# Patient Record
Sex: Male | Born: 1960 | Race: White | Hispanic: No | Marital: Married | Smoking: Never smoker
Health system: Southern US, Community
[De-identification: ages and names within clinical notes are randomized; demographics above are authoritative.]

## PROBLEM LIST (undated history)

## (undated) DIAGNOSIS — M1990 Unspecified osteoarthritis, unspecified site: Secondary | ICD-10-CM

## (undated) DIAGNOSIS — I10 Essential (primary) hypertension: Secondary | ICD-10-CM

## (undated) DIAGNOSIS — E119 Type 2 diabetes mellitus without complications: Secondary | ICD-10-CM

## (undated) HISTORY — DX: Essential (primary) hypertension: I10

## (undated) HISTORY — DX: Type 2 diabetes mellitus without complications: E11.9

## (undated) HISTORY — PX: KNEE SURGERY: SHX244

## (undated) HISTORY — PX: CARPAL TUNNEL RELEASE: SHX101

## (undated) HISTORY — PX: BACK SURGERY: SHX140

## (undated) HISTORY — PX: TOTAL KNEE ARTHROPLASTY: SHX125

---

## 2001-06-27 ENCOUNTER — Encounter (INDEPENDENT_AMBULATORY_CARE_PROVIDER_SITE_OTHER): Payer: Self-pay | Admitting: Specialist

## 2001-06-27 ENCOUNTER — Observation Stay (HOSPITAL_COMMUNITY): Admission: RE | Admit: 2001-06-27 | Discharge: 2001-06-28 | Payer: Self-pay | Admitting: Orthopedic Surgery

## 2003-11-11 ENCOUNTER — Ambulatory Visit (HOSPITAL_BASED_OUTPATIENT_CLINIC_OR_DEPARTMENT_OTHER): Admission: RE | Admit: 2003-11-11 | Discharge: 2003-11-11 | Payer: Self-pay | Admitting: Orthopedic Surgery

## 2003-11-11 ENCOUNTER — Ambulatory Visit (HOSPITAL_COMMUNITY): Admission: RE | Admit: 2003-11-11 | Discharge: 2003-11-11 | Payer: Self-pay | Admitting: Orthopedic Surgery

## 2005-01-12 ENCOUNTER — Emergency Department (HOSPITAL_COMMUNITY): Admission: EM | Admit: 2005-01-12 | Discharge: 2005-01-12 | Payer: Self-pay | Admitting: Family Medicine

## 2006-07-12 ENCOUNTER — Emergency Department (HOSPITAL_COMMUNITY): Admission: EM | Admit: 2006-07-12 | Discharge: 2006-07-12 | Payer: Self-pay | Admitting: Emergency Medicine

## 2006-08-02 ENCOUNTER — Emergency Department (HOSPITAL_COMMUNITY): Admission: EM | Admit: 2006-08-02 | Discharge: 2006-08-02 | Payer: Self-pay | Admitting: Emergency Medicine

## 2006-12-21 ENCOUNTER — Encounter: Admission: RE | Admit: 2006-12-21 | Discharge: 2006-12-21 | Payer: Self-pay | Admitting: Orthopedic Surgery

## 2006-12-27 ENCOUNTER — Ambulatory Visit (HOSPITAL_COMMUNITY): Admission: RE | Admit: 2006-12-27 | Discharge: 2006-12-28 | Payer: Self-pay | Admitting: Orthopedic Surgery

## 2008-10-10 ENCOUNTER — Emergency Department (HOSPITAL_BASED_OUTPATIENT_CLINIC_OR_DEPARTMENT_OTHER): Admission: EM | Admit: 2008-10-10 | Discharge: 2008-10-10 | Payer: Self-pay | Admitting: Emergency Medicine

## 2008-10-10 ENCOUNTER — Ambulatory Visit: Payer: Self-pay | Admitting: Diagnostic Radiology

## 2009-07-13 ENCOUNTER — Inpatient Hospital Stay (HOSPITAL_COMMUNITY): Admission: RE | Admit: 2009-07-13 | Discharge: 2009-07-17 | Payer: Self-pay | Admitting: Orthopedic Surgery

## 2010-02-11 ENCOUNTER — Inpatient Hospital Stay (HOSPITAL_COMMUNITY)
Admission: RE | Admit: 2010-02-11 | Discharge: 2010-02-16 | Payer: Self-pay | Source: Home / Self Care | Attending: Orthopedic Surgery | Admitting: Orthopedic Surgery

## 2010-02-11 LAB — TYPE AND SCREEN
ABO/RH(D): A POS
Antibody Screen: NEGATIVE

## 2010-02-21 LAB — BASIC METABOLIC PANEL
BUN: 9 mg/dL (ref 6–23)
CO2: 29 mEq/L (ref 19–32)
Calcium: 8.8 mg/dL (ref 8.4–10.5)
Chloride: 97 mEq/L (ref 96–112)
Creatinine, Ser: 0.99 mg/dL (ref 0.4–1.5)
GFR calc Af Amer: >60 mL/min (ref >60)
GFR calc non Af Amer: >60 mL/min (ref >60)
Glucose, Bld: 112 mg/dL — ABNORMAL HIGH (ref 70–99)
Potassium: 3.9 mEq/L (ref 3.5–5.1)
Sodium: 135 mEq/L (ref 135–145)

## 2010-02-21 LAB — CBC
HCT: 38.8 % — ABNORMAL LOW (ref 39.0–52.0)
HCT: 39.2 % (ref 39.0–52.0)
HCT: 35.2 % — ABNORMAL LOW (ref 39.0–52.0)
Hemoglobin: 12.9 g/dL — ABNORMAL LOW (ref 13.0–17.0)
Hemoglobin: 12.7 g/dL — ABNORMAL LOW (ref 13.0–17.0)
Hemoglobin: 11.8 g/dL — ABNORMAL LOW (ref 13.0–17.0)
MCH: 29 pg (ref 26.0–34.0)
MCH: 28.5 pg (ref 26.0–34.0)
MCH: 28.8 pg (ref 26.0–34.0)
MCHC: 33.2 g/dL (ref 30.0–36.0)
MCHC: 32.4 g/dL (ref 30.0–36.0)
MCHC: 33.5 g/dL (ref 30.0–36.0)
MCV: 87.2 fL (ref 78.0–100.0)
MCV: 87.9 fL (ref 78.0–100.0)
MCV: 85.9 fL (ref 78.0–100.0)
Platelets: 201 10*3/uL (ref 150–400)
Platelets: 174 10*3/uL (ref 150–400)
Platelets: 179 10*3/uL (ref 150–400)
RBC: 4.45 MIL/uL (ref 4.22–5.81)
RBC: 4.46 MIL/uL (ref 4.22–5.81)
RBC: 4.1 MIL/uL — ABNORMAL LOW (ref 4.22–5.81)
RDW: 13.7 % (ref 11.5–15.5)
RDW: 13.7 % (ref 11.5–15.5)
RDW: 13.7 % (ref 11.5–15.5)
WBC: 13 10*3/uL — ABNORMAL HIGH (ref 4.0–10.5)
WBC: 14 10*3/uL — ABNORMAL HIGH (ref 4.0–10.5)
WBC: 15.3 10*3/uL — ABNORMAL HIGH (ref 4.0–10.5)

## 2010-02-21 LAB — PROTIME-INR
INR: 1.15 (ref 0.00–1.49)
INR: 1.17 (ref 0.00–1.49)
INR: 1.52 — ABNORMAL HIGH (ref 0.00–1.49)
INR: 1.74 — ABNORMAL HIGH (ref 0.00–1.49)
INR: 1.92 — ABNORMAL HIGH (ref 0.00–1.49)
INR: 2.58 — ABNORMAL HIGH (ref 0.00–1.49)
Prothrombin Time: 14.9 seconds (ref 11.6–15.2)
Prothrombin Time: 15.1 seconds (ref 11.6–15.2)
Prothrombin Time: 18.5 seconds — ABNORMAL HIGH (ref 11.6–15.2)
Prothrombin Time: 20.5 seconds — ABNORMAL HIGH (ref 11.6–15.2)
Prothrombin Time: 22.1 seconds — ABNORMAL HIGH (ref 11.6–15.2)
Prothrombin Time: 27.8 seconds — ABNORMAL HIGH (ref 11.6–15.2)

## 2010-03-14 NOTE — Discharge Summary (Signed)
Seth Larsen, Seth Larsen               ACCOUNT NO.:  [id number]  MEDICAL RECORD NO.:  [id number]          PATIENT TYPE:  INP  LOCATION:  1617                         FACILITY:  [facility]  PHYSICIAN:  Marlowe Kays, M.D.  DATE OF BIRTH:  November 30, 1960  DATE OF ADMISSION:  02/11/2010 DATE OF DISCHARGE:  02/16/2010                              DISCHARGE SUMMARY   ADMITTING DIAGNOSES:  Osteoarthritis of the right knee.  DISCHARGE DIAGNOSIS: 1. Osteoarthritis of the right knee. 2. Mild postoperative anemia.  OPERATION:  On February 11, 2009, the patient underwent an [employer] total knee replacement arthroplasty of the right knee.  Dr. Georges Lynch. Gioffre assisted.  BRIEF HISTORY:  This 50 year old male has had problems concerning his right knee.  He had an arthroplasty done in June of last year and did very well with that.  His left knee is doing well after his arthroscopy; however, his right knee is interfering with his day-to-day activities. He has lost flexion in the knee and can only flex to about 90 degrees. He has used muscle relaxants, anti-inflammatories with analgesics, which really has not helped at all.  We used viscosupplementation as one of our conservative care measures, but unfortunately, this did not help.  X- rays have shown near bone-on-bone deformities.  After much discussion, including the risks and benefits of the surgery, we decided to go ahead with the above procedure.  COURSE IN HOSPITAL:  The patient tolerated the surgical procedure quite well.  He was placed on CPM machine postoperatively, which he tolerated quite well.  He was very eager to enter into his physical therapy regimen postoperatively for rehabilitation of his knee.  We did have a little difficulty with some pain control.  We had to adjust medications. Eventually with time and changing the medication helped him postoperatively.  Physical therapy worked very diligently with the patient in the total knee  protocol.  He was placed on Coumadin protocol after receiving Lovenox postoperatively for prevention of DVT.  His wound remained clean and dry.  He was afebrile.  Once we had his pain under control and he was comfortable and confident with his knee and able to ambulate, his IV analgesics were discontinued.  On the day of discharge,  Dr. Simonne Come saw the patient, and he had much less pain and discomfort.  Home arrangements have been made for home health.  Dr. Simonne Come discharged him home on Coumadin to continue with the Coumadin protocol,  Tylox for discomfort, Robaxin as a muscle relaxant, and Zofran for any nausea. Laboratory values in the hospital hematologically showed a preoperative CBC completely within normal limits.  Hemoglobin 12.9, hematocrit 38.8. Final hemoglobin was 11.8 and hematocrit was 35.2.  Blood chemistries were within normal limits with a sodium of 135, potassium of 3.9.  The MRSA screen was negative.  CONDITION ON DISCHARGE:  Improved, stable.  PLAN:  The patient discharged to his home in the care of his family to continue with home health and total knee protocol.  His medications that he had at home preoperatively,  hydrocodone, ibuprofen, multivitamins and meloxicam will not be restarted postoperatively due to the fact  that he is on Coumadin protocol.  We might like to have him back in the office in 2 weeks after the date of surgery.  On the date of surgery, his wound was clean and dry.  Staples were intact. Neurovascular was intact to the operative extremity.  He is told should there be any changes in the above, he is to give [place] a call prior to him coming back to the office in 2 weeks.     Dooley L. Cherlynn June.   ______________________________ Marlowe Kays, M.D.    DLU/MEDQ  D:  03/02/2010  T:  03/02/2010  Job:  [id number]  Electronically Signed by Marlowe Kays M.D. on 03/14/2010 02:47:40 PM

## 2010-04-18 LAB — SURGICAL PCR SCREEN
MRSA, PCR: NEGATIVE
Staphylococcus aureus: NEGATIVE

## 2010-04-25 LAB — TYPE AND SCREEN
ABO/RH(D): A POS
Antibody Screen: NEGATIVE

## 2010-04-25 LAB — PROTIME-INR
INR: 1.12 (ref 0.00–1.49)
INR: 1.14 (ref 0.00–1.49)
INR: 1.37 (ref 0.00–1.49)
INR: 2.17 — ABNORMAL HIGH (ref 0.00–1.49)
Prothrombin Time: 14.3 seconds (ref 11.6–15.2)
Prothrombin Time: 14.5 seconds (ref 11.6–15.2)
Prothrombin Time: 24 seconds — ABNORMAL HIGH (ref 11.6–15.2)

## 2010-04-25 LAB — BASIC METABOLIC PANEL
Calcium: 8.3 mg/dL — ABNORMAL LOW (ref 8.4–10.5)
Calcium: 8.5 mg/dL (ref 8.4–10.5)
Chloride: 98 mEq/L (ref 96–112)
Creatinine, Ser: 0.81 mg/dL (ref 0.4–1.5)
GFR calc Af Amer: >60 mL/min (ref >60)
GFR calc Af Amer: >60 mL/min (ref >60)
GFR calc non Af Amer: >60 mL/min (ref >60)
Sodium: 133 mEq/L — ABNORMAL LOW (ref 135–145)

## 2010-04-25 LAB — CBC
HCT: 33.4 % — ABNORMAL LOW (ref 39.0–52.0)
Hemoglobin: 11.5 g/dL — ABNORMAL LOW (ref 13.0–17.0)
Hemoglobin: 12 g/dL — ABNORMAL LOW (ref 13.0–17.0)
Platelets: 157 10*3/uL (ref 150–400)
RBC: 4.01 MIL/uL — ABNORMAL LOW (ref 4.22–5.81)
RBC: 4.36 MIL/uL (ref 4.22–5.81)
WBC: 13.1 10*3/uL — ABNORMAL HIGH (ref 4.0–10.5)
WBC: 15.5 10*3/uL — ABNORMAL HIGH (ref 4.0–10.5)
WBC: 15.6 10*3/uL — ABNORMAL HIGH (ref 4.0–10.5)

## 2010-04-25 LAB — HEMOGLOBIN AND HEMATOCRIT, BLOOD: HCT: 46.2 % (ref 39.0–52.0)

## 2010-04-25 LAB — ABO/RH: ABO/RH(D): A POS

## 2010-06-21 NOTE — Op Note (Signed)
NAME:  Seth Larsen, Seth Larsen               ACCOUNT NO.:  [id number]   MEDICAL RECORD NO.:  [id number]          PATIENT TYPE:  OIB   LOCATION:  1613                         FACILITY:  [facility]   PHYSICIAN:  Marlowe Kays, M.D.  DATE OF BIRTH:  09/07/1960   DATE OF PROCEDURE:  12/27/2006  DATE OF DISCHARGE:                               OPERATIVE REPORT   PREOPERATIVE DIAGNOSES:  Spinal stenosis, L3-4, and lateral recess  stenosis and possible disk protrusion at L4-5 right.   POSTOPERATIVE DIAGNOSES:  Spinal stenosis, L3-4, and lateral recess  stenosis and possible disk protrusion at L4-5 right.   OPERATIONS:  Central and foraminal decompression at L3-4 and L4-5 right.   SURGEON:  Marlowe Kays, M.D.   ASSISTANT:  Georges Lynch. Darrelyn Hillock, M.D.   ANESTHESIA:  General.   PATHOLOGY AND JUSTIFICATION FOR PROCEDURE:  He has had a history of two  laminectomies at L4-5 on the right in 1983 and 1991.  Recently has  developed severe right leg pain which has become progressively more  severe, only partially controlled by narcotics.  An MRI has demonstrated  postoperative changes at L5-S1 on the left where he has also had  previous surgery and at L4-5 on the right with what appeared to be  recurrent disk bulge at that level and some lateral recess stenosis.  The dominant finding on the MRI appeared to the spinal stenosis at L3-4.  Accordingly, a myelogram CT scan was performed which confirmed the  significant spinal stenosis at L3-4 with the defect more on the right at  L3-4 than the left and also some cut-off of the L5 nerve root on the  right.  Accordingly, it was felt that the above-mentioned surgery was  indicated.   PROCEDURE:  Prophylactic antibiotics, satisfactory general anesthesia,  knee-chest position on the Luverne frame.  Back was prepped with  DuraPrep, draped in a sterile field, Ioban employed.  Time-out  performed.  I excised the superior portion of the old scar and extended  the  incision slightly cephalad, tagging the spinous process at this  level, which we thought would probably be L4, and also placed Penfield #  hemostat distally at the inferior portion of the incision.  We took our  initial lateral, x-ray indicating that the hook was actually on L3 and  the distal Penfield at the interval between L4 and L5, almost at the  interspace.  Accordingly, I extended the incision slightly proximally  and distalward so that most of the entire L2-3 interspace was identified  as was well as the level of the L4-5 disk.  We then placed two self-  retaining McCullough retractors.  With a double-action rongeur I removed  the spinous process and neural arch of L3 and a major portion of L4  until we got down to the ligamentum flavum and close to the spinal canal  and then we continued working with 2 and 3-mm Kerrison rongeurs.  The  spinal canal was extremely tight at L3-4.  When the dissection became  more difficult, we brought in the microscope and completed the  decompression until he was  well-decompressed, cephalad caudad and  laterally.  We took a final x-ray, a lateral x-ray confirming that we  __________  gone above L3 and distal to the L4-5 disk space.  It was  felt we identified the L5 nerve root.  There was a good bit of scar  present, but we were at the level of the disk space and it was firm.  Also, the foramen for the L5 nerve root was patent to hockey stick on  the right side.  Satisfied that we had completed the decompression, the  wound was irrigated well with sterile saline and Gelfoam soaked in  thrombin was placed over the dura.  The self-retaining retractors were  carefully removed and the wound closed in layers with interrupted #1  Vicryl in the fascia leaving a distal aperture of about a centimeter and  a half for any drainage.  The subcutaneous tissue was closed with #1  Vicryl deep and superficially with 2-0 Vicryl, and the skin with  staples.  Betadine  and Adaptic dry sterile were applied.  He tolerated  the procedure well and was gently placed on his PACU bed and taken to  recovery in satisfactory condition with no known complications.  Estimated blood loss was perhaps 250 mL, no blood replacement.           ______________________________  Marlowe Kays, M.D.     JA/MEDQ  D:  12/27/2006  T:  12/28/2006  Job:  [id number]

## 2010-06-24 NOTE — Op Note (Signed)
NAME:  Seth Larsen, Seth Larsen               ACCOUNT NO.:  [id number]   MEDICAL RECORD NO.:  [id number]          PATIENT TYPE:  AMB   LOCATION:  DSC                          FACILITY:  [facility]   PHYSICIAN:  Marlowe Kays, M.D.  DATE OF BIRTH:  07-15-1960   DATE OF PROCEDURE:  11/11/2003  DATE OF DISCHARGE:                                 OPERATIVE REPORT   PREOPERATIVE DIAGNOSES:  1.  Torn medial meniscus.  2.  Degenerative arthritis medial compartment of the knee joint.   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   OPERATION PERFORMED:  Left knee arthroscopy with incidental shaving of  medial meniscus and debridement of medial femoral condyle and patella.   SURGEON:  Marlowe Kays, M.D.   ASSISTANT:  Nurse.   ANESTHESIA:  General.   INDICATIONS FOR PROCEDURE:  Because of persistent pain in the inner aspect  of his left knee, I obtained an MRI which was performed on October 21, 2003 and showed a posterior horn tear of the medial meniscus with medial  displacement.  There was also come medial compartment arthritis noted.  Because of the medial meniscal tear he is here today for the arthroscopic  procedure.  See operative description below for additional details on  pathology.   DESCRIPTION OF PROCEDURE:  Ace wrap to right lower extremity and padding on  the right knee.  Pneumatic tourniquet with the left leg Esmarched out  sterilely.  Thigh stabilizer.  Left leg was prepped with DuraPrep from  stabilizer to ankle and draped in sterile field.  Superomedial saline  inflow.  First through anterolateral portal, the medial compartment of knee  joint was evaluated, had some synovitis medially which I resected.  He had a  defect in the midportion of the medial tibial plateau I would rate grade 2/4  which I attempted to debride but was  really not debridable.  He had grade  3/4 chondromalacia over most of the medial femoral condyle which I shaved  down until smooth.  There was some slight fraying of the inner border of the  medial meniscus which I incidentally smoothed down.  There was no frank  tear.  I looked both on top of the medial meniscus and on the underneath  surface back to the posteromedial curve where the tear was described.  There  was a little synovitis around the posterior rim of the medial tibial plateau  but this did not involve the medial meniscus.  When I satisfied myself that  there was no medial meniscus tear, I looked up in the medial gutter and  suprapatellar area.  He had grade 3/4 chondromalacia of the midportion of  the patella which I was unable to get with my shaver from the medial  compartment of the knee joint.  Accordingly, I reversed portals and was able  to shave down the patella until smooth through the anterolateral portal.  Medial compartment of the knee joint looked unremarkable.  Representative  pictures were taken.  The knee joint was irrigated until clear.  All fluid  possible removed.  The two anterior portals were closed with 4-0 nylon.  20  mL of 0.5% Marcaine with Adrenalin with 4 mg of morphine were then instilled  through the inflow apparatus which  was then removed and this portal closed with 4-0 nylon as well.  Betadine  Adaptic, dry sterile dressing were applied.  Tourniquet released.  The  patient tolerated the procedure well and was taken to recovery room in  satisfactory condition with no known complications.       JA/MEDQ  D:  11/11/2003  T:  11/11/2003  Job:  [id number]

## 2010-06-24 NOTE — Op Note (Signed)
[facility]  Patient:    Seth Larsen, Seth Larsen Visit Number: [id number] MRN: [id number]          Service Type: SUR Location: 4W [id number] [id number] Attending Physician:  Marlowe Kays Page Dictated by:   Illene Labrador. Aplington, M.D. Proc. Date: 06/27/01 Admit Date:  06/27/2001 Discharge Date: 06/28/2001                             Operative Report  PREOPERATIVE DIAGNOSES:  1. Large lipoma, right posterior thorax.  2. Painful degenerative arthritis, right AC joint.  3. 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  4. Labral disruption.  POSTOPERATIVE DIAGNOSES:  1. Large lipoma, right posterior thorax.  2. Painful degenerative arthritis, right AC joint.  3. 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  4. Labral disruption.  OPERATION PERFORMED:  1. Excision of large lipoma, right posterior thorax.  2. Open resection, distal right clavicle.  3. Right shoulder arthroscopy with a) debridement of labral disruption,     b) arthroscopic subacromial decompression.  SURGEON:  Illene Labrador. Aplington, M.D.  ASSISTANT:  Marcie Bal. Troncale, P.A.C.  ANESTHESIA:  General.  PATHOLOGY AND JUSTIFICATION FOR PROCEDURE:  Both the lipoma on his back and his right shoulder were very painful. MRI demonstrated what appeared to be a benign looking lipoma in his back, and significant degenerative arthritis with edema at the [facility] joint and rotator cuff disruption with labral disruption near the biceps tendon, all of this was confirmed at surgery.  DESCRIPTION OF PROCEDURE:  Satisfactory general anesthesia, the patient was placed in the prone position and the area of the lipoma of the back was prepped with duraprep, draped out with four towels and Ioban and then a lap sheet. The mass was infiltrated with 0.5% Marcaine with adrenaline, a transverse incision was made down to the subcutaneous tissue. The deep subcutaneous tissue was all matted and stuck down  to the underlying lipoma due to chronic irritation and inflammation presumably. I dissected out the perimeter of the lipoma which did appear to be benign and I had to dissect it off the deep muscle. It was adherent but not invading the muscle. More inferiorly, the inflamed type tissue in the deep subcutaneous tissue was adherent to the lipoma and was taken out with it as well. The final specimen was in two pieces with a total of 9 cm in diameter. Minor bleeders were coagulated and the wound was dry on closure. I felt that there was not a lot of dead space since I could contain a deep subcuticular closure so no drain was placed. With subcutaneous closure with interrupted 2-0 Vicryl, deep 3-0 Vicryl superficially and Steri-Strips on the skin. A dry sterile dressing was applied. He was then rolled over onto another OR bed with a Schlein frame and placed in the beach chair position and the right shoulder girdle was prepped with duraprep and draped in a sterile field. The anatomy of the shoulder joint was marked out including the distal clavicle, the [facility] joint, the coracoid and lateral and posterior soft spot portals. He had had interscalene block by anesthesia but I still used Marcaine with adrenaline for hemostatic properties infiltrating the distal clavicle incision site in the lateral and posterior portals in the subacromial space. First through the posterior stab wound in the posterior soft spot, I atraumatically entered the glenohumeral joint with a blunt trocar. He had a good bit of labral disruption with what appeared  to be some redundancy of the labrum adjacent to the biceps tendon, but otherwise the joint looked good with normal looking biceps tendon, humeral head and underneath surface of the rotator cuff. I advanced the scope anteriorly in between the biceps and the subscapularis and used a switching stick and made an anterior incision on which I placed a metal cannula and 4.2 shaver  and was then able to shave and debride the labral disruption down to smooth small residual rim. It was not torn off the glenoid. I then evacuated all fluid possible from the glenohumeral joint and redirected the scope in the subacromial area and made a lateral portal with a blunt trocar followed by a 4.2 shaver. He did not have a lot of subdeltoid bursa and after removing what I needed to for visualization purposes, I placed an arthrotec vaporizer and began removing soft tissue from the underneath surface of the acromion and the coracoacromial ligament. He had a very tight subacromial space and a picture was taken. When I had adequate working room, I introduced the 4.0 oval bur and began burring down in the underneath surface of the acromion and went back and forth between the bur and the arthrotec vaporizer until I was satisfied that the decompression had been completed. This was checked with the arm abducted and final pictures were taken. I then removed all fluid possible from the subacromial space and I made an incision over the distal clavicle and laterally over the [facility] joint. We subperiosteal dissection partially with cautery and partly with a small elevator, I exposed the lateral clavicle and measured about a centimeter and a half to two centimeters and then gently undermined the clavicle at this point and placed a baby Homans and used the microsaw to cut the clavicle. I grasped the lateral end with a towel clip and dissected it out with cautery. Several small spicules of bone were removed from the cut portion on the distal clavicle and then covered in some bone wax. After irrigating the wound well, I placed Gelfoam filling the gap from the clavicle resection and then closed the fascial periosteal complex with interrupted #1 Vicryl, subcutaneous tissue with 2-0 and 3-0 Vicryl and Steri-Strips on the skin. The three portals were closed with interrupted 4-0 nylon. Betadine and Adaptic  were placed over the portals, a dry sterile dressing and shoulder immobilizer to the arm. He tolerated the procedure well  and at the time of this dictation was on his way to the recovery room in satisfactory condition with no known complications. Dictated by:   Illene Labrador. Aplington, M.D. Attending Physician:  Joaquin Courts DD:  06/27/01 TD:  07/01/01 Job: [id number]

## 2010-06-28 ENCOUNTER — Encounter (HOSPITAL_COMMUNITY): Payer: 59

## 2010-06-28 ENCOUNTER — Other Ambulatory Visit: Payer: Self-pay | Admitting: Orthopedic Surgery

## 2010-06-28 LAB — CBC
MCV: 82 fL (ref 78.0–100.0)
Platelets: 236 10*3/uL (ref 150–400)
RDW: 14.4 % (ref 11.5–15.5)
WBC: 9 10*3/uL (ref 4.0–10.5)

## 2010-07-06 ENCOUNTER — Ambulatory Visit (HOSPITAL_COMMUNITY): Payer: 59

## 2010-07-06 ENCOUNTER — Ambulatory Visit (HOSPITAL_COMMUNITY)
Admission: AD | Admit: 2010-07-06 | Discharge: 2010-07-08 | Disposition: A | Discharge status: Home / Self Care | Payer: 59 | Source: Ambulatory Visit | Attending: Orthopedic Surgery | Admitting: Orthopedic Surgery

## 2010-07-06 DIAGNOSIS — M5126 Other intervertebral disc displacement, lumbar region: Secondary | ICD-10-CM | POA: Insufficient documentation

## 2010-07-06 DIAGNOSIS — M25559 Pain in unspecified hip: Secondary | ICD-10-CM | POA: Insufficient documentation

## 2010-07-06 DIAGNOSIS — IMO0002 Reserved for concepts with insufficient information to code with codable children: Secondary | ICD-10-CM | POA: Insufficient documentation

## 2010-07-06 DIAGNOSIS — Z79899 Other long term (current) drug therapy: Secondary | ICD-10-CM | POA: Insufficient documentation

## 2010-07-06 DIAGNOSIS — E669 Obesity, unspecified: Secondary | ICD-10-CM | POA: Insufficient documentation

## 2010-07-06 DIAGNOSIS — Z96659 Presence of unspecified artificial knee joint: Secondary | ICD-10-CM | POA: Insufficient documentation

## 2010-07-06 DIAGNOSIS — G9741 Accidental puncture or laceration of dura during a procedure: Secondary | ICD-10-CM | POA: Insufficient documentation

## 2010-07-06 DIAGNOSIS — M7130 Other bursal cyst, unspecified site: Secondary | ICD-10-CM | POA: Insufficient documentation

## 2010-07-06 DIAGNOSIS — M79609 Pain in unspecified limb: Secondary | ICD-10-CM | POA: Insufficient documentation

## 2010-07-06 DIAGNOSIS — M25569 Pain in unspecified knee: Secondary | ICD-10-CM | POA: Insufficient documentation

## 2010-07-12 NOTE — Op Note (Signed)
NAME:  Seth Larsen, Seth Larsen               ACCOUNT NO.:  [id number]  MEDICAL RECORD NO.:  [id number]           PATIENT TYPE:  O  LOCATION:  1602                         FACILITY:  [facility]  PHYSICIAN:  Marlowe Kays, M.D.  DATE OF BIRTH:  Dec 26, 1960  DATE OF PROCEDURE:  07/06/2010 DATE OF DISCHARGE:                              OPERATIVE REPORT   PREOPERATIVE DIAGNOSES:  Severe right leg pain secondary to free fragment disk material L3-L4 on the right and possible small recurrent disk herniation at L4-L5 on the right.  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  FINAL INDICATION FOR PROCEDURE:  He had a total knee replacement done by me roughly 3 months ago.  During the postoperative period, he develops pain after excise of the right lateral hip and then because of pain down around the knee and in the right calf.  Workup has demonstrated no indication of the pain is due to the total knee and since he has had prior history of a number of back surgeries, lumbar MRI was obtained with and without contrast on June 01, 2010, which showed a moderate disk bulge at L3-L4, which is felt to be a large superimposed right lateral and posterior lateral extrusion extending caudally resulting in thecal sac deformity, severe right lateral recess stenosis and displacement in the right L4 nerve root.  At L4-L5, there is a mild disk bulge with a small central extrusion, which was new.  There was a small indentation of the thecal sac.  The plan was to reexplore him with L3-L4 and L4-L5 on the right and retrieve the extruded disk fragment caudal to L3-L4 and also possibly enter the L4-L5 interspace as well.  PROCEDURE IN DETAIL:  Prophylactic antibiotics, satisfactory general anesthesia, was placed in the prone position on rolls to protect his knee replacements.  Back was prepped with DuraPrep and with two spinal needles and  lateral x-ray, we tentatively localized the incisional area. Made my opening incision and tagged the proximal spinous process, which was as suspected L2, but this is right above the L3-L4 interspace. Also, marker indicated with the L4-L5 interspace was as well.  We then tediously and carefully began removing soft tissue off the lateral bone margins from L3 down to L5.  He previously had a central decompressive laminectomy at L3-L4 and hemilaminectomy x2 at L4-L5.  We took a number of localizing x-rays as we went and gradually we were able to work lateral to the scarred dura and when we got down to the level of the suspected disk fragment instead we found a large, lobulated cystic structure, which was white and appeared to be most consistent with a large synovial cyst.  Despite the scar, we were able to demarcate the L4 and L5 nerve roots and it appeared to be compressing both particularly the L4 against the lateral bone recess.  We worked far lateral-ward and after identifying the L3-L4 interspace, I ended up with Nicholos Johns four instrument, followed this by very small pituitary and just simply trying to remove a small amount of disk  material, however, the dura, which was stuck down to the superior portion of the interspace apparently had an adhesion there and we encountered very small dural tear.  There was no significant spinal leakage.  Working distally, we freed up the L5 nerve root as well and a hockey stick could be entered in the foramen with plenty of space.  I also entered the what appeared to be the L4-L5 interspace, but it was also the case of a scar.  Due to our experiences at L3-L4, since we found the what we felt was the pathology then answered all the questions about his MRI and his pain pattern, we elected to call a halt to the procedure at this time.  I used the DuraSeal over the dura at the suspected small dural tear site because no unusual bleeding after removing the  self-retaining retractors.  I closed para-lumbar fascia and muscle with interrupted #1 Vicryl and the same with the deep subcutaneous tissue and superficial subcutaneous tissue with 2-0 Vicryl, staples in the skin.  Dry sterile dressing was applied.  He tolerated the procedure well and was taken to the recovery room in satisfactory condition with minimal blood loss and one complication.          ______________________________ Marlowe Kays, M.D.     JA/MEDQ  D:  07/06/2010  T:  07/06/2010  Job:  [id number]  Electronically Signed by Marlowe Kays M.D. on 07/12/2010 01:25:34 PM

## 2010-11-15 LAB — HEMOGLOBIN AND HEMATOCRIT, BLOOD: Hemoglobin: 15.4

## 2010-11-24 LAB — DIFFERENTIAL
Basophils Absolute: 0.1
Basophils Relative: 1
Eosinophils Absolute: 0.1
Eosinophils Relative: 1
Lymphocytes Relative: 18
Lymphs Abs: 1.6
Monocytes Absolute: 1.1 — ABNORMAL HIGH
Monocytes Relative: 12 — ABNORMAL HIGH
Neutro Abs: 6.4
Neutrophils Relative %: 69

## 2010-11-24 LAB — CBC
HCT: 49.6
Hemoglobin: 16.9
MCHC: 34
MCV: 83.5
Platelets: 205
RBC: 5.94 — ABNORMAL HIGH
RDW: 13.5
WBC: 9.3

## 2010-11-24 LAB — I-STAT 8, (EC8 V) (CONVERTED LAB)
Acid-Base Excess: 1
BUN: 10
Bicarbonate: 25.8 — ABNORMAL HIGH
Chloride: 103
Glucose, Bld: 113 — ABNORMAL HIGH
HCT: 53 — ABNORMAL HIGH
Hemoglobin: 18 — ABNORMAL HIGH
Operator id: 247071
Potassium: 3.5
Sodium: 134 — ABNORMAL LOW
TCO2: 27
pCO2, Ven: 39.4 — ABNORMAL LOW
pH, Ven: 7.423 — ABNORMAL HIGH

## 2010-11-24 LAB — POCT URINALYSIS DIP (DEVICE)
Bilirubin Urine: NEGATIVE
Glucose, UA: NEGATIVE
Ketones, ur: NEGATIVE
Nitrite: NEGATIVE
Operator id: 239701
Protein, ur: 30 — AB
Specific Gravity, Urine: 1.015
Urobilinogen, UA: 0.2
pH: 6.5

## 2010-11-24 LAB — HEPATIC FUNCTION PANEL
ALT: 19
AST: 14
Albumin: 3.1 — ABNORMAL LOW
Alkaline Phosphatase: 26 — ABNORMAL LOW
Bilirubin, Direct: 0.2
Indirect Bilirubin: 0.3
Total Bilirubin: 0.5
Total Protein: 6.2

## 2010-11-24 LAB — POCT I-STAT CREATININE: Operator id: 247071

## 2010-11-24 LAB — LIPASE, BLOOD: Lipase: 20

## 2012-07-06 IMAGING — CR DG KNEE 1-2V PORT*R*
1 series · 2 of 2 positions shown · non-contrast
Comparison: 10/10/2008.

CLINICAL DATA: Status post right knee replacement.

PORTABLE RIGHT KNEE - 1-2 VIEW

[Series 1: AP · right · 2 of 2 slices shown]
[im 1/2]
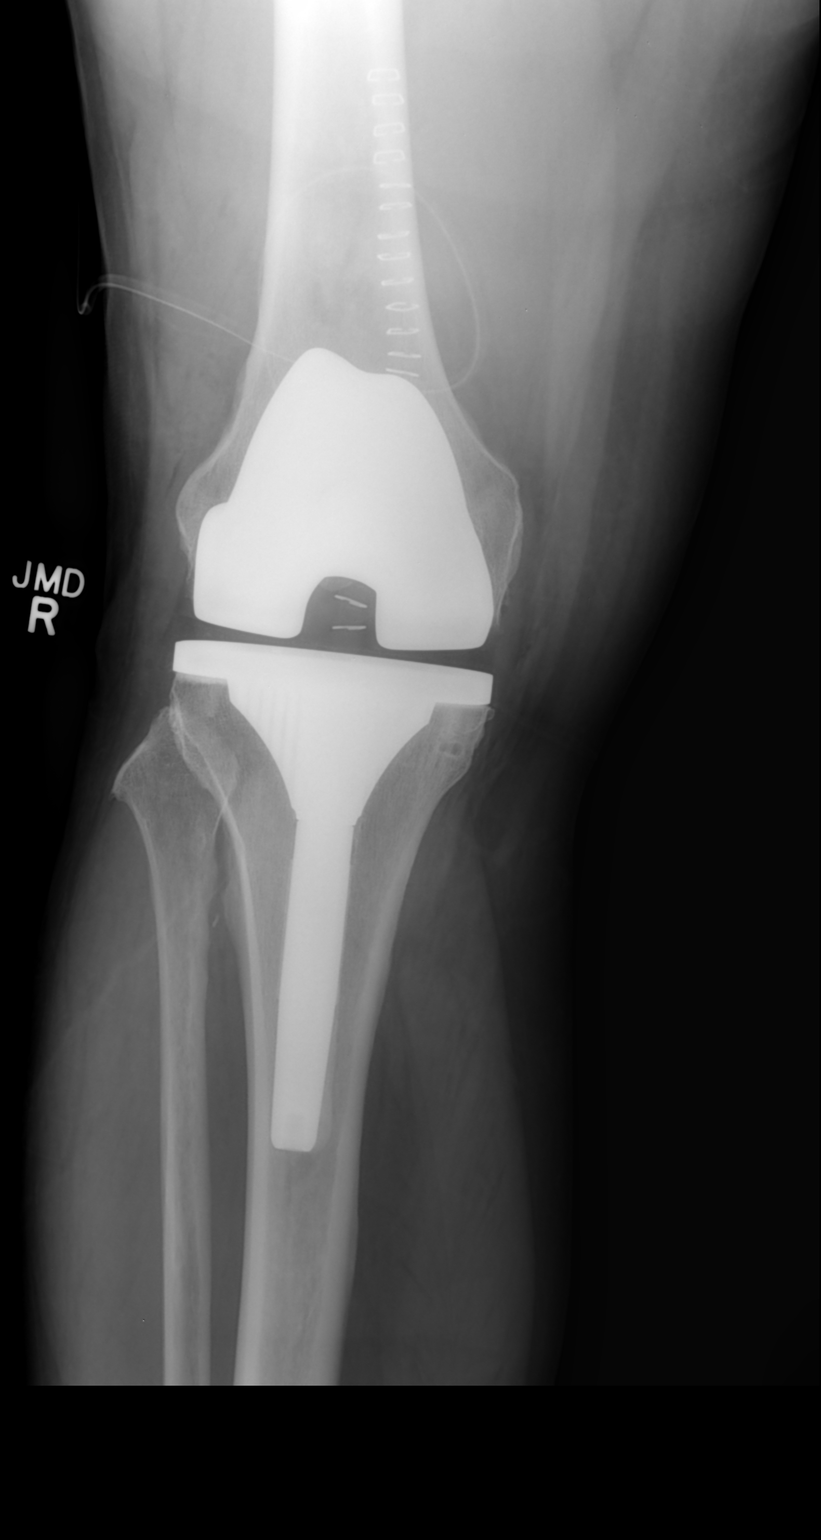
[im 2/2]
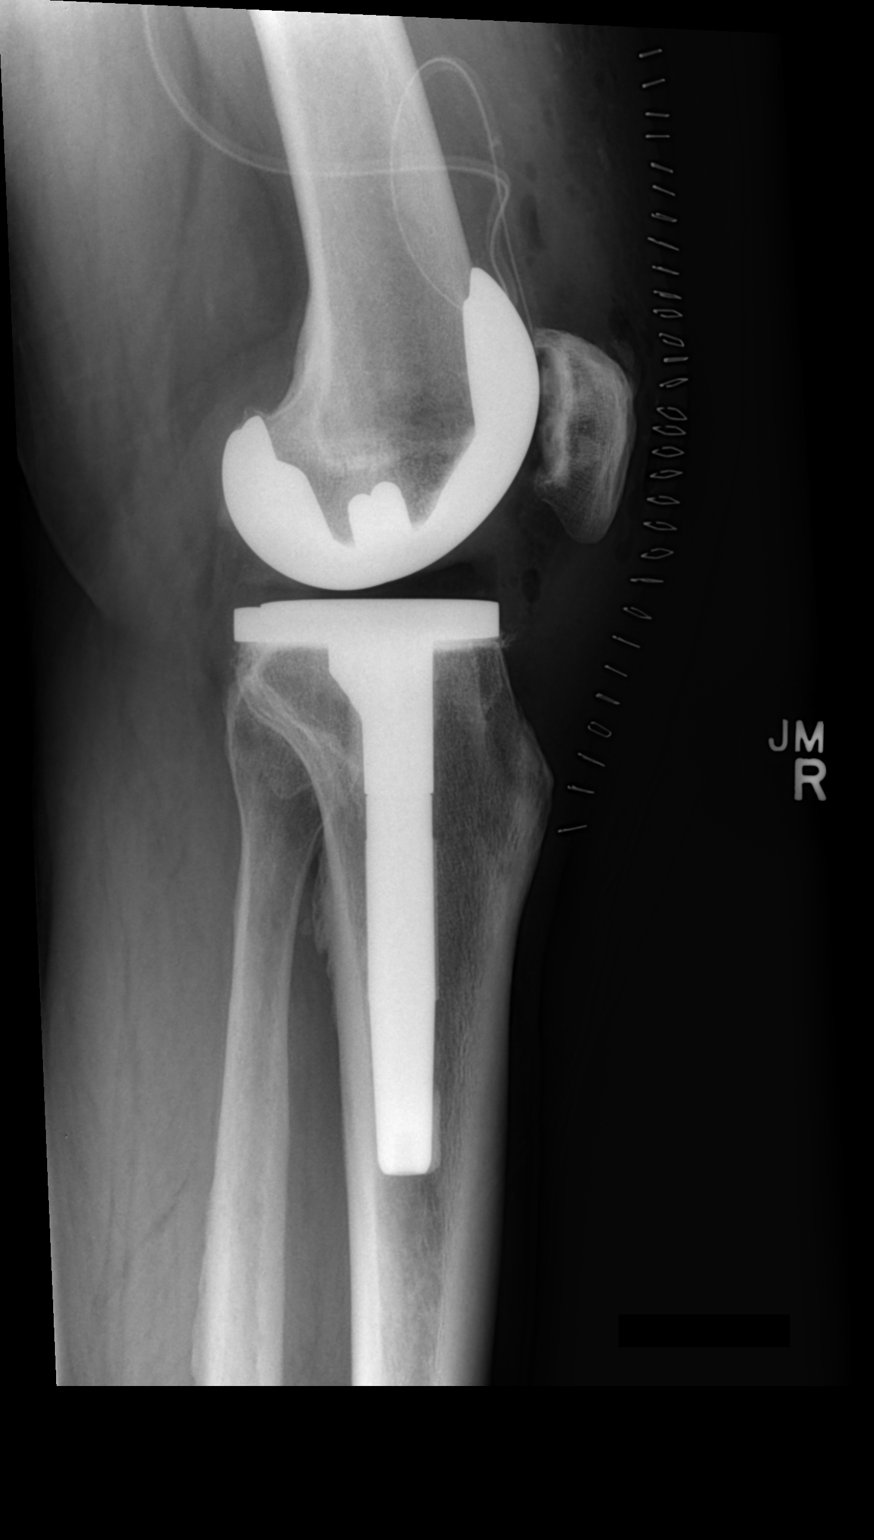

[2 of 2 positions shown; findings below may reference images not displayed]

FINDINGS: Interval right total knee prosthesis in satisfactory
position and alignment.  No fracture or dislocation seen.
Associated surgical drain and skin clips.
IMPRESSION: Satisfactory postoperative appearance of a right total knee
prosthesis.

## 2012-11-28 IMAGING — CR DG SPINE 1V PORT
1 series · 1 of 1 positions shown · non-contrast
Comparison: Same day

CLINICAL DATA: Microdiskectomy L4-5

Lumbar SPINE - 1 VIEW

[lateral]
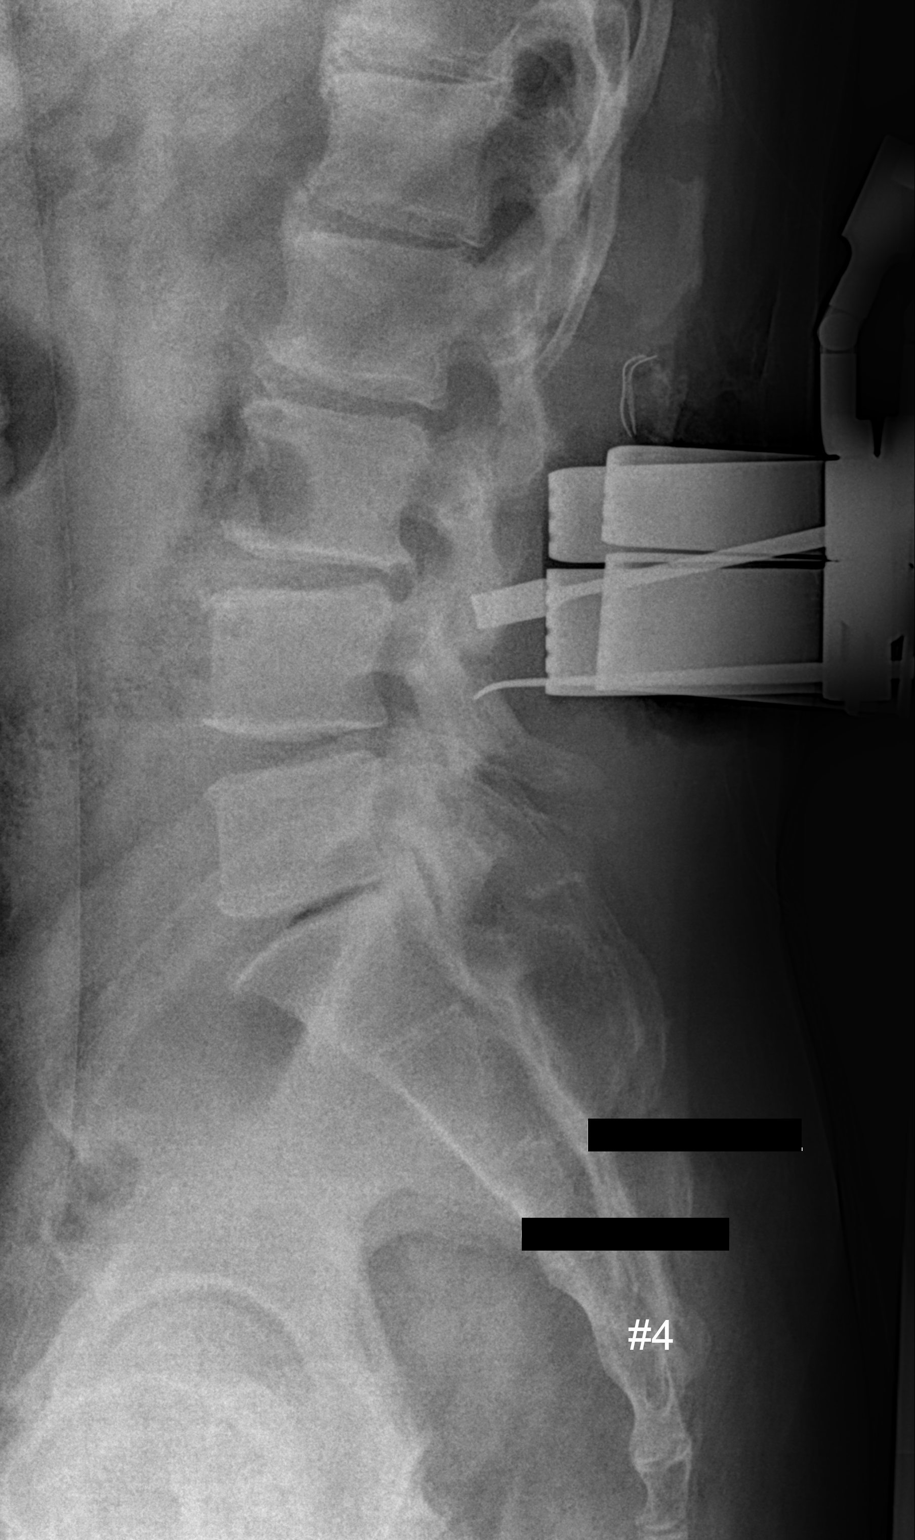

[1 of 1 positions shown; findings below may reference images not displayed]

FINDINGS: Tissue spreaders are in place posteriorly.  One probe is
in place directed at the pedicle level of L4.  A curved hemostat is
in place directed at the L4-5 disc and foraminal level.
IMPRESSION: L4-5 disc and foraminal level localized by the hemostat.  Second
instrument directed at the pedicle level of L4.

## 2015-06-30 ENCOUNTER — Ambulatory Visit (HOSPITAL_BASED_OUTPATIENT_CLINIC_OR_DEPARTMENT_OTHER)
Admission: RE | Admit: 2015-06-30 | Discharge: 2015-06-30 | Disposition: A | Discharge status: Home / Self Care | Payer: Managed Care, Other (non HMO) | Source: Ambulatory Visit | Attending: Family Medicine | Admitting: Family Medicine

## 2015-06-30 ENCOUNTER — Ambulatory Visit (INDEPENDENT_AMBULATORY_CARE_PROVIDER_SITE_OTHER): Payer: Managed Care, Other (non HMO) | Admitting: Family Medicine

## 2015-06-30 VITALS — BP 172/88 | HR 77 | Temp 97.8°F | Resp 20 | Wt 274.0 lb

## 2015-06-30 DIAGNOSIS — M1990 Unspecified osteoarthritis, unspecified site: Secondary | ICD-10-CM

## 2015-06-30 DIAGNOSIS — Z791 Long term (current) use of non-steroidal anti-inflammatories (NSAID): Secondary | ICD-10-CM | POA: Diagnosis not present

## 2015-06-30 DIAGNOSIS — R29810 Facial weakness: Secondary | ICD-10-CM | POA: Diagnosis not present

## 2015-06-30 DIAGNOSIS — R81 Glycosuria: Secondary | ICD-10-CM | POA: Diagnosis not present

## 2015-06-30 DIAGNOSIS — G8928 Other chronic postprocedural pain: Secondary | ICD-10-CM | POA: Diagnosis not present

## 2015-06-30 DIAGNOSIS — IMO0001 Reserved for inherently not codable concepts without codable children: Secondary | ICD-10-CM

## 2015-06-30 DIAGNOSIS — R7303 Prediabetes: Secondary | ICD-10-CM

## 2015-06-30 DIAGNOSIS — R030 Elevated blood-pressure reading, without diagnosis of hypertension: Secondary | ICD-10-CM | POA: Insufficient documentation

## 2015-06-30 DIAGNOSIS — G510 Bell's palsy: Secondary | ICD-10-CM

## 2015-06-30 LAB — COMPREHENSIVE METABOLIC PANEL
ALK PHOS: 34 U/L — AB (ref 40–115)
ALT: 21 U/L (ref 9–46)
AST: 18 U/L (ref 10–35)
Albumin: 4.1 g/dL (ref 3.6–5.1)
BUN: 13 mg/dL (ref 7–25)
CALCIUM: 8.9 mg/dL (ref 8.6–10.3)
CHLORIDE: 103 mmol/L (ref 98–110)
CO2: 24 mmol/L (ref 20–31)
Creat: 0.77 mg/dL (ref 0.70–1.33)
Glucose, Bld: 151 mg/dL — ABNORMAL HIGH (ref 65–99)
POTASSIUM: 4.7 mmol/L (ref 3.5–5.3)
Sodium: 137 mmol/L (ref 135–146)
TOTAL PROTEIN: 7.1 g/dL (ref 6.1–8.1)
Total Bilirubin: 0.4 mg/dL (ref 0.2–1.2)

## 2015-06-30 LAB — POCT SEDIMENTATION RATE: POCT SED RATE: 13 mm/h (ref 0–22)

## 2015-06-30 LAB — POCT URINALYSIS DIP (MANUAL ENTRY)
BILIRUBIN UA: NEGATIVE
BILIRUBIN UA: NEGATIVE
LEUKOCYTES UA: NEGATIVE
NITRITE UA: NEGATIVE
PH UA: 8
Protein Ur, POC: 30 — AB
Spec Grav, UA: 1.02
Urobilinogen, UA: 0.2

## 2015-06-30 LAB — POCT CBC
GRANULOCYTE PERCENT: 67 % (ref 37–80)
HCT, POC: 45.6 % (ref 43.5–53.7)
Hemoglobin: 15.9 g/dL (ref 14.1–18.1)
Lymph, poc: 2.5 (ref 0.6–3.4)
MCH: 28.8 pg (ref 27–31.2)
MCHC: 34.9 g/dL (ref 31.8–35.4)
MCV: 82.3 fL (ref 80–97)
MID (CBC): 0.9 (ref 0–0.9)
MPV: 8.1 fL (ref 0–99.8)
POC GRANULOCYTE: 6.9 (ref 2–6.9)
POC LYMPH PERCENT: 24.6 %L (ref 10–50)
POC MID %: 8.4 % (ref 0–12)
Platelet Count, POC: 186 10*3/uL (ref 142–424)
RBC: 5.54 M/uL (ref 4.69–6.13)
RDW, POC: 14 %
WBC: 10.3 10*3/uL — AB (ref 4.6–10.2)

## 2015-06-30 LAB — GLUCOSE, POCT (MANUAL RESULT ENTRY): POC Glucose: 145 mg/dl — AB (ref 70–99)

## 2015-06-30 LAB — POCT GLYCOSYLATED HEMOGLOBIN (HGB A1C): HEMOGLOBIN A1C: 6.2

## 2015-06-30 MED ORDER — PREDNISONE 20 MG PO TABS: ORAL_TABLET | ORAL | Status: DC

## 2015-06-30 MED ORDER — VALACYCLOVIR HCL 1 G PO TABS: 1000.0000 mg | ORAL_TABLET | Freq: Three times a day (TID) | ORAL | Status: DC

## 2015-06-30 MED ORDER — CLONIDINE HCL 0.1 MG PO TABS
0.1000 mg | ORAL_TABLET | Freq: Once | ORAL | Status: AC
  Administered 2015-06-30: 0.1 mg via ORAL

## 2015-06-30 MED FILL — predniSONE 20 MG TABS: 20 | 13 days supply | Qty: 38 | Fill #0

## 2015-06-30 MED FILL — valACYclovir HCL 1 GM TABS: 1 | 7 days supply | Qty: 21 | Fill #0

## 2015-06-30 NOTE — Progress Notes (Signed)
Subjective:  By signing my name below, I, Raven Small, attest that this documentation has been prepared under the direction and in the presence of Norberto Sorenson, MD.  Electronically Signed: Andrew Au, ED Scribe. 06/30/2015. 9:42 AM.   Patient ID: Seth Larsen, male    DOB: 14-Mar-1960, 55 y.o.   MRN: [id number]  HPI Chief Complaint  Patient presents with  . dropping of the face    right  . eye watering    x yesterday  . vision blurry in right eye    unable to blink it  . Unable to keep his mouth closed    HPI Comments: Seth Larsen is a 55 y.o. male who presents to the [facility] complaining of right facial droop noticed yesterday. Pt states yesterday while driving home from work the right side of his face, right mouth and right tongue felt "funny" or tingly.  Today he noticed right facial droop, right eye watering, and drooling while brushing his teeth this morning. He also reports dizziness, feeling off balanced for the past few days but has not felt syncopal. Pt also mentions having a tick bite underneath umbilicus and to left flank a couple days ago. He denies hx of TIA, CVA, HTN. He takes ibuprofen daily about 1-2 times a day for pain from past surgeries including right knee replacement and rupture disc. At one point he was taking ibuprofen 4-5 times a day in the past for pain. He denies feeling ill, rash, CP, SOB. Pt is right hand dominant.   There are no active problems to display for this patient.  History reviewed. No pertinent past medical history. History reviewed. No pertinent past surgical history. No Known Allergies Prior to Admission medications   Not on File   Review of Systems  Constitutional: Negative for fever and chills.  HENT: Positive for drooling. Negative for sore throat and trouble swallowing.   Eyes: Positive for discharge ( watering).  Respiratory: Negative for chest tightness and shortness of breath.   Cardiovascular: Negative for  chest pain.  Skin: Negative for rash.  Neurological: Positive for dizziness, facial asymmetry, weakness and numbness. Negative for syncope and light-headedness.    Objective:   Physical Exam  Constitutional: He is oriented to person, place, and time. He appears well-developed and well-nourished. No distress.  HENT:  Head: Normocephalic and atraumatic.  Right Ear: External ear normal.  Left Ear: External ear normal.  Nose: Nose normal.  Mouth/Throat: Oropharynx is clear and moist. No oropharyngeal exudate.  Normal palate rise. Tongue deviates very slightly to the left.   Eyes: Conjunctivae and EOM are normal. Pupils are equal, round, and reactive to light.  Neck: No thyromegaly present.  Cardiovascular: Normal rate, regular rhythm and normal heart sounds.   No murmur heard. Pulmonary/Chest: Effort normal and breath sounds normal. He has no wheezes. He has no rales.  Neurological: He is alert and oriented to person, place, and time. He displays a negative Romberg sign. Coordination and gait normal.  Reflex Scores:      Tricep reflexes are 1+ on the right side and 1+ on the left side.      Bicep reflexes are 0 on the right side and 1+ on the left side.      Brachioradialis reflexes are 0 on the right side and 1+ on the left side.      Patellar reflexes are 0 on the right side and 1+ on the left side.  Achilles reflexes are 2+ on the right side and 2+ on the left side. Negative pronator drift. Normal tandem gait. Occasional  areas of decreased sensation on right side extremities with decreased sensation in V2 and V3 distribution. Slight decrease in strength. 4+/5 on right cervical rotation and 5/5 on left. Normal rapid alternating movements. Normal finger to nose. Unable to heel to shin to due knee replacement.  Normal memory. Normal recall. Normal speech. Loss of facial movement on entire right side including forehead and eyelid drooping at rest. Unable to grimace, grin and wrinkle nose  and forehead on right normal on left. Abnormal reflex due to knee replacement and disc surgery. UE and LE strength 5/5 bilaterally.   Skin: No rash noted.  Nursing note and vitals reviewed.  Filed Vitals:   06/30/15 0938  BP: 178/90  Pulse: 77  Temp: 97.8 F (36.6 C)  TempSrc: Oral  Resp: 20  Weight: 274 lb (124.286 kg)  SpO2: 96%   Results for orders placed or performed in visit on 06/30/15  POCT CBC  Result Value Ref Range   WBC 10.3 (A) 4.6 - 10.2 K/uL   Lymph, poc 2.5 0.6 - 3.4   POC LYMPH PERCENT 24.6 10 - 50 %L   MID (cbc) 0.9 0 - 0.9   POC MID % 8.4 0 - 12 %M   POC Granulocyte 6.9 2 - 6.9   Granulocyte percent 67.0 37 - 80 %G   RBC 5.54 4.69 - 6.13 M/uL   Hemoglobin 15.9 14.1 - 18.1 g/dL   HCT, POC 25.9 56.3 - 53.7 %   MCV 82.3 80 - 97 fL   MCH, POC 28.8 27 - 31.2 pg   MCHC 34.9 31.8 - 35.4 g/dL   RDW, POC 87.5 %   Platelet Count, POC 186 142 - 424 K/uL   MPV 8.1 0 - 99.8 fL  POCT SEDIMENTATION RATE  Result Value Ref Range   POCT SED RATE 13 0 - 22 mm/hr  POCT urinalysis dipstick  Result Value Ref Range   Color, UA yellow yellow   Clarity, UA clear clear   Glucose, UA =250 (A) negative   Bilirubin, UA negative negative   Ketones, POC UA negative negative   Spec Grav, UA 1.020    Blood, UA trace-lysed (A) negative   pH, UA 8.0    Protein Ur, POC =30 (A) negative   Urobilinogen, UA 0.2    Nitrite, UA Negative Negative   Leukocytes, UA Negative Negative  POCT glucose (manual entry)  Result Value Ref Range   POC Glucose 145 (A) 70 - 99 mg/dl  POCT glycosylated hemoglobin (Hb A1C)  Result Value Ref Range   Hemoglobin A1C 6.2     EKG- normal sinus rhythm. Poor R wave progression in lateral chest leads.   Assessment & Plan:   1. Facial droop   2. Arthritis   3. Elevated blood pressure   4. NSAID long-term use   5. Chronic pain following surgery or procedure   6. Glucosuria   7. Prediabetes   8. Bell's palsy - reviewed diagnosis and treatment inc  eye care in detail to both pt and wife  Stat head CT - exam c/w bell's palsy but pt with multiple risk factors for CVA inc current hypertensive urgency vs emergency, no recent medical care, obesity, new diagnosis glucose intolerance and age.   Clonidine 0.1mg  given in office.  RTC for BP recheck in 2-3d and rec to start anti-hypertensives if still elev  at that time.  Pt w/o PCP. No recent medical care.  Stop overuse of nsaids. Check renal function  Orders Placed This Encounter  Procedures  . CT Head Wo Contrast    Standing Status: Future     Number of Occurrences: 1     Standing Expiration Date: 09/29/2016    Order Specific Question:  Reason for Exam (SYMPTOM  OR DIAGNOSIS REQUIRED)    Answer:  r/o CVA - right facial droop, uncontrolled HTN    Order Specific Question:  Preferred imaging location?    Answer:  [facility]  . Comprehensive metabolic panel    Order Specific Question:  Has the patient fasted?    Answer:  No  . Lyme Ab/Western Blot Reflex  . Orthostatic vital signs  . Care order/instruction    Compose work note per patient specification/AVS/Go Please ask pt to f/u in 2-3d, take prednisone as soon as he gets it, RTC sooner if worse.    Scheduling Instructions:     Compose work note per patient specifications/AVS/Go  . POCT CBC  . POCT SEDIMENTATION RATE  . POCT urinalysis dipstick  . POCT glucose (manual entry)  . POCT glycosylated hemoglobin (Hb A1C)  . EKG 12-Lead    Meds ordered this encounter  Medications  . cloNIDine (CATAPRES) tablet 0.1 mg    Sig:   . valACYclovir (VALTREX) 1000 MG tablet    Sig: Take 1 tablet (1,000 mg total) by mouth 3 (three) times daily.    Dispense:  21 tablet    Refill:  0  . predniSONE (DELTASONE) 20 MG tablet    Sig: 4 tabs po qam x 1 wk, then 3 tabs po qd x 1d, 2 tabs po qd x 2d, 1 tab po qd x 3d    Dispense:  38 tablet    Refill:  0    I personally performed the services described in this documentation, which was  scribed in my presence. The recorded information has been reviewed and considered, and addended by me as needed.  Norberto Sorenson, MD MPH

## 2015-06-30 NOTE — Patient Instructions (Signed)
Artificial tears are available without a prescription in liquid, gel, and ointment form. Liquid or gel formulations of artificial tears should be applied every hour while you are awake, and ointment formulations (eg, Soothe), which contain mineral oil and white petrolatum, should be used at night. Protective glasses or goggles should be worn during the day. Patches can be used at night, but tape should not be placed directly on the eyelid since the patch could slip and abrade the cornea.   Bell Palsy Bell palsy is a condition in which the muscles on one side of the face become paralyzed. This often causes one side of the face to droop. It is a common condition and most people recover completely. RISK FACTORS Risk factors for Bell palsy include:  Pregnancy.  Diabetes.  An infection by a virus, such as infections that cause cold sores. CAUSES  Bell palsy is caused by damage to or inflammation of a nerve in your face. It is unclear why this happens, but an infection by a virus may lead to it. Most of the time the reason it happens is unknown. SIGNS AND SYMPTOMS  Symptoms can range from mild to severe and can take place over a number of hours. Symptoms may include:  Being unable to:  Raise one or both eyebrows.  Close one or both eyes.  Feel parts of your face (facial numbness).  Drooping of the eyelid and corner of the mouth.  Weakness in the face.  Paralysis of half your face.  Loss of taste.  Sensitivity to loud noises.  Difficulty chewing.  Tearing up of the affected eye.  Dryness in the affected eye.  Drooling.  Pain behind one ear. DIAGNOSIS  Diagnosis of Bell palsy may include:  A medical history and physical exam.  An MRI.  A CT scan.  Electromyography (EMG). This is a test that checks how your nerves are working. TREATMENT  Treatment may include antiviral medicine to help shorten the length of the condition. Sometimes treatment is not needed and the  symptoms go away on their own. HOME CARE INSTRUCTIONS   Take medicines only as directed by your health care provider.  Do facial massages and exercises as directed by your health care provider.  If your eye is affected:  Use moisturizing eye drops to prevent drying of your eye as directed by your health care provider.  Protect your eye as directed by your health care provider. SEEK MEDICAL CARE IF:  Your symptoms do not get better or get worse.  You are drooling.  Your eye is red, irritated, or hurts. SEEK IMMEDIATE MEDICAL CARE IF:   Another part of your body feels weak or numb.  You have difficulty swallowing.  You have a fever along with symptoms of Bell palsy.  You develop neck pain. MAKE SURE YOU:   Understand these instructions.  Will watch your condition.  Will get help right away if you are not doing well or get worse.   This information is not intended to replace advice given to you by your health care provider. Make sure you discuss any questions you have with your health care provider.   Document Released: 01/23/2005 Document Revised: 10/14/2014 Document Reviewed: 05/02/2013 Elsevier Interactive Patient Education [employer]

## 2015-07-01 LAB — LYME AB/WESTERN BLOT REFLEX

## 2015-07-02 ENCOUNTER — Ambulatory Visit: Payer: Managed Care, Other (non HMO)

## 2015-07-02 ENCOUNTER — Telehealth: Payer: Self-pay | Admitting: Emergency Medicine

## 2015-07-06 ENCOUNTER — Encounter: Payer: Self-pay | Admitting: Primary Care

## 2015-07-06 ENCOUNTER — Ambulatory Visit (INDEPENDENT_AMBULATORY_CARE_PROVIDER_SITE_OTHER): Payer: Managed Care, Other (non HMO) | Admitting: Primary Care

## 2015-07-06 VITALS — BP 164/84 | HR 66 | Temp 98.0°F | Ht 67.5 in | Wt 263.4 lb

## 2015-07-06 DIAGNOSIS — R0683 Snoring: Secondary | ICD-10-CM

## 2015-07-06 DIAGNOSIS — M549 Dorsalgia, unspecified: Secondary | ICD-10-CM

## 2015-07-06 DIAGNOSIS — G8929 Other chronic pain: Secondary | ICD-10-CM

## 2015-07-06 DIAGNOSIS — I10 Essential (primary) hypertension: Secondary | ICD-10-CM | POA: Diagnosis not present

## 2015-07-06 DIAGNOSIS — G4733 Obstructive sleep apnea (adult) (pediatric): Secondary | ICD-10-CM | POA: Insufficient documentation

## 2015-07-06 MED ORDER — LISINOPRIL 10 MG PO TABS: 10.0000 mg | ORAL_TABLET | Freq: Every day | ORAL | Status: DC

## 2015-07-06 NOTE — Patient Instructions (Signed)
Start Lisinopril 10 mg tablets for high blood pressure. Take 1 tablet by mouth every morning.  Check your blood pressure daily, around the same time of day, for the next 2-3 weeks.   Ensure that you have rested for 30 minutes prior to checking your blood pressure. Record your readings and bring them to your next visit.  Work to reduce salty foods as this can cause an increase in your blood pressure. Take a look at the information below.  I highly recommend you contact [facility] for re-evaluation of your lower back and knee as discussed today.  You will be contacted regarding your referral to Pulmonology for your sleep study.  Please let us know if you have not heard back within one week.   Please schedule a physical with me within the next 2-3 weeks. You may also schedule a lab only appointment 3-4 days prior. We will discuss your lab results in detail during your physical.  It was a pleasure to meet you today! Please don't hesitate to call me with any questions. Welcome to [facility]!  Hypertension Hypertension, commonly called high blood pressure, is when the force of blood pumping through your arteries is too strong. Your arteries are the blood vessels that carry blood from your heart throughout your body. A blood pressure reading consists of a higher number over a lower number, such as 110/72. The higher number (systolic) is the pressure inside your arteries when your heart pumps. The lower number (diastolic) is the pressure inside your arteries when your heart relaxes. Ideally you want your blood pressure below 120/80. Hypertension forces your heart to work harder to pump blood. Your arteries may become narrow or stiff. Having untreated or uncontrolled hypertension can cause heart attack, stroke, kidney disease, and other problems. RISK FACTORS Some risk factors for high blood pressure are controllable. Others are not.  Risk factors you cannot control include:   Race. You may be  at higher risk if you are African American.  Age. Risk increases with age.  Gender. Men are at higher risk than women before age 65 years. After age 73, women are at higher risk than men. Risk factors you can control include:  Not getting enough exercise or physical activity.  Being overweight.  Getting too much fat, sugar, calories, or salt in your diet.  Drinking too much alcohol. SIGNS AND SYMPTOMS Hypertension does not usually cause signs or symptoms. Extremely high blood pressure (hypertensive crisis) may cause headache, anxiety, shortness of breath, and nosebleed. DIAGNOSIS To check if you have hypertension, your health care provider will measure your blood pressure while you are seated, with your arm held at the level of your heart. It should be measured at least twice using the same arm. Certain conditions can cause a difference in blood pressure between your right and left arms. A blood pressure reading that is higher than normal on one occasion does not mean that you need treatment. If it is not clear whether you have high blood pressure, you may be asked to return on a different day to have your blood pressure checked again. Or, you may be asked to monitor your blood pressure at home for 1 or more weeks. TREATMENT Treating high blood pressure includes making lifestyle changes and possibly taking medicine. Living a healthy lifestyle can help lower high blood pressure. You may need to change some of your habits. Lifestyle changes may include:  Following the DASH diet. This diet is high in fruits, vegetables, and whole  grains. It is low in salt, red meat, and added sugars.  Keep your sodium intake below 2,300 mg per day.  Getting at least 30-45 minutes of aerobic exercise at least 4 times per week.  Losing weight if necessary.  Not smoking.  Limiting alcoholic beverages.  Learning ways to reduce stress. Your health care provider may prescribe medicine if lifestyle changes  are not enough to get your blood pressure under control, and if one of the following is true:  You are 25-40 years of age and your systolic blood pressure is above 140.  You are 67 years of age or older, and your systolic blood pressure is above 150.  Your diastolic blood pressure is above 90.  You have diabetes, and your systolic blood pressure is over 140 or your diastolic blood pressure is over 90.  You have kidney disease and your blood pressure is above 140/90.  You have heart disease and your blood pressure is above 140/90. Your personal target blood pressure may vary depending on your medical conditions, your age, and other factors. HOME CARE INSTRUCTIONS  Have your blood pressure rechecked as directed by your health care provider.   Take medicines only as directed by your health care provider. Follow the directions carefully. Blood pressure medicines must be taken as prescribed. The medicine does not work as well when you skip doses. Skipping doses also puts you at risk for problems.  Do not smoke.   Monitor your blood pressure at home as directed by your health care provider. SEEK MEDICAL CARE IF:   You think you are having a reaction to medicines taken.  You have recurrent headaches or feel dizzy.  You have swelling in your ankles.  You have trouble with your vision. SEEK IMMEDIATE MEDICAL CARE IF:  You develop a severe headache or confusion.  You have unusual weakness, numbness, or feel faint.  You have severe chest or abdominal pain.  You vomit repeatedly.  You have trouble breathing. MAKE SURE YOU:   Understand these instructions.  Will watch your condition.  Will get help right away if you are not doing well or get worse.   This information is not intended to replace advice given to you by your health care provider. Make sure you discuss any questions you have with your health care provider.   Document Released: 01/23/2005 Document Revised:  06/09/2014 Document Reviewed: 11/15/2012 Elsevier Interactive Patient Education 2016 Elsevier Inc.  DASH Eating Plan DASH stands for "Dietary Approaches to Stop Hypertension." The DASH eating plan is a healthy eating plan that has been shown to reduce high blood pressure (hypertension). Additional health benefits may include reducing the risk of type 2 diabetes mellitus, heart disease, and stroke. The DASH eating plan may also help with weight loss. WHAT DO I NEED TO KNOW ABOUT THE DASH EATING PLAN? For the DASH eating plan, you will follow these general guidelines:  Choose foods with a percent daily value for sodium of less than 5% (as listed on the food label).  Use salt-free seasonings or herbs instead of table salt or sea salt.  Check with your health care provider or pharmacist before using salt substitutes.  Eat lower-sodium products, often labeled as "lower sodium" or "no salt added."  Eat fresh foods.  Eat more vegetables, fruits, and low-fat dairy products.  Choose whole grains. Look for the word "whole" as the first word in the ingredient list.  Choose fish and skinless chicken or [place] more often than red meat.  Limit fish, poultry, and meat to 6 oz (170 g) each day.  Limit sweets, desserts, sugars, and sugary drinks.  Choose heart-healthy fats.  Limit cheese to 1 oz (28 g) per day.  Eat more home-cooked food and less restaurant, buffet, and fast food.  Limit fried foods.  Cook foods using methods other than frying.  Limit canned vegetables. If you do use them, rinse them well to decrease the sodium.  When eating at a restaurant, ask that your food be prepared with less salt, or no salt if possible. WHAT FOODS CAN I EAT? Seek help from a dietitian for individual calorie needs. Grains Whole grain or whole wheat bread. Brown rice. Whole grain or whole wheat pasta. Quinoa, bulgur, and whole grain cereals. Low-sodium cereals. Corn or whole wheat flour tortillas.  Whole grain cornbread. Whole grain crackers. Low-sodium crackers. Vegetables Fresh or frozen vegetables (raw, steamed, roasted, or grilled). Low-sodium or reduced-sodium tomato and vegetable juices. Low-sodium or reduced-sodium tomato sauce and paste. Low-sodium or reduced-sodium canned vegetables.  Fruits All fresh, canned (in natural juice), or frozen fruits. Meat and Other Protein Products Ground beef (85% or leaner), grass-fed beef, or beef trimmed of fat. Skinless chicken or [place]. Ground chicken or [place]. Pork trimmed of fat. All fish and seafood. Eggs. Dried beans, peas, or lentils. Unsalted nuts and seeds. Unsalted canned beans. Dairy Low-fat dairy products, such as skim or 1% milk, 2% or reduced-fat cheeses, low-fat ricotta or cottage cheese, or plain low-fat yogurt. Low-sodium or reduced-sodium cheeses. Fats and Oils Tub margarines without trans fats. Light or reduced-fat mayonnaise and salad dressings (reduced sodium). Avocado. Safflower, olive, or canola oils. Natural peanut or almond butter. Other Unsalted popcorn and pretzels. The items listed above may not be a complete list of recommended foods or beverages. Contact your dietitian for more options. WHAT FOODS ARE NOT RECOMMENDED? Grains White bread. White pasta. White rice. Refined cornbread. Bagels and croissants. Crackers that contain trans fat. Vegetables Creamed or fried vegetables. Vegetables in a cheese sauce. Regular canned vegetables. Regular canned tomato sauce and paste. Regular tomato and vegetable juices. Fruits Dried fruits. Canned fruit in light or heavy syrup. Fruit juice. Meat and Other Protein Products Fatty cuts of meat. Ribs, chicken wings, bacon, sausage, bologna, salami, chitterlings, fatback, hot dogs, bratwurst, and packaged luncheon meats. Salted nuts and seeds. Canned beans with salt. Dairy Whole or 2% milk, cream, half-and-half, and cream cheese. Whole-fat or sweetened yogurt. Full-fat cheeses or  blue cheese. Nondairy creamers and whipped toppings. Processed cheese, cheese spreads, or cheese curds. Condiments Onion and garlic salt, seasoned salt, table salt, and sea salt. Canned and packaged gravies. Worcestershire sauce. Tartar sauce. Barbecue sauce. Teriyaki sauce. Soy sauce, including reduced sodium. Steak sauce. Fish sauce. Oyster sauce. Cocktail sauce. Horseradish. Ketchup and mustard. Meat flavorings and tenderizers. Bouillon cubes. Hot sauce. Tabasco sauce. Marinades. Taco seasonings. Relishes. Fats and Oils Butter, stick margarine, lard, shortening, ghee, and bacon fat. Coconut, palm kernel, or palm oils. Regular salad dressings. Other Pickles and olives. Salted popcorn and pretzels. The items listed above may not be a complete list of foods and beverages to avoid. Contact your dietitian for more information. WHERE CAN I FIND MORE INFORMATION? National Heart, Lung, and Blood Institute: [employer]   This information is not intended to replace advice given to you by your health care provider. Make sure you discuss any questions you have with your health care provider.   Document Released: 01/12/2011 Document Revised: 02/13/2014 Document Reviewed: 11/27/2012 Elsevier Interactive Patient  Education 2016 [employer].

## 2015-07-06 NOTE — Assessment & Plan Note (Signed)
History of bulging discs with 5 prior surgeries. Today he has what appears to be foot drop which is likely causing him to fall. Highly recommended he visit with his orthopedist and physical therapy to discuss, he declines referral and recommendation today. Discussed that foot drop can become permanent if not addressed soon. He verbalized understanding.

## 2015-07-06 NOTE — Progress Notes (Signed)
Subjective:    Patient ID: Seth Larsen, male    DOB: 1960-02-20, 55 y.o.   MRN: [id number]  HPI  Seth Larsen is a 55 year old male who presents today to establish care and discuss the problems mentioned below. Will obtain old records. His last physical was numerous years ago.   1) Bell's Palsy: Evaluated and treated at [facility] on 05/24 for Bells Palsy after complaints of right facial drooping with numbness to his tongue, facial discomfort, and tearing to his right eye. He was treated with a course of Valtrex and Prednisone. He underwent stat CT of his head to rule out acute CVA which was negative. Since treatment he's feeling about the same.   2) Elevated Blood Pressure: No prior diagnosis, but has not been under the care of a medical provider in numerous years. [facility] blood [facility] so he was provided with Clonidine 0.1 mg that day. He's not been to a doctors office in the last several years and has not checked his BP in years.   He has a strong family history of hypertension, denies heart disease or stroke. He reports headaches 2-3 times weekly, has noticed changes in his vision over the past 1-2 years. Denies chest pain, dizziness, shortness of breath, lower extremity swelling.   3) High Risk for Sleep Apnea: He's been told over the years by his wife that he snores and stops breathing during his sleep. He experiences daytime tiredness and will often fall asleep at work or while in traffic. He was also told by an anesthesiologist in the past that he has sleep apnea. He's never undergone formal testing.   4) Chronic Knee and Back Pain: Last knee replacement in 2012 to right knee, last back surgery was in 2013. He's tripping and falling onto his right knee frequently as he cannot lift his right foot. He has a history of numerous lumbar surgeries. He was following at [facility]   Review of Systems  Constitutional: Negative for  unexpected weight change.  HENT: Negative for rhinorrhea.   Eyes: Positive for visual disturbance.  Respiratory: Negative for shortness of breath.   Cardiovascular: Negative for chest pain and leg swelling.  Musculoskeletal: Positive for back pain and arthralgias.  Skin: Negative for rash.  Neurological: Positive for headaches. Negative for dizziness.  Hematological: Negative for adenopathy.  Psychiatric/Behavioral: Positive for sleep disturbance.       No past medical history on file.   Social History   Social History  . Marital Status: Married    Spouse Name: N/A  . Number of Children: N/A  . Years of Education: N/A   Occupational History  . Not on file.   Social History Main Topics  . Smoking status: Never Smoker   . Smokeless tobacco: Current User    Types: Chew  . Alcohol Use: No  . Drug Use: No  . Sexual Activity: Not on file   Other Topics Concern  . Not on file   Social History Narrative   Married.   Works at numerous occupations.    Enjoys tending to his animals.     No past surgical history on file.  Family History  Problem Relation Age of Onset  . Hypertension Father   . Diabetes Brother   . Hypertension Mother   . Breast cancer Mother   . Alzheimer's disease Father   . Breast cancer Sister     No Known Allergies  Current Outpatient Prescriptions on File Prior to Visit  Medication Sig Dispense Refill  . predniSONE (DELTASONE) 20 MG tablet 4 tabs po qam x 1 wk, then 3 tabs po qd x 1d, 2 tabs po qd x 2d, 1 tab po qd x 3d 38 tablet 0  . valACYclovir (VALTREX) 1000 MG tablet Take 1 tablet (1,000 mg total) by mouth 3 (three) times daily. 21 tablet 0   No current facility-administered medications on file prior to visit.    BP 164/84 mmHg  Pulse 66  Temp(Src) 98 F (36.7 C) (Oral)  Ht 5' 7.5" (1.715 m)  Wt 263 lb 6.4 oz (119.477 kg)  BMI 40.62 kg/m2  SpO2 98%    Objective:   Physical Exam  Constitutional: He is oriented to person,  place, and time. He appears well-nourished.  Neck: Neck supple.  Cardiovascular: Normal rate and regular rhythm.   Pulmonary/Chest: Effort normal and breath sounds normal. He has no wheezes. He has no rales.  Musculoskeletal:       Right knee: He exhibits decreased range of motion. He exhibits no swelling. No tenderness found.  Notice what appears to be foot drop to right foot during ambulation.  Neurological: He is alert and oriented to person, place, and time.  Skin: Skin is warm and dry.  Psychiatric: He has a normal mood and affect.          Assessment & Plan:  Bells Palsy:  Diagnosed 6 days ago.  Currently compliant to his medication regimen. Exam consistent with diagnosis. Alert and oriented x 3. Neuro exam other wise unremarkable.

## 2015-07-06 NOTE — Assessment & Plan Note (Signed)
Highly suggestive of OSA. Referral placed to pulmonology for further evaluation and testing.

## 2015-07-06 NOTE — Assessment & Plan Note (Signed)
Elevated on 2 separate readings, does not check at home. No formal diagnosis, but strong family history, and is at high risk due to obesity. Start low dose Lisinopril 10 mg today. Will have him check BP levels at home. Follow up in 2-3 weeks for re-evaluation. BMP next visit.

## 2015-07-06 NOTE — Progress Notes (Signed)
Pre visit review using our clinic review tool, if applicable. No additional management support is needed unless otherwise documented below in the visit note. 

## 2015-07-11 ENCOUNTER — Other Ambulatory Visit: Payer: Self-pay | Admitting: Primary Care

## 2015-07-11 DIAGNOSIS — Z0000 Encounter for general adult medical examination without abnormal findings: Secondary | ICD-10-CM

## 2015-07-11 DIAGNOSIS — Z125 Encounter for screening for malignant neoplasm of prostate: Secondary | ICD-10-CM

## 2015-07-11 DIAGNOSIS — I10 Essential (primary) hypertension: Secondary | ICD-10-CM

## 2015-07-16 ENCOUNTER — Other Ambulatory Visit (INDEPENDENT_AMBULATORY_CARE_PROVIDER_SITE_OTHER): Payer: Managed Care, Other (non HMO)

## 2015-07-16 ENCOUNTER — Encounter: Payer: Self-pay | Admitting: Primary Care

## 2015-07-16 ENCOUNTER — Ambulatory Visit (INDEPENDENT_AMBULATORY_CARE_PROVIDER_SITE_OTHER): Payer: Managed Care, Other (non HMO) | Admitting: Primary Care

## 2015-07-16 VITALS — BP 120/64 | HR 69 | Temp 97.8°F | Wt 268.8 lb

## 2015-07-16 DIAGNOSIS — R350 Frequency of micturition: Secondary | ICD-10-CM

## 2015-07-16 DIAGNOSIS — I10 Essential (primary) hypertension: Secondary | ICD-10-CM | POA: Diagnosis not present

## 2015-07-16 DIAGNOSIS — Z125 Encounter for screening for malignant neoplasm of prostate: Secondary | ICD-10-CM

## 2015-07-16 DIAGNOSIS — Z0000 Encounter for general adult medical examination without abnormal findings: Secondary | ICD-10-CM

## 2015-07-16 LAB — POC URINALSYSI DIPSTICK (AUTOMATED)
LEUKOCYTES UA: NEGATIVE
PH UA: 7
Spec Grav, UA: 1.015
Urobilinogen, UA: NEGATIVE

## 2015-07-16 LAB — COMPREHENSIVE METABOLIC PANEL
ALT: 39 U/L (ref 0–53)
AST: 40 U/L — ABNORMAL HIGH (ref 0–37)
Albumin: 3.7 g/dL (ref 3.5–5.2)
Alkaline Phosphatase: 31 U/L — ABNORMAL LOW (ref 39–117)
BUN: 17 mg/dL (ref 6–23)
CHLORIDE: 100 meq/L (ref 96–112)
CO2: 32 meq/L (ref 19–32)
Calcium: 8.8 mg/dL (ref 8.4–10.5)
Creatinine, Ser: 0.78 mg/dL (ref 0.40–1.50)
GFR: 109.13 mL/min (ref >60.00)
GLUCOSE: 131 mg/dL — AB (ref 70–99)
POTASSIUM: 4.9 meq/L (ref 3.5–5.1)
SODIUM: 139 meq/L (ref 135–145)
TOTAL PROTEIN: 6.3 g/dL (ref 6.0–8.3)
Total Bilirubin: 0.6 mg/dL (ref 0.2–1.2)

## 2015-07-16 LAB — PSA: PSA: 0.91 ng/mL (ref 0.10–4.00)

## 2015-07-16 LAB — HEMOGLOBIN A1C: Hgb A1c MFr Bld: 6.6 % — ABNORMAL HIGH (ref 4.6–6.5)

## 2015-07-16 LAB — LIPID PANEL
CHOLESTEROL: 178 mg/dL (ref 0–200)
HDL: 47.4 mg/dL (ref >39.00)
LDL CALC: 113 mg/dL — AB (ref 0–99)
NONHDL: 130.11
Total CHOL/HDL Ratio: 4
Triglycerides: 88 mg/dL (ref 0.0–149.0)
VLDL: 17.6 mg/dL (ref 0.0–40.0)

## 2015-07-16 MED ORDER — AMLODIPINE BESYLATE 10 MG PO TABS: 10.0000 mg | ORAL_TABLET | Freq: Every day | ORAL | Status: DC

## 2015-07-16 NOTE — Assessment & Plan Note (Signed)
Urinary frequency and myalgias since initiation of Lisinopril. UA today unremarkable. BP much improved since initiation, but give side effects will check labs and stop lisinopril. BMP pending. Start Amlodipine 10 mg. Follow up in 1 week for re-evaluation.

## 2015-07-16 NOTE — Progress Notes (Signed)
   Subjective:    Patient ID: Seth Larsen, male    DOB: [birth year], 55 y.o.   MRN: [id number]  HPI  Seth Larsen is a 55 year old male who presents today with a chief complaint of . He was evaluated in late May 2017 and noted to have history of numerous elevated blood pressure readings with BP above goal that day. He was initiated on Lisinopril 10 mg tablets that visit.  Since his last visit he's experienced urinary frequency, increased cramping to bilateral lower extremities, and nocturia. He's never experienced any of these symptoms before. These symptoms started on May 30th after initiation of the Lisinopril. He is drinking 5-6 bottles of water daily. Denies changes in caffeine consumption, dysuria, hematuria, difficulty urinating, fevers.   His blood pressure has improved which is 120/64 in the clinic today. His home readings are ranging 130-140's/60-70's.   BP Readings from Last 3 Encounters:  07/16/15 120/64  07/06/15 164/84  06/30/15 172/88     Review of Systems  Constitutional: Negative for fever.  Respiratory: Negative for shortness of breath.   Cardiovascular: Negative for chest pain.  Genitourinary: Positive for urgency and frequency. Negative for dysuria and flank pain.  Musculoskeletal: Positive for myalgias.  Neurological: Negative for dizziness.       No past medical history on file.   Social History   Social History  . Marital Status: Married    Spouse Name: N/A  . Number of Children: N/A  . Years of Education: N/A   Occupational History  . Not on file.   Social History Main Topics  . Smoking status: Never Smoker   . Smokeless tobacco: Current User    Types: Chew  . Alcohol Use: No  . Drug Use: No  . Sexual Activity: Not on file   Other Topics Concern  . Not on file   Social History Narrative   Married.   Works at numerous occupations.    Enjoys tending to his animals.     No past surgical history on file.  Family History  Problem Relation  Age of Onset  . Hypertension Father   . Diabetes Brother   . Hypertension Mother   . Breast cancer Mother   . Alzheimer's disease Father   . Breast cancer Sister     No Known Allergies  Current Outpatient Prescriptions on File Prior to Visit  Medication Sig Dispense Refill  . lisinopril (PRINIVIL,ZESTRIL) 10 MG tablet Take 1 tablet (10 mg total) by mouth daily. 30 tablet 3  . valACYclovir (VALTREX) 1000 MG tablet Take 1 tablet (1,000 mg total) by mouth 3 (three) times daily. 21 tablet 0   No current facility-administered medications on file prior to visit.    BP 120/64 mmHg  Pulse 69  Temp(Src) 97.8 F (36.6 C)  Wt 268 lb 12.8 oz (121.927 kg)  SpO2 98%    Objective:   Physical Exam  Constitutional: He appears well-nourished.  Cardiovascular: Normal rate and regular rhythm.   Pulmonary/Chest: Effort normal and breath sounds normal.  Abdominal: Soft. There is no CVA tenderness.  Skin: Skin is warm and dry.          Assessment & Plan:

## 2015-07-16 NOTE — Patient Instructions (Signed)
Your urine sample does not show evidence of infection which is reassuring.  Stop taking Lisinopril. Start Amlodipine 10 mg. Take 1 tablet by mouth everyday.  Please notify me if your symptoms persist.  It was a pleasure to see you today!

## 2015-07-19 ENCOUNTER — Encounter: Payer: Self-pay | Admitting: Internal Medicine

## 2015-07-19 ENCOUNTER — Ambulatory Visit (INDEPENDENT_AMBULATORY_CARE_PROVIDER_SITE_OTHER): Payer: Managed Care, Other (non HMO) | Admitting: Internal Medicine

## 2015-07-19 VITALS — BP 138/86 | HR 86 | Ht 70.0 in | Wt 271.0 lb

## 2015-07-19 DIAGNOSIS — G4719 Other hypersomnia: Secondary | ICD-10-CM

## 2015-07-19 NOTE — Patient Instructions (Signed)
--  Recommend that you stop chewing tobacco.   --Try to get at least 6 hours of sleep per night.   --Will send for sleep study.    Sleep Apnea Sleep apnea is disorder that affects a person's sleep. A person with sleep apnea has abnormal pauses in their breathing when they sleep. It is hard for them to get a good sleep. This makes a person tired during the day. It also can lead to other physical problems. There are three types of sleep apnea. One type is when breathing stops for a short time because your airway is blocked (obstructive sleep apnea). Another type is when the brain sometimes fails to give the normal signal to breathe to the muscles that control your breathing (central sleep apnea). The third type is a combination of the other two types. HOME CARE   Take all medicine as told by your doctor.  Avoid alcohol, calming medicines (sedatives), and depressant drugs.  Try to lose weight if you are overweight. Talk to your doctor about a healthy weight goal.  Your doctor may have you use a device that helps to open your airway. It can help you get the air that you need. It is called a positive airway pressure (PAP) device.   MAKE SURE YOU:   Understand these instructions.  Will watch your condition.  Will get help right away if you are not doing well or get worse.  It may take approximately 1 month for you to get used to wearing her CPAP every night.

## 2015-07-19 NOTE — Addendum Note (Signed)
Addended by: Maxwell Marion A on: 07/19/2015 09:47 AM   Modules accepted: Orders

## 2015-07-19 NOTE — Progress Notes (Signed)
[facility] [place] [facility] Consultation      Assessment and Plan:  Excessive daytime sleepiness. -Symptoms and signs of obstructive sleep apnea, including witnessed apneas, and excessive sleepiness with an Epworth score of 23. -Patient is being sent for sleep study and will follow up with the titration study if positive.  Essential hypertension. -Recent diagnosis of hypertension, this may be contributed to by sleep apnea.  Nicotine abuse. -Patient uses chewing tobacco, this could disrupt sleep as it is a stimulant. We discussed the importance of quitting.  Short Sleep.  -He is currently only getting about 4-4-1/2 hours of sleep per night, which could be contributing to his sleepiness. We discussed that even if we start him on CPAP. He may continued to have some daytime sleepiness due to short sleep, recommend that he try to get at least 6 hours of sleep per night.  Headache. -Patient complains of headache, which may be contributed to by sleep apnea. We will see if this improves with treatment with CPAP.  Date: 07/19/2015  MRN# [id number] ANUEL SITTER 01-05-1961  Referring Physician: NP Chestine Spore.   Seth Larsen is a 55 y.o. old male seen in consultation for chief complaint of:    Chief Complaint  Patient presents with  . sleep consult    pt ref by Dr. Vernona Rieger.     HPI:   Patient is a 55 year old male presents with symptoms of excessive daytime sleepiness. He falls asleep at unwanted time including at stop lights. He has witnessed snoring and restless sleep, as well as witnessed apneas. The patient typically goes to bed between 9 to 10 PM he usually falls asleep with approximately 5 minutes. He wakes up numerous times during the night, he typically gets out of bed at 2:30 AM to go to work.  He has been told by his wife that he has OSA, and he has been told by RN's after surgery that he has OSA. He has a grand baby on the way and he would like to take better care of  himself. He works as a Architectural technologist, and drives much of the day.   On weekends he wakes at 6. When wakes at 2:30 does not feel rested, on weekends does not feel any different.  He does chewing tobacco, 4 or 5 times per day.   PMHX:   Past Medical History  Diagnosis Date  . Hypertension    Surgical Hx:  Past Surgical History  Procedure Laterality Date  . Back surgery    . Carpal tunnel release    . Knee surgery    . Total knee arthroplasty     Family Hx:  Family History  Problem Relation Age of Onset  . Hypertension Father   . Diabetes Brother   . Hypertension Mother   . Breast cancer Mother   . Alzheimer's disease Father   . Breast cancer Sister    Social Hx:   Social History  Substance Use Topics  . Smoking status: Never Smoker   . Smokeless tobacco: Current User    Types: Chew  . Alcohol Use: No   Medication:   Current Outpatient Rx  Name  Route  Sig  Dispense  Refill  . amLODipine (NORVASC) 10 MG tablet   Oral   Take 1 tablet (10 mg total) by mouth daily.   30 tablet   1   . valACYclovir (VALTREX) 1000 MG tablet   Oral   Take 1 tablet (1,000 mg total) by mouth  3 (three) times daily.   21 tablet   0       Allergies:  Review of patient's allergies indicates no known allergies.  Review of Systems: Gen:  Denies  fever, sweats, chills HEENT: Denies blurred vision, double vision. bleeds, sore throat Cvc:  No dizziness, chest pain. Resp:   Denies cough or sputum production, shortness of breath Gi: Denies swallowing difficulty, stomach pain. Gu:  Denies bladder incontinence, burning urine Ext:   No Joint pain, stiffness. Skin: No skin rash,  hives  Endoc:  No polyuria, polydipsia. Psych: No depression, insomnia. Other:  All other systems were reviewed with the patient and were negative other that what is mentioned in the HPI.   Physical Examination:   VS: BP 138/86 mmHg  Pulse 86  Ht 5\' 10"  (1.778 m)  Wt 271 lb (122.925 kg)  BMI 38.88 kg/m2   SpO2 96%  General Appearance: No distress  Neuro:without focal findings,  speech normal,  HEENT: PERRLA, EOM intact.  [place] 2.  Pulmonary: normal breath sounds, No wheezing.  CardiovascularNormal S1,S2.  No m/r/g.   Abdomen: Benign, Soft, non-tender. Renal:  No costovertebral tenderness  GU:  No performed at this time. Endoc: No evident thyromegaly, no signs of acromegaly. Skin:   warm, no rashes, no ecchymosis  Extremities: normal, no cyanosis, clubbing.  Other findings:    LABORATORY PANEL:   CBC No results for input(s): WBC, HGB, HCT, PLT in the last 168 hours. ------------------------------------------------------------------------------------------------------------------  Chemistries   Recent Labs Lab 07/16/15 0815  NA 139  K 4.9  CL 100  CO2 32  GLUCOSE 131*  BUN 17  CREATININE 0.78  CALCIUM 8.8  AST 40*  ALT 39  ALKPHOS 31*  BILITOT 0.6   ------------------------------------------------------------------------------------------------------------------  Cardiac Enzymes No results for input(s): TROPONINI in the last 168 hours. ------------------------------------------------------------  RADIOLOGY:  No results found.     Thank  you for the consultation and for allowing [facility] to assist in the care of your patient. Our recommendations are noted above.  Please contact [facility] if we can be of further service.   [place], MD.  Board Certified in Internal Medicine, Pulmonary Medicine, Critical Care Medicine, and Sleep Medicine.  [facility] Office Number: [phone]  [phone], M.D.  Santiago Glad, M.D.  Stephanie Acre, M.D  07/19/2015

## 2015-07-20 ENCOUNTER — Other Ambulatory Visit: Payer: Self-pay

## 2015-07-20 DIAGNOSIS — R0683 Snoring: Secondary | ICD-10-CM

## 2015-07-20 DIAGNOSIS — G4719 Other hypersomnia: Secondary | ICD-10-CM

## 2015-07-23 ENCOUNTER — Ambulatory Visit (INDEPENDENT_AMBULATORY_CARE_PROVIDER_SITE_OTHER): Payer: Managed Care, Other (non HMO) | Admitting: Primary Care

## 2015-07-23 ENCOUNTER — Encounter: Payer: Self-pay | Admitting: Primary Care

## 2015-07-23 VITALS — BP 140/86 | HR 66 | Temp 97.8°F | Ht 70.0 in | Wt 270.8 lb

## 2015-07-23 DIAGNOSIS — Z23 Encounter for immunization: Secondary | ICD-10-CM

## 2015-07-23 DIAGNOSIS — E785 Hyperlipidemia, unspecified: Secondary | ICD-10-CM | POA: Insufficient documentation

## 2015-07-23 DIAGNOSIS — Z0000 Encounter for general adult medical examination without abnormal findings: Secondary | ICD-10-CM | POA: Diagnosis not present

## 2015-07-23 DIAGNOSIS — I10 Essential (primary) hypertension: Secondary | ICD-10-CM

## 2015-07-23 DIAGNOSIS — E119 Type 2 diabetes mellitus without complications: Secondary | ICD-10-CM | POA: Diagnosis not present

## 2015-07-23 DIAGNOSIS — E1165 Type 2 diabetes mellitus with hyperglycemia: Secondary | ICD-10-CM | POA: Insufficient documentation

## 2015-07-23 MED ORDER — METFORMIN HCL ER 500 MG PO TB24: 500.0000 mg | ORAL_TABLET | Freq: Every day | ORAL | Status: DC

## 2015-07-23 MED ORDER — ATORVASTATIN CALCIUM 10 MG PO TABS: 10.0000 mg | ORAL_TABLET | Freq: Every evening | ORAL | Status: DC

## 2015-07-23 NOTE — Progress Notes (Signed)
Subjective:    Patient ID: Seth Larsen, male    DOB: 01-02-1961, 55 y.o.   MRN: [id number]  HPI  Mr. Seth Larsen is a 55 year old male who presents today for complete physical.  Immunizations: -Tetanus: Unsure, believes it's been about 10 years. -Influenza: Completed in October 2016   Diet: He endorses a fair diet. Breakfast: Fast food Lunch: Vegetables, cheese, bread, Fast food Dinner: Casseroles, meat, vegetables  Snacks: Crackers Desserts: Occasionally  Beverages: Diet soda, water (3-4 bottles daily with crystal light)  Exercise: He does not currently exercise. Eye exam: Completed several years ago. Plans to schedule. Wears readers. Dental exam: Completed 1 year ago.  Colonoscopy: Never completed.    Review of Systems  Constitutional: Negative for unexpected weight change.  HENT: Negative for rhinorrhea.   Respiratory: Negative for shortness of breath.   Cardiovascular: Negative for chest pain and leg swelling.  Gastrointestinal: Negative for diarrhea and constipation.  Genitourinary: Negative for difficulty urinating.  Musculoskeletal: Positive for back pain.  Allergic/Immunologic: Negative for environmental allergies.  Neurological: Negative for dizziness and numbness.       Headaches improved to once every 2 days  Psychiatric/Behavioral:       Denies concerns for anxiety or depression       Past Medical History  Diagnosis Date  . Hypertension      Social History   Social History  . Marital Status: Married    Spouse Name: N/A  . Number of Children: N/A  . Years of Education: N/A   Occupational History  . Not on file.   Social History Main Topics  . Smoking status: Never Smoker   . Smokeless tobacco: Current User    Types: Chew  . Alcohol Use: No  . Drug Use: No  . Sexual Activity: Not on file   Other Topics Concern  . Not on file   Social History Narrative   Married.   Works at numerous occupations.    Enjoys tending to his animals.      Past Surgical History  Procedure Laterality Date  . Back surgery    . Carpal tunnel release    . Knee surgery    . Total knee arthroplasty      Family History  Problem Relation Age of Onset  . Hypertension Father   . Diabetes Brother   . Hypertension Mother   . Breast cancer Mother   . Alzheimer's disease Father   . Breast cancer Sister     No Known Allergies  Current Outpatient Prescriptions on File Prior to Visit  Medication Sig Dispense Refill  . acetaminophen (TYLENOL) 500 MG tablet Take 500 mg by mouth every 6 (six) hours as needed.    Marland Kitchen amLODipine (NORVASC) 10 MG tablet Take 1 tablet (10 mg total) by mouth daily. 30 tablet 1   No current facility-administered medications on file prior to visit.    BP 140/86 mmHg  Pulse 66  Temp(Src) 97.8 F (36.6 C) (Oral)  Ht 5\' 10"  (1.778 m)  Wt 270 lb 12.8 oz (122.834 kg)  BMI 38.86 kg/m2  SpO2 97%    Objective:   Physical Exam  Constitutional: He is oriented to person, place, and time. He appears well-nourished.  HENT:  Right Ear: Tympanic membrane and ear canal normal.  Left Ear: Tympanic membrane and ear canal normal.  Nose: Nose normal. Right sinus exhibits no maxillary sinus tenderness and no frontal sinus tenderness. Left sinus exhibits no maxillary sinus tenderness and no  frontal sinus tenderness.  Mouth/Throat: Oropharynx is clear and moist.  Eyes: Conjunctivae and EOM are normal. Pupils are equal, round, and reactive to light.  Neck: Neck supple. Carotid bruit is not present. No thyromegaly present.  Cardiovascular: Normal rate, regular rhythm and normal heart sounds.   Pulmonary/Chest: Effort normal and breath sounds normal. He has no wheezes. He has no rales.  Abdominal: Soft. Bowel sounds are normal. There is no tenderness.  Musculoskeletal: Normal range of motion.  Neurological: He is alert and oriented to person, place, and time. He has normal reflexes. No cranial nerve deficit.  Skin: Skin is warm  and dry.  Psychiatric: He has a normal mood and affect.          Assessment & Plan:

## 2015-07-23 NOTE — Assessment & Plan Note (Signed)
Td, due and provided today. Pneumovax provided today. Due for colonoscopy, wants to try Cologuard, information provided. Discussed the importance of a healthy diet and regular exercise in order for weight loss especially given diabetes and hyperlipidemia. Labs with type 2 diabetes, hyperlipidemia. Exam unremarkable.  Follow up in 3 months for labs, 6 months for re-evaluation.

## 2015-07-23 NOTE — Assessment & Plan Note (Signed)
A1C of 6.6 on recent labs. No prior diagnosis. Discussed this diagnosis in great detail today including improvements he needs to make in diet. Start Metformin XR 500 mg once daily. Pneumonia vaccination provided today. Statin initiated today given hyperlipidemia. Reaction on ACE. Will consider low dose ARB.  Repeat A1C in 3 months.

## 2015-07-23 NOTE — Assessment & Plan Note (Addendum)
Symptoms improved on Amlodipine, headaches improved. BP slightly above goal today, will have him monitor at home for 2 weeks. If above goal then will add in low dose ARB given diabetes. Could not tolerate ACE.

## 2015-07-23 NOTE — Patient Instructions (Signed)
You have Type 2 Diabetes which means your blood sugar is too high. You must improve your diet and lose weight.  Start Metformin XR 500 mg. Take 1 tablet by mouth every morning with a meal.  Your cholesterol is too high.  Start atorvastatin 10 mg tablets. Take 1 tablet by mouth at bedtime.  Reduce: Fast food, fried foods, fatty foods, crackers, sweets. Increase: Lean Protein, whole grains, vegetables, fruit  Start exercising. You should be getting 1 hour of moderate intensity exercise 4 days weekly.  Ensure you are consuming 64 ounces of water daily.  You were provided with a tetanus and pneumonia vaccination today.  Please return the cologuard kit as discussed.  Check your blood pressure daily, around the same time of day, for the next 2 weeks.   Ensure that you have rested for 30 minutes prior to checking your blood pressure. Record your readings and I will call you in 2 weeks.  Schedule a lab only appointment in 3 months for re-evaluation of your blood sugar levels.  Follow up in 6 months for re-evaluation.  It was a pleasure to see you today!  Type 2 Diabetes Mellitus, Adult Type 2 diabetes mellitus, often simply referred to as type 2 diabetes, is a long-lasting (chronic) disease. In type 2 diabetes, the pancreas does not make enough insulin (a hormone), the cells are less responsive to the insulin that is made (insulin resistance), or both. Normally, insulin moves sugars from food into the tissue cells. The tissue cells use the sugars for energy. The lack of insulin or the lack of normal response to insulin causes excess sugars to build up in the blood instead of going into the tissue cells. As a result, high blood sugar (hyperglycemia) develops. The effect of high sugar (glucose) levels can cause many complications. Type 2 diabetes was also previously called adult-onset diabetes, but it can occur at any age.  RISK FACTORS  A person is predisposed to developing type 2 diabetes  if someone in the family has the disease and also has one or more of the following primary risk factors:  Weight gain, or being overweight or obese.  An inactive lifestyle.  A history of consistently eating high-calorie foods. Maintaining a normal weight and regular physical activity can reduce the chance of developing type 2 diabetes. SYMPTOMS  A person with type 2 diabetes may not show symptoms initially. The symptoms of type 2 diabetes appear slowly. The symptoms include:  Increased thirst (polydipsia).  Increased urination (polyuria).  Increased urination during the night (nocturia).  Sudden or unexplained weight changes.  Frequent, recurring infections.  Tiredness (fatigue).  Weakness.  Vision changes, such as blurred vision.  Fruity smell to your breath.  Abdominal pain.  Nausea or vomiting.  Cuts or bruises which are slow to heal.  Tingling or numbness in the hands or feet.  An open skin wound (ulcer). DIAGNOSIS Type 2 diabetes is frequently not diagnosed until complications of diabetes are present. Type 2 diabetes is diagnosed when symptoms or complications are present and when blood glucose levels are increased. Your blood glucose level may be checked by one or more of the following blood tests:  A fasting blood glucose test. You will not be allowed to eat for at least 8 hours before a blood sample is taken.  A random blood glucose test. Your blood glucose is checked at any time of the day regardless of when you ate.  A hemoglobin A1c blood glucose test. A hemoglobin A1c  test provides information about blood glucose control over the previous 3 months.  An oral glucose tolerance test (OGTT). Your blood glucose is measured after you have not eaten (fasted) for 2 hours and then after you drink a glucose-containing beverage. TREATMENT   You may need to take insulin or diabetes medicine daily to keep blood glucose levels in the desired range.  If you use  insulin, you may need to adjust the dosage depending on the carbohydrates that you eat with each meal or snack.  Lifestyle changes are recommended as part of your treatment. These may include:  Following an individualized diet plan developed by a nutritionist or dietitian.  Exercising daily. Your health care providers will set individualized treatment goals for you based on your age, your medicines, how long you have had diabetes, and any other medical conditions you have. Generally, the goal of treatment is to maintain the following blood glucose levels:  Before meals (preprandial): 80-130 mg/dL.  After meals (postprandial): below 180 mg/dL.  A1c: less than 6.5-7%. HOME CARE INSTRUCTIONS   Have your hemoglobin A1c level checked twice a year.  Perform daily blood glucose monitoring as directed by your health care provider.  Monitor urine ketones when you are ill and as directed by your health care provider.  Take your diabetes medicine or insulin as directed by your health care provider to maintain your blood glucose levels in the desired range.  Never run out of diabetes medicine or insulin. It is needed every day.  If you are using insulin, you may need to adjust the amount of insulin given based on your intake of carbohydrates. Carbohydrates can raise blood glucose levels but need to be included in your diet. Carbohydrates provide vitamins, minerals, and fiber which are an essential part of a healthy diet. Carbohydrates are found in fruits, vegetables, whole grains, dairy products, legumes, and foods containing added sugars.  Eat healthy foods. You should make an appointment to see a registered dietitian to help you create an eating plan that is right for you.  Lose weight if you are overweight.  Carry a medical alert card or wear your medical alert jewelry.  Carry a 15-gram carbohydrate snack with you at all times to treat low blood glucose (hypoglycemia). Some examples of  15-gram carbohydrate snacks include:  Glucose tablets, 3 or 4.  Glucose gel, 15-gram tube.  Raisins, 2 tablespoons (24 grams).  Jelly beans, 6.  Animal crackers, 8.  Regular pop, 4 ounces (120 mL).  Gummy treats, 9.  Recognize hypoglycemia. Hypoglycemia occurs with blood glucose levels of 70 mg/dL and below. The risk for hypoglycemia increases when fasting or skipping meals, during or after intense exercise, and during sleep. Hypoglycemia symptoms can include:  Tremors or shakes.  Decreased ability to concentrate.  Sweating.  Increased heart rate.  Headache.  Dry mouth.  Hunger.  Irritability.  Anxiety.  Restless sleep.  Altered speech or coordination.  Confusion.  Treat hypoglycemia promptly. If you are alert and able to safely swallow, follow the 15:15 rule:  Take 15-20 grams of rapid-acting glucose or carbohydrate. Rapid-acting options include glucose gel, glucose tablets, or 4 ounces (120 mL) of fruit juice, regular soda, or low-fat milk.  Check your blood glucose level 15 minutes after taking the glucose.  Take 15-20 grams more of glucose if the repeat blood glucose level is still 70 mg/dL or below.  Eat a meal or snack within 1 hour once blood glucose levels return to normal.  Be alert to  feeling very thirsty and urinating more frequently than usual, which are early signs of hyperglycemia. An early awareness of hyperglycemia allows for prompt treatment. Treat hyperglycemia as directed by your health care provider.  Engage in at least 150 minutes of moderate-intensity physical activity a week, spread over at least 3 days of the week or as directed by your health care provider. In addition, you should engage in resistance exercise at least 2 times a week or as directed by your health care provider. Try to spend no more than 90 minutes at one time inactive.  Adjust your medicine and food intake as needed if you start a new exercise or sport.  Follow your  sick-day plan anytime you are unable to eat or drink as usual.  Do not use any tobacco products including cigarettes, chewing tobacco, or electronic cigarettes. If you need help quitting, ask your health care provider.  Limit alcohol intake to no more than 1 drink per day for nonpregnant women and 2 drinks per day for men. You should drink alcohol only when you are also eating food. Talk with your health care provider whether alcohol is safe for you. Tell your health care provider if you drink alcohol several times a week.  Keep all follow-up visits as directed by your health care provider. This is important.  Schedule an eye exam soon after the diagnosis of type 2 diabetes and then annually.  Perform daily skin and foot care. Examine your skin and feet daily for cuts, bruises, redness, nail problems, bleeding, blisters, or sores. A foot exam by a health care provider should be done annually.  Brush your teeth and gums at least twice a day and floss at least once a day. Follow up with your dentist regularly.  Share your diabetes management plan with your workplace or school.  Keep your immunizations up to date. It is recommended that you receive a flu (influenza) vaccine every year. It is also recommended that you receive a pneumonia (pneumococcal) vaccine. If you are 49 years of age or older and have never received a pneumonia vaccine, this vaccine may be given as a series of two separate shots. Ask your health care provider which additional vaccines may be recommended.  Learn to manage stress.  Obtain ongoing diabetes education and support as needed.  Participate in or seek rehabilitation as needed to maintain or improve independence and quality of life. Request a physical or occupational therapy referral if you are having foot or hand numbness, or difficulties with grooming, dressing, eating, or physical activity. SEEK MEDICAL CARE IF:   You are unable to eat food or drink fluids for more  than 6 hours.  You have nausea and vomiting for more than 6 hours.  Your blood glucose level is over 240 mg/dL.  There is a change in mental status.  You develop an additional serious illness.  You have diarrhea for more than 6 hours.  You have been sick or have had a fever for a couple of days and are not getting better.  You have pain during any physical activity.  SEEK IMMEDIATE MEDICAL CARE IF:  You have difficulty breathing.  You have moderate to large ketone levels.   This information is not intended to replace advice given to you by your health care provider. Make sure you discuss any questions you have with your health care provider.   Document Released: 01/23/2005 Document Revised: 10/14/2014 Document Reviewed: 08/22/2011 Elsevier Interactive Patient Education 2016 [employer].  Diabetes  Mellitus and Food It is important for you to manage your blood sugar (glucose) level. Your blood glucose level can be greatly affected by what you eat. Eating healthier foods in the appropriate amounts throughout the day at about the same time each day will help you control your blood glucose level. It can also help slow or prevent worsening of your diabetes mellitus. Healthy eating may even help you improve the level of your blood pressure and reach or maintain a healthy weight.  General recommendations for healthful eating and cooking habits include:  Eating meals and snacks regularly. Avoid going long periods of time without eating to lose weight.  Eating a diet that consists mainly of plant-based foods, such as fruits, vegetables, nuts, legumes, and whole grains.  Using low-heat cooking methods, such as baking, instead of high-heat cooking methods, such as deep frying. Work with your dietitian to make sure you understand how to use the Nutrition Facts information on food labels. HOW CAN FOOD AFFECT ME? Carbohydrates Carbohydrates affect your blood glucose level more than any  other type of food. Your dietitian will help you determine how many carbohydrates to eat at each meal and teach you how to count carbohydrates. Counting carbohydrates is important to keep your blood glucose at a healthy level, especially if you are using insulin or taking certain medicines for diabetes mellitus. Alcohol Alcohol can cause sudden decreases in blood glucose (hypoglycemia), especially if you use insulin or take certain medicines for diabetes mellitus. Hypoglycemia can be a life-threatening condition. Symptoms of hypoglycemia (sleepiness, dizziness, and disorientation) are similar to symptoms of having too much alcohol.  If your health care provider has given you approval to drink alcohol, do so in moderation and use the following guidelines:  Women should not have more than one drink per day, and men should not have more than two drinks per day. One drink is equal to:  12 oz of beer.  5 oz of wine.  1 oz of hard liquor.  Do not drink on an empty stomach.  Keep yourself hydrated. Have water, diet soda, or unsweetened iced tea.  Regular soda, juice, and other mixers might contain a lot of carbohydrates and should be counted. WHAT FOODS ARE NOT RECOMMENDED? As you make food choices, it is important to remember that all foods are not the same. Some foods have fewer nutrients per serving than other foods, even though they might have the same number of calories or carbohydrates. It is difficult to get your body what it needs when you eat foods with fewer nutrients. Examples of foods that you should avoid that are high in calories and carbohydrates but low in nutrients include:  Trans fats (most processed foods list trans fats on the Nutrition Facts label).  Regular soda.  Juice.  Candy.  Sweets, such as cake, pie, doughnuts, and cookies.  Fried foods. WHAT FOODS CAN I EAT? Eat nutrient-rich foods, which will nourish your body and keep you healthy. The food you should eat also  will depend on several factors, including:  The calories you need.  The medicines you take.  Your weight.  Your blood glucose level.  Your blood pressure level.  Your cholesterol level. You should eat a variety of foods, including:  Protein.  Lean cuts of meat.  Proteins low in saturated fats, such as fish, egg whites, and beans. Avoid processed meats.  Fruits and vegetables.  Fruits and vegetables that may help control blood glucose levels, such as apples, mangoes, and  yams.  Dairy products.  Choose fat-free or low-fat dairy products, such as milk, yogurt, and cheese.  Grains, bread, pasta, and rice.  Choose whole grain products, such as multigrain bread, whole oats, and brown rice. These foods may help control blood pressure.  Fats.  Foods containing healthful fats, such as nuts, avocado, olive oil, canola oil, and fish. DOES EVERYONE WITH DIABETES MELLITUS HAVE THE SAME MEAL PLAN? Because every person with diabetes mellitus is different, there is not one meal plan that works for everyone. It is very important that you meet with a dietitian who will help you create a meal plan that is just right for you.   This information is not intended to replace advice given to you by your health care provider. Make sure you discuss any questions you have with your health care provider.   Document Released: 10/20/2004 Document Revised: 02/13/2014 Document Reviewed: 12/20/2012 Elsevier Interactive Patient Education 2016 [place] DASH stands for "Dietary Approaches to Stop Hypertension." The DASH eating plan is a healthy eating plan that has been shown to reduce high blood pressure (hypertension). Additional health benefits may include reducing the risk of type 2 diabetes mellitus, heart disease, and stroke. The DASH eating plan may also help with weight loss. WHAT DO I NEED TO KNOW ABOUT THE DASH EATING PLAN? For the DASH eating plan, you will follow these  general guidelines:  Choose foods with a percent daily value for sodium of less than 5% (as listed on the food label).  Use salt-free seasonings or herbs instead of table salt or sea salt.  Check with your health care provider or pharmacist before using salt substitutes.  Eat lower-sodium products, often labeled as "lower sodium" or "no salt added."  Eat fresh foods.  Eat more vegetables, fruits, and low-fat dairy products.  Choose whole grains. Look for the word "whole" as the first word in the ingredient list.  Choose fish and skinless chicken or [place] more often than red meat. Limit fish, poultry, and meat to 6 oz (170 g) each day.  Limit sweets, desserts, sugars, and sugary drinks.  Choose heart-healthy fats.  Limit cheese to 1 oz (28 g) per day.  Eat more home-cooked food and less restaurant, buffet, and fast food.  Limit fried foods.  Cook foods using methods other than frying.  Limit canned vegetables. If you do use them, rinse them well to decrease the sodium.  When eating at a restaurant, ask that your food be prepared with less salt, or no salt if possible. WHAT FOODS CAN I EAT? Seek help from a dietitian for individual calorie needs. Grains Whole grain or whole wheat bread. Brown rice. Whole grain or whole wheat pasta. Quinoa, bulgur, and whole grain cereals. Low-sodium cereals. Corn or whole wheat flour tortillas. Whole grain cornbread. Whole grain crackers. Low-sodium crackers. Vegetables Fresh or frozen vegetables (raw, steamed, roasted, or grilled). Low-sodium or reduced-sodium tomato and vegetable juices. Low-sodium or reduced-sodium tomato sauce and paste. Low-sodium or reduced-sodium canned vegetables.  Fruits All fresh, canned (in natural juice), or frozen fruits. Meat and Other Protein Products Ground beef (85% or leaner), grass-fed beef, or beef trimmed of fat. Skinless chicken or [place]. Ground chicken or [place]. Pork trimmed of fat. All fish and  seafood. Eggs. Dried beans, peas, or lentils. Unsalted nuts and seeds. Unsalted canned beans. Dairy Low-fat dairy products, such as skim or 1% milk, 2% or reduced-fat cheeses, low-fat ricotta or cottage cheese, or plain low-fat  yogurt. Low-sodium or reduced-sodium cheeses. Fats and Oils Tub margarines without trans fats. Light or reduced-fat mayonnaise and salad dressings (reduced sodium). Avocado. Safflower, olive, or canola oils. Natural peanut or almond butter. Other Unsalted popcorn and pretzels. The items listed above may not be a complete list of recommended foods or beverages. Contact your dietitian for more options. WHAT FOODS ARE NOT RECOMMENDED? Grains White bread. White pasta. White rice. Refined cornbread. Bagels and croissants. Crackers that contain trans fat. Vegetables Creamed or fried vegetables. Vegetables in a cheese sauce. Regular canned vegetables. Regular canned tomato sauce and paste. Regular tomato and vegetable juices. Fruits Dried fruits. Canned fruit in light or heavy syrup. Fruit juice. Meat and Other Protein Products Fatty cuts of meat. Ribs, chicken wings, bacon, sausage, bologna, salami, chitterlings, fatback, hot dogs, bratwurst, and packaged luncheon meats. Salted nuts and seeds. Canned beans with salt. Dairy Whole or 2% milk, cream, half-and-half, and cream cheese. Whole-fat or sweetened yogurt. Full-fat cheeses or blue cheese. Nondairy creamers and whipped toppings. Processed cheese, cheese spreads, or cheese curds. Condiments Onion and garlic salt, seasoned salt, table salt, and sea salt. Canned and packaged gravies. Worcestershire sauce. Tartar sauce. Barbecue sauce. Teriyaki sauce. Soy sauce, including reduced sodium. Steak sauce. Fish sauce. Oyster sauce. Cocktail sauce. Horseradish. Ketchup and mustard. Meat flavorings and tenderizers. Bouillon cubes. Hot sauce. Tabasco sauce. Marinades. Taco seasonings. Relishes. Fats and Oils Butter, stick margarine,  lard, shortening, ghee, and bacon fat. Coconut, palm kernel, or palm oils. Regular salad dressings. Other Pickles and olives. Salted popcorn and pretzels. The items listed above may not be a complete list of foods and beverages to avoid. Contact your dietitian for more information. WHERE CAN I FIND MORE INFORMATION? National Heart, Lung, and Blood Institute: travelstabloid.com   This information is not intended to replace advice given to you by your health care provider. Make sure you discuss any questions you have with your health care provider.   Document Released: 01/12/2011 Document Revised: 02/13/2014 Document Reviewed: 11/27/2012 Elsevier Interactive Patient Education [employer].

## 2015-07-23 NOTE — Assessment & Plan Note (Addendum)
LDL of 113, given diabetes and obesity this is above goal. Atorvastatin 10 mg initiated today. Also discussed importance of diet and exercise, he is not confident he can change his diet. Will repeat in 3 months. Add urine microalbumin at that time. Follow up in 6 months.

## 2015-07-23 NOTE — Progress Notes (Signed)
Pre visit review using our clinic review tool, if applicable. No additional management support is needed unless otherwise documented below in the visit note. 

## 2015-07-28 DIAGNOSIS — G4733 Obstructive sleep apnea (adult) (pediatric): Secondary | ICD-10-CM | POA: Diagnosis not present

## 2015-08-03 DIAGNOSIS — G4733 Obstructive sleep apnea (adult) (pediatric): Secondary | ICD-10-CM | POA: Diagnosis not present

## 2015-08-03 LAB — COLOGUARD: COLOGUARD: NEGATIVE

## 2015-08-05 ENCOUNTER — Other Ambulatory Visit: Payer: Self-pay | Admitting: *Deleted

## 2015-08-05 DIAGNOSIS — R0683 Snoring: Secondary | ICD-10-CM

## 2015-08-05 DIAGNOSIS — G4719 Other hypersomnia: Secondary | ICD-10-CM

## 2015-08-06 ENCOUNTER — Telehealth: Payer: Self-pay | Admitting: Primary Care

## 2015-08-06 NOTE — Telephone Encounter (Signed)
Message left for patient to return my call.  

## 2015-08-06 NOTE — Telephone Encounter (Signed)
-----   Message from Doreene Nest, NP sent at 07/23/2015  8:07 AM EDT ----- Regarding: BP Check on mr. Parrilla's BP please. He was supposed to check.

## 2015-08-12 ENCOUNTER — Telehealth: Payer: Self-pay | Admitting: *Deleted

## 2015-08-12 DIAGNOSIS — G4733 Obstructive sleep apnea (adult) (pediatric): Secondary | ICD-10-CM

## 2015-08-12 NOTE — Telephone Encounter (Signed)
Called [name] and spoke with Tobi Bastos.  Procedure code 76226 (CPAP Titration Study) has been approved. Authorization # [id number] Valid from 08/12/15 to 11/10/15. Pt may have in lab CPAP Titration Study. Will forward message back to [facility] to place order. Rhonda J Cobb

## 2015-08-12 NOTE — Telephone Encounter (Signed)
Order placed

## 2015-08-12 NOTE — Telephone Encounter (Signed)
Pt is positive for sleep apnea. Please advise if we need to order a titration or an auto per DR. Thanks.

## 2015-08-13 NOTE — Telephone Encounter (Signed)
Message left for patient to return my call.  

## 2015-08-16 ENCOUNTER — Telehealth: Payer: Self-pay | Admitting: Primary Care

## 2015-08-16 NOTE — Telephone Encounter (Signed)
Please notify Mr. Bacchi that his colo-guard result was negative. We will recheck this in 3 years.

## 2015-08-17 NOTE — Telephone Encounter (Signed)
Patient called back. BP readings are  151/77, 134/77, 130/68, 131/66, 139/83, 147/70, 126/62.  Patient overall is feeling much better with this medication.

## 2015-08-17 NOTE — Telephone Encounter (Signed)
Sending letter with results and Kate's comments for patient. 

## 2015-08-17 NOTE — Telephone Encounter (Signed)
Spoken and notified patient of Kate's comments. Patient verbalized understanding. 

## 2015-08-17 NOTE — Telephone Encounter (Signed)
Noted. Blood pressure overall seems stable. Please notify patient will reevaluate at his upcoming appointment in December.

## 2015-08-19 ENCOUNTER — Encounter: Payer: Self-pay | Admitting: Primary Care

## 2015-09-12 ENCOUNTER — Other Ambulatory Visit: Payer: Self-pay | Admitting: Primary Care

## 2015-09-12 DIAGNOSIS — I10 Essential (primary) hypertension: Secondary | ICD-10-CM

## 2015-09-14 ENCOUNTER — Encounter: Payer: Self-pay | Admitting: Primary Care

## 2015-09-24 ENCOUNTER — Ambulatory Visit: Payer: Managed Care, Other (non HMO) | Attending: Pulmonary Disease

## 2015-09-24 DIAGNOSIS — I10 Essential (primary) hypertension: Secondary | ICD-10-CM | POA: Diagnosis not present

## 2015-09-24 DIAGNOSIS — G4733 Obstructive sleep apnea (adult) (pediatric): Secondary | ICD-10-CM | POA: Diagnosis not present

## 2015-10-01 DIAGNOSIS — G4730 Sleep apnea, unspecified: Secondary | ICD-10-CM | POA: Diagnosis not present

## 2015-10-12 ENCOUNTER — Telehealth: Payer: Self-pay | Admitting: *Deleted

## 2015-10-12 DIAGNOSIS — G4733 Obstructive sleep apnea (adult) (pediatric): Secondary | ICD-10-CM

## 2015-10-12 NOTE — Telephone Encounter (Signed)
Informed pt order will be placed for CPAP machine. Pt is concerned about the full face mask. Informed pt that after he tries it for 2-3 weeks and if it still isn't working to call and we will send for mask desensitizion. Order placed for CPAP.

## 2015-10-25 ENCOUNTER — Ambulatory Visit: Payer: Managed Care, Other (non HMO) | Admitting: Internal Medicine

## 2015-10-27 ENCOUNTER — Other Ambulatory Visit (INDEPENDENT_AMBULATORY_CARE_PROVIDER_SITE_OTHER): Payer: Managed Care, Other (non HMO)

## 2015-10-27 DIAGNOSIS — E785 Hyperlipidemia, unspecified: Secondary | ICD-10-CM | POA: Diagnosis not present

## 2015-10-27 DIAGNOSIS — E119 Type 2 diabetes mellitus without complications: Secondary | ICD-10-CM

## 2015-10-27 DIAGNOSIS — G4733 Obstructive sleep apnea (adult) (pediatric): Secondary | ICD-10-CM

## 2015-10-27 LAB — MICROALBUMIN / CREATININE URINE RATIO
Creatinine,U: 174.6 mg/dL
Microalb Creat Ratio: 0.6 mg/g (ref 0.0–30.0)
Microalb, Ur: 1 mg/dL (ref 0.0–1.9)

## 2015-10-27 LAB — LIPID PANEL
CHOL/HDL RATIO: 3
Cholesterol: 119 mg/dL (ref 0–200)
HDL: 40.6 mg/dL (ref >39.00)
LDL Cholesterol: 65 mg/dL (ref 0–99)
NONHDL: 78.52
TRIGLYCERIDES: 70 mg/dL (ref 0.0–149.0)
VLDL: 14 mg/dL (ref 0.0–40.0)

## 2015-10-27 LAB — HEMOGLOBIN A1C: HEMOGLOBIN A1C: 5.8 % (ref 4.6–6.5)

## 2015-10-28 ENCOUNTER — Encounter: Payer: Self-pay | Admitting: *Deleted

## 2015-12-19 ENCOUNTER — Encounter: Payer: Self-pay | Admitting: Internal Medicine

## 2015-12-20 NOTE — Progress Notes (Signed)
[facility] Consultation      Assessment and Plan:  Obstructive sleep apnea.  --Sleep study 07/28/15; Severe OSA with AHI of 42. --On cpap with some residual apneas; and leaks.  --Will refer to mask fitting clinic.    Essential hypertension. -Recent diagnosis of hypertension, this may be contributed to by sleep apnea.  Nicotine abuse. -Patient uses chewing tobacco, this could disrupt sleep as it is a stimulant. We discussed the importance of quitting.  Short Sleep.  -He is currently only getting about 4-4-1/2 hours of sleep per night, which could be contributing to his sleepiness. We again discussed that even with CPAP he may continue to have some daytime sleepiness due to short sleep, recommend that he try to get at least 6 hours of sleep per night.   Date: 12/20/2015  MRN# [id number] Seth Larsen 12-27-1960  Seth Larsen is a 55 y.o. old male seen in consultation for chief complaint of:    Chief Complaint  Patient presents with  . Follow-up    pt states he wears cpap avg 4-6hr nightly, pt states occ he is unable to wear cpap due to working all night. pt unsure if pressure is strong enough. DME:[facility]    HPI:   Patient is a 55 year old male presented with symptoms of excessive daytime sleepiness. He was falling asleep at unwanted time including at stop lights.  He works as a Architectural technologist, and drives much of the day. He underwent a home sleep study which showed severe obstructive sleep apnea with an AHI of 42.  He has been feeling better and more rested, he is no longer snoring. He notes that he is still somewhat sleepy though. He has been having a lot of mask leaking, he often takes a nap for an hour or two, when he watches tv without his pap and then wakes up and go to bed. He got his brothers mask which is a nasal pillow, and notes that this works better.  Review of detailed download tracings shows that over the past 2 nights shows improved AHI down to  2.6; and median leak of 4.8L with 95th percentile of 26.4; which would suggest that fixing the leak appears to adequately treat the OSA without a higher pressure.   Review of download data from the past month shows that he is been using his CPAP 70% of days. His average usage on days used was 4 hours and 39 minutes. His CPAP was set at a pressure of 12 with an EPR 3. His residual AHI is elevated at 11.9. He has a considerable leak with the 95th percentile 62. Immunity 0.5.  He does chewing tobacco, 4 or 5 times per day.   Sleep study 07/28/15; Severe OSA with AHI of 42.   Medication:   Reviewed    Allergies:  Lisinopril  Review of Systems: Gen:  Denies  fever, sweats, chills HEENT: Denies blurred vision, double vision. bleeds, sore throat Cvc:  No dizziness, chest pain. Resp:   Denies cough or sputum production, shortness of breath Gi: Denies swallowing difficulty, stomach pain. Gu:  Denies bladder incontinence, burning urine Ext:   No Joint pain, stiffness. Skin: No skin rash,  hives  Endoc:  No polyuria, polydipsia. Psych: No depression, insomnia. Other:  All other systems were reviewed with the patient and were negative other that what is mentioned in the HPI.   Physical Examination:   VS: BP 124/72 (BP Location: Left Arm, Cuff Size: Normal)  Pulse 65   Ht 5\' 10"  (1.778 m)   Wt 253 lb 12.8 oz (115.1 kg)   SpO2 97%   BMI 36.42 kg/m   General Appearance: No distress  Neuro:without focal findings,  speech normal,  HEENT: PERRLA, EOM intact.  [place] 2.  Pulmonary: normal breath sounds, No wheezing.  CardiovascularNormal S1,S2.  No m/r/g.   Abdomen: Benign, Soft, non-tender. Renal:  No costovertebral tenderness  GU:  No performed at this time. Endoc: No evident thyromegaly, no signs of acromegaly. Skin:   warm, no rashes, no ecchymosis  Extremities: normal, no cyanosis, clubbing.  Other findings:    LABORATORY PANEL:   CBC No results for input(s): WBC, HGB,  HCT, PLT in the last 168 hours. ------------------------------------------------------------------------------------------------------------------  Chemistries  No results for input(s): NA, K, CL, CO2, GLUCOSE, BUN, CREATININE, CALCIUM, MG, AST, ALT, ALKPHOS, BILITOT in the last 168 hours.  Invalid input(s): GFRCGP ------------------------------------------------------------------------------------------------------------------  Cardiac Enzymes No results for input(s): TROPONINI in the last 168 hours. ------------------------------------------------------------  RADIOLOGY:  No results found.     Thank  you for the consultation and for allowing [facility] to assist in the care of your patient. Our recommendations are noted above.  Please contact [facility] if we can be of further service.   [place], MD.  Board Certified in Internal Medicine, Pulmonary Medicine, Critical Care Medicine, and Sleep Medicine.  [facility] Office Number: [phone]  [phone], M.D.  Santiago Glad, M.D.  Stephanie Acre, M.D  12/20/2015

## 2015-12-21 ENCOUNTER — Encounter: Payer: Self-pay | Admitting: Internal Medicine

## 2015-12-21 ENCOUNTER — Ambulatory Visit (INDEPENDENT_AMBULATORY_CARE_PROVIDER_SITE_OTHER): Payer: Managed Care, Other (non HMO) | Admitting: Internal Medicine

## 2015-12-21 VITALS — BP 124/72 | HR 65 | Ht 70.0 in | Wt 253.8 lb

## 2015-12-21 DIAGNOSIS — G4733 Obstructive sleep apnea (adult) (pediatric): Secondary | ICD-10-CM

## 2015-12-21 NOTE — Patient Instructions (Signed)
-  We will refer to mask fitting clinic, bring your current nasal pillows mask.  -We need to get more sleep in bed, try to reduce your naptime and increase the total amount of time spent in bed wearing CPAP. Without more sleep time, CPAP may not be fully effective.  -Repeat data download in one month.

## 2016-01-04 ENCOUNTER — Ambulatory Visit (HOSPITAL_BASED_OUTPATIENT_CLINIC_OR_DEPARTMENT_OTHER): Payer: Managed Care, Other (non HMO) | Attending: Internal Medicine | Admitting: Radiology

## 2016-01-04 DIAGNOSIS — G4733 Obstructive sleep apnea (adult) (pediatric): Secondary | ICD-10-CM

## 2016-01-26 ENCOUNTER — Encounter: Payer: Self-pay | Admitting: Primary Care

## 2016-01-26 ENCOUNTER — Other Ambulatory Visit: Payer: Self-pay | Admitting: Primary Care

## 2016-01-26 ENCOUNTER — Ambulatory Visit (INDEPENDENT_AMBULATORY_CARE_PROVIDER_SITE_OTHER): Payer: Managed Care, Other (non HMO) | Admitting: Primary Care

## 2016-01-26 VITALS — BP 148/92 | HR 60 | Temp 98.1°F | Ht 70.0 in | Wt 252.1 lb

## 2016-01-26 DIAGNOSIS — E119 Type 2 diabetes mellitus without complications: Secondary | ICD-10-CM | POA: Diagnosis not present

## 2016-01-26 DIAGNOSIS — E785 Hyperlipidemia, unspecified: Secondary | ICD-10-CM

## 2016-01-26 DIAGNOSIS — I10 Essential (primary) hypertension: Secondary | ICD-10-CM | POA: Diagnosis not present

## 2016-01-26 LAB — HEPATIC FUNCTION PANEL
ALK PHOS: 34 U/L — AB (ref 39–117)
ALT: 24 U/L (ref 0–53)
AST: 19 U/L (ref 0–37)
Albumin: 4.3 g/dL (ref 3.5–5.2)
BILIRUBIN DIRECT: 0.1 mg/dL (ref 0.0–0.3)
BILIRUBIN TOTAL: 0.5 mg/dL (ref 0.2–1.2)
TOTAL PROTEIN: 7.5 g/dL (ref 6.0–8.3)

## 2016-01-26 LAB — HEMOGLOBIN A1C: Hgb A1c MFr Bld: 6.1 % (ref 4.6–6.5)

## 2016-01-26 MED ORDER — METFORMIN HCL ER 500 MG PO TB24: 500.0000 mg | ORAL_TABLET | Freq: Every day | ORAL | 3 refills | Status: DC

## 2016-01-26 MED ORDER — AMLODIPINE BESYLATE 10 MG PO TABS: 10.0000 mg | ORAL_TABLET | Freq: Every day | ORAL | 3 refills | Status: DC

## 2016-01-26 MED ORDER — ATORVASTATIN CALCIUM 10 MG PO TABS: 10.0000 mg | ORAL_TABLET | Freq: Every evening | ORAL | 3 refills | Status: DC

## 2016-01-26 NOTE — Assessment & Plan Note (Signed)
Compliant to Metformin. Poor diet, does not exercise.  Check A1C today, discussed healthy diet and exercise.

## 2016-01-26 NOTE — Patient Instructions (Addendum)
Complete lab work prior to leaving today. I will notify you of your results once received.   It is important that you improve your diet. Please limit carbohydrates in the form of white bread, rice, pasta, sweets, fast food, fried food, sugary drinks, etc. Increase your consumption of fresh fruits and vegetables, whole grains, lean protein.  Ensure you are consuming 64 ounces of water daily.  Your blood pressure is too high. Work to reduce salt in your diet. Take a look at the information below.  Monitor your blood pressure and please notify me if you see readings at or above 140/90.  Follow up in 6 months for your annual physical or sooner if needed.  It was a pleasure to see you today!  DASH Eating Plan DASH stands for "Dietary Approaches to Stop Hypertension." The DASH eating plan is a healthy eating plan that has been shown to reduce high blood pressure (hypertension). Additional health benefits may include reducing the risk of type 2 diabetes mellitus, heart disease, and stroke. The DASH eating plan may also help with weight loss. What do I need to know about the DASH eating plan? For the DASH eating plan, you will follow these general guidelines:  Choose foods with less than 150 milligrams of sodium per serving (as listed on the food label).  Use salt-free seasonings or herbs instead of table salt or sea salt.  Check with your health care provider or pharmacist before using salt substitutes.  Eat lower-sodium products. These are often labeled as "low-sodium" or "no salt added."  Eat fresh foods. Avoid eating a lot of canned foods.  Eat more vegetables, fruits, and low-fat dairy products.  Choose whole grains. Look for the word "whole" as the first word in the ingredient list.  Choose fish and skinless chicken or [place] more often than red meat. Limit fish, poultry, and meat to 6 oz (170 g) each day.  Limit sweets, desserts, sugars, and sugary drinks.  Choose heart-healthy  fats.  Eat more home-cooked food and less restaurant, buffet, and fast food.  Limit fried foods.  Do not fry foods. Cook foods using methods such as baking, boiling, grilling, and broiling instead.  When eating at a restaurant, ask that your food be prepared with less salt, or no salt if possible. What foods can I eat? Seek help from a dietitian for individual calorie needs. Grains  Whole grain or whole wheat bread. Brown rice. Whole grain or whole wheat pasta. Quinoa, bulgur, and whole grain cereals. Low-sodium cereals. Corn or whole wheat flour tortillas. Whole grain cornbread. Whole grain crackers. Low-sodium crackers. Vegetables  Fresh or frozen vegetables (raw, steamed, roasted, or grilled). Low-sodium or reduced-sodium tomato and vegetable juices. Low-sodium or reduced-sodium tomato sauce and paste. Low-sodium or reduced-sodium canned vegetables. Fruits  All fresh, canned (in natural juice), or frozen fruits. Meat and Other Protein Products  Ground beef (85% or leaner), grass-fed beef, or beef trimmed of fat. Skinless chicken or [place]. Ground chicken or [place]. Pork trimmed of fat. All fish and seafood. Eggs. Dried beans, peas, or lentils. Unsalted nuts and seeds. Unsalted canned beans. Dairy  Low-fat dairy products, such as skim or 1% milk, 2% or reduced-fat cheeses, low-fat ricotta or cottage cheese, or plain low-fat yogurt. Low-sodium or reduced-sodium cheeses. Fats and Oils  Tub margarines without trans fats. Light or reduced-fat mayonnaise and salad dressings (reduced sodium). Avocado. Safflower, olive, or canola oils. Natural peanut or almond butter. Other  Unsalted popcorn and pretzels. The items listed  above may not be a complete list of recommended foods or beverages. Contact your dietitian for more options.  What foods are not recommended? Grains  White bread. White pasta. White rice. Refined cornbread. Bagels and croissants. Crackers that contain trans fat. Vegetables   Creamed or fried vegetables. Vegetables in a cheese sauce. Regular canned vegetables. Regular canned tomato sauce and paste. Regular tomato and vegetable juices. Fruits  Canned fruit in light or heavy syrup. Fruit juice. Meat and Other Protein Products  Fatty cuts of meat. Ribs, chicken wings, bacon, sausage, bologna, salami, chitterlings, fatback, hot dogs, bratwurst, and packaged luncheon meats. Salted nuts and seeds. Canned beans with salt. Dairy  Whole or 2% milk, cream, half-and-half, and cream cheese. Whole-fat or sweetened yogurt. Full-fat cheeses or blue cheese. Nondairy creamers and whipped toppings. Processed cheese, cheese spreads, or cheese curds. Condiments  Onion and garlic salt, seasoned salt, table salt, and sea salt. Canned and packaged gravies. Worcestershire sauce. Tartar sauce. Barbecue sauce. Teriyaki sauce. Soy sauce, including reduced sodium. Steak sauce. Fish sauce. Oyster sauce. Cocktail sauce. Horseradish. Ketchup and mustard. Meat flavorings and tenderizers. Bouillon cubes. Hot sauce. Tabasco sauce. Marinades. Taco seasonings. Relishes. Fats and Oils  Butter, stick margarine, lard, shortening, ghee, and bacon fat. Coconut, palm kernel, or palm oils. Regular salad dressings. Other  Pickles and olives. Salted popcorn and pretzels. The items listed above may not be a complete list of foods and beverages to avoid. Contact your dietitian for more information.  Where can I find more information? National Heart, Lung, and Blood Institute: travelstabloid.com This information is not intended to replace advice given to you by your health care provider. Make sure you discuss any questions you have with your health care provider. Document Released: 01/12/2011 Document Revised: 07/01/2015 Document Reviewed: 11/27/2012 Elsevier Interactive Patient Education  2017 [employer].

## 2016-01-26 NOTE — Progress Notes (Signed)
Subjective:    Patient ID: Seth Larsen, male    DOB: August 28, 1960, 55 y.o.   MRN: [id number]  HPI  Seth Larsen is a 55 year old male who presents today for follow up.  1) Type 2 Diabetes: Currently managed on Metformin XR 500 mg once daily. A1C in September was 5.8. Urine microalbumin negative for damange in September. He denies dizziness, numbness tingling. He's compliant to his Metformin and is requesting a 90 day supply.  He endorses a poor diet: Breakfast: Protein bar Lunch: Sandwich, chips, fruit, occasionally fast food Dinner: [employer], sandwich, meat, junk food, some vegetables. Snacks: Fruit, cookies, junk food Desserts: Three times weekly Beverages: Diet soda, water (3-4 bottles daily)  Exercise: He does not currently exercising.   2) Hyperlipidemia: Currently managed on atorvastatin 10 mg. Lipid panel in September 2017 improved and stable. He denies myalgias.  3) Essential Hypertension: Currently managed on Amlodipine 10 mg. His BP in the office today is 148/92. He endorses increased stress at work. He denies chest pain, dizziness, visual changes.  Review of Systems  Eyes: Negative for visual disturbance.  Respiratory: Negative for shortness of breath.   Cardiovascular: Negative for chest pain.  Endocrine: Negative for polyuria.  Musculoskeletal: Negative for myalgias.  Neurological: Negative for dizziness and headaches.       Past Medical History:  Diagnosis Date  . Hypertension      Social History   Social History  . Marital status: Married    Spouse name: N/A  . Number of children: N/A  . Years of education: N/A   Occupational History  . Not on file.   Social History Main Topics  . Smoking status: Never Smoker  . Smokeless tobacco: Current User    Types: Chew  . Alcohol use No  . Drug use: No  . Sexual activity: Not on file   Other Topics Concern  . Not on file   Social History Narrative   Married.   Works at numerous occupations.    Enjoys tending to his animals.     Past Surgical History:  Procedure Laterality Date  . BACK SURGERY    . CARPAL TUNNEL RELEASE    . KNEE SURGERY    . TOTAL KNEE ARTHROPLASTY      Family History  Problem Relation Age of Onset  . Hypertension Father   . Alzheimer's disease Father   . Diabetes Brother   . Hypertension Mother   . Breast cancer Mother   . Breast cancer Sister     Allergies  Allergen Reactions  . Lisinopril Other (See Comments)    Myalgias, urinary frequency    Current Outpatient Prescriptions on File Prior to Visit  Medication Sig Dispense Refill  . acetaminophen (TYLENOL) 500 MG tablet Take 500 mg by mouth every 6 (six) hours as needed.    Marland Kitchen amLODipine (NORVASC) 10 MG tablet TAKE 1 TABLET BY MOUTH DAILY 30 tablet 5  . atorvastatin (LIPITOR) 10 MG tablet Take 1 tablet (10 mg total) by mouth every evening. 90 tablet 3  . metFORMIN (GLUCOPHAGE XR) 500 MG 24 hr tablet Take 1 tablet (500 mg total) by mouth daily with breakfast. 90 tablet 2   No current facility-administered medications on file prior to visit.     There were no vitals taken for this visit.   Objective:   Physical Exam  Constitutional: He appears well-nourished.  Neck: Neck supple.  Cardiovascular: Normal rate and regular rhythm.   Pulmonary/Chest: Effort normal  and breath sounds normal.  Skin: Skin is warm and dry.          Assessment & Plan:

## 2016-01-26 NOTE — Assessment & Plan Note (Signed)
Above goal today, increased stress at work. Will have him work on weight loss, low salt diet, exercise. If no improvement by next visit, will add in second medication.

## 2016-01-26 NOTE — Assessment & Plan Note (Signed)
Stable on atorvastatin. Check LFT's today. Denies myalgias.

## 2016-01-27 ENCOUNTER — Encounter: Payer: Self-pay | Admitting: *Deleted

## 2016-02-11 ENCOUNTER — Telehealth: Payer: Self-pay

## 2016-02-11 ENCOUNTER — Ambulatory Visit (INDEPENDENT_AMBULATORY_CARE_PROVIDER_SITE_OTHER): Payer: 59 | Admitting: Family Medicine

## 2016-02-11 ENCOUNTER — Encounter: Payer: Self-pay | Admitting: Family Medicine

## 2016-02-11 VITALS — BP 158/70 | HR 75 | Temp 98.3°F | Ht 70.0 in | Wt 257.2 lb

## 2016-02-11 DIAGNOSIS — J028 Acute pharyngitis due to other specified organisms: Secondary | ICD-10-CM | POA: Diagnosis not present

## 2016-02-11 DIAGNOSIS — J029 Acute pharyngitis, unspecified: Secondary | ICD-10-CM

## 2016-02-11 LAB — POCT RAPID STREP A (OFFICE): Rapid Strep A Screen: NEGATIVE

## 2016-02-11 NOTE — Patient Instructions (Addendum)
Rest, fluids. Expect 7-10 days or more of illness.  Ibuprofen 800 mg every eight hours for pain in throat.  Call for viscous lidocaine to numb throat if severe.

## 2016-02-11 NOTE — Progress Notes (Signed)
   Subjective:    Patient ID: Seth Larsen, male    DOB: 1960/08/28, 56 y.o.   MRN: [id number]  Sore Throat   This is a new problem. The current episode started in the past 7 days ( 2 day ago soret throat). The problem has been rapidly worsening. Neither side of throat is experiencing more pain than the other. There has been no fever. The pain is severe. Associated symptoms include congestion, headaches and trouble swallowing. Pertinent negatives include no abdominal pain, coughing, drooling, ear discharge, ear pain, plugged ear sensation, neck pain, shortness of breath or swollen glands. Associated symptoms comments: Mild runny nose. He has had no exposure to strep or mono. He has tried NSAIDs ( nasal decongestant, ibuprofen, coricidin) for the symptoms. The treatment provided mild relief.    Recent stomach virus over christmas.. Chills, diarrhea.  resolved.  Review of Systems  HENT: Positive for congestion and trouble swallowing. Negative for drooling, ear discharge and ear pain.   Respiratory: Negative for cough and shortness of breath.   Gastrointestinal: Negative for abdominal pain.  Musculoskeletal: Negative for neck pain.  Neurological: Positive for headaches.    No body aches.     No smoker  No COPD, asthma  Does have DM. Objective:   Physical Exam  Constitutional: Vital signs are normal. He appears well-developed and well-nourished.  Non-toxic appearance. He does not appear ill. No distress.  HENT:  Head: Normocephalic and atraumatic.  Right Ear: Hearing, tympanic membrane, external ear and ear canal normal. No tenderness. No foreign bodies. Tympanic membrane is not retracted and not bulging.  Left Ear: Hearing, tympanic membrane, external ear and ear canal normal. No tenderness. No foreign bodies. Tympanic membrane is not retracted and not bulging.  Nose: Nose normal. No mucosal edema or rhinorrhea. Right sinus exhibits no maxillary sinus tenderness and no frontal sinus  tenderness. Left sinus exhibits no maxillary sinus tenderness and no frontal sinus tenderness.  Mouth/Throat: Uvula is midline and mucous membranes are normal. Normal dentition. No dental caries. Posterior oropharyngeal erythema present. No oropharyngeal exudate, posterior oropharyngeal edema or tonsillar abscesses.  Eyes: Conjunctivae, EOM and lids are normal. Pupils are equal, round, and reactive to light. Lids are everted and swept, no foreign bodies found.  Neck: Trachea normal, normal range of motion and phonation normal. Neck supple. Carotid bruit is not present. No thyroid mass and no thyromegaly present.  Cardiovascular: Normal rate, regular rhythm, S1 normal, S2 normal, normal heart sounds, intact distal pulses and normal pulses.  Exam reveals no gallop.   No murmur heard. Pulmonary/Chest: Effort normal and breath sounds normal. No respiratory distress. He has no wheezes. He has no rhonchi. He has no rales.  Abdominal: Soft. Normal appearance and bowel sounds are normal. There is no hepatosplenomegaly. There is no tenderness. There is no rebound, no guarding and no CVA tenderness. No hernia.  Neurological: He is alert. He has normal reflexes.  Skin: Skin is warm, dry and intact. No rash noted.  Psychiatric: He has a normal mood and affect. His speech is normal and behavior is normal. Judgment normal.          Assessment & Plan:

## 2016-02-11 NOTE — Progress Notes (Signed)
Pre visit review using our clinic review tool, if applicable. No additional management support is needed unless otherwise documented below in the visit note. 

## 2016-02-11 NOTE — Telephone Encounter (Signed)
Pt already has appt today to be seen; pt wanted to know if med could be called in to [facility] so pt would not have to come to office. Pt advises his throat is so sore he is trying not to swallow. Advised pt he does need to be seen and may need strep test done and to be evaluated to determine what treatment needs to be done. Pt voiced understanding and can wait for appt today at 3.

## 2016-02-11 NOTE — Assessment & Plan Note (Signed)
Neg strep test. Likely viral in origin. Symptomatic care.

## 2016-03-23 LAB — HM DIABETES EYE EXAM

## 2016-05-17 ENCOUNTER — Encounter: Payer: Self-pay | Admitting: Primary Care

## 2016-07-24 ENCOUNTER — Encounter: Payer: Managed Care, Other (non HMO) | Admitting: Primary Care

## 2017-04-20 ENCOUNTER — Other Ambulatory Visit: Payer: Self-pay | Admitting: Primary Care

## 2017-04-20 ENCOUNTER — Ambulatory Visit: Payer: 59 | Admitting: Primary Care

## 2017-04-20 ENCOUNTER — Encounter: Payer: Self-pay | Admitting: Primary Care

## 2017-04-20 VITALS — BP 140/82 | HR 70 | Temp 98.0°F | Ht 70.0 in | Wt 263.2 lb

## 2017-04-20 DIAGNOSIS — E119 Type 2 diabetes mellitus without complications: Secondary | ICD-10-CM | POA: Diagnosis not present

## 2017-04-20 DIAGNOSIS — R350 Frequency of micturition: Secondary | ICD-10-CM

## 2017-04-20 DIAGNOSIS — E785 Hyperlipidemia, unspecified: Secondary | ICD-10-CM

## 2017-04-20 DIAGNOSIS — I10 Essential (primary) hypertension: Secondary | ICD-10-CM

## 2017-04-20 LAB — COMPREHENSIVE METABOLIC PANEL
ALBUMIN: 4.3 g/dL (ref 3.5–5.2)
ALK PHOS: 32 U/L — AB (ref 39–117)
ALT: 22 U/L (ref 0–53)
AST: 14 U/L (ref 0–37)
BUN: 16 mg/dL (ref 6–23)
CALCIUM: 9.6 mg/dL (ref 8.4–10.5)
CO2: 28 mEq/L (ref 19–32)
CREATININE: 0.84 mg/dL (ref 0.40–1.50)
Chloride: 101 mEq/L (ref 96–112)
GFR: 99.56 mL/min (ref >60.00)
GLUCOSE: 103 mg/dL — AB (ref 70–99)
Potassium: 4.7 mEq/L (ref 3.5–5.1)
Sodium: 138 mEq/L (ref 135–145)
Total Bilirubin: 0.5 mg/dL (ref 0.2–1.2)
Total Protein: 7.2 g/dL (ref 6.0–8.3)

## 2017-04-20 LAB — LIPID PANEL
Cholesterol: 125 mg/dL (ref 0–200)
HDL: 41.8 mg/dL (ref >39.00)
LDL CALC: 66 mg/dL (ref 0–99)
NONHDL: 83.59
Total CHOL/HDL Ratio: 3
Triglycerides: 86 mg/dL (ref 0.0–149.0)
VLDL: 17.2 mg/dL (ref 0.0–40.0)

## 2017-04-20 LAB — MICROALBUMIN / CREATININE URINE RATIO
CREATININE, U: 138.3 mg/dL
Microalb Creat Ratio: 0.7 mg/g (ref 0.0–30.0)
Microalb, Ur: 1 mg/dL (ref 0.0–1.9)

## 2017-04-20 LAB — PSA: PSA: 0.99 ng/mL (ref 0.10–4.00)

## 2017-04-20 LAB — HEMOGLOBIN A1C: HEMOGLOBIN A1C: 6.3 % (ref 4.6–6.5)

## 2017-04-20 MED ORDER — AMLODIPINE BESYLATE 10 MG PO TABS: 10.0000 mg | ORAL_TABLET | Freq: Every day | ORAL | 3 refills | Status: DC

## 2017-04-20 MED ORDER — ATORVASTATIN CALCIUM 10 MG PO TABS: 10.0000 mg | ORAL_TABLET | Freq: Every evening | ORAL | 3 refills | Status: DC

## 2017-04-20 MED ORDER — GABAPENTIN 100 MG PO CAPS: ORAL_CAPSULE | ORAL | 0 refills | Status: DC

## 2017-04-20 MED ORDER — METFORMIN HCL ER 500 MG PO TB24: 500.0000 mg | ORAL_TABLET | Freq: Every day | ORAL | 3 refills | Status: DC

## 2017-04-20 NOTE — Patient Instructions (Addendum)
You can try gabapentin 100 mg capsules for foot pain. Take 1-3 capsules by mouth at bedtime for foot pain.  I'll send the rest of your refills to your pharmacy later this afternoon once I receive your lab results.  Start monitoring your blood pressure daily, around the same time of day, for the next several weeks.  Ensure that you have rested for 30 minutes prior to checking your blood pressure. Record your readings and notify me of consistent readings at or above 140/90.  Start exercising. You should be getting 150 minutes of moderate intensity exercise weekly.  It's important to improve your diet by reducing consumption of fast food, fried food, processed snack foods, sugary drinks. Increase consumption of fresh vegetables and fruits, whole grains, water.  Ensure you are drinking 64 ounces of water daily.  Please schedule a follow up appointment in 6 months.   It was a pleasure to see you today!

## 2017-04-20 NOTE — Assessment & Plan Note (Signed)
Overdue for follow up. Suspect urinary frequency and plantar foot pain/tinlging secondary to uncontrolled diabetes.  Urine microalbumin, A1C, lipids, PSA, CMP pending. Foot exam today unremarkable. Pneumonia vaccination UTD. Will send refills once labs result.   Rx for low dose gabapentin sent for likely neuropathy to feet. Could still be plantar fasciitis.  Discussed the importance of a healthy diet and regular exercise in order for weight loss, and to reduce the risk of any potential medical problems. Follow up in 6 weeks or sooner if lab results show a need for sooner follow up.

## 2017-04-20 NOTE — Progress Notes (Signed)
Subjective:    Patient ID: Seth Larsen, male    DOB: 17-Dec-1960, 57 y.o.   MRN: [id number]  HPI  Seth Larsen is a 57 year old male who presents today for follow up.  1) Type 2 Diabetes:  Current medications include: Metformin XR 500 mg. He has been taking his Metformin irregularly for the past one year. Over the past one month he's taken his medication daily, but ran out of his medication about 2 weeks ago.   He has noticed intermittent tingling to his plantar feet, mostly right sometimes left. This will last for several days. He's tried changing insoles and shoes without improvement. He denies tingling to the lower extremities.   He's also noticed increased urinary frequency over the past 1 year which has progressed. He's urinating frequently during the day and also several times at night.   Last A1C: 6.1 in December 2017 Last Eye Exam: Due this year. Completed in February 2018 Last Foot Exam: Due today. Pneumonia Vaccination: Completed in 2017 ACE/ARB: Urine microalbumin pending Statin: Atorvastatin 10 mg    2) Essential Hypertension: Currently managed on Amlodipine 10 mg. He's been taking his medication irregularly over the past 1 year, more frequently over the past one month, ran out 2 weeks ago. He denies chest pain, shortness of breath, dizziness, headaches.   BP Readings from Last 3 Encounters:  04/20/17 140/82  02/11/16 (!) 158/70  01/26/16 (!) 148/92    3) Hyperlipidemia: Currently managed on atorvastatin 10 mg. Lipid panel from September 2017 with LDL of 65, TC of 119. He has not taken his medication regularly over the past 1 year, more consistently over the past 1 month, ran out 2 weeks ago.     Review of Systems  Eyes: Negative for visual disturbance.  Respiratory: Negative for shortness of breath.   Cardiovascular: Negative for chest pain.  Genitourinary: Positive for frequency. Negative for decreased urine volume, difficulty urinating and dysuria.    Neurological: Negative for dizziness and headaches.       Bilateral plantar foot pain/tingling       Past Medical History:  Diagnosis Date  . Hypertension      Social History   Socioeconomic History  . Marital status: Married    Spouse name: Not on file  . Number of children: Not on file  . Years of education: Not on file  . Highest education level: Not on file  Social Needs  . Financial resource strain: Not on file  . Food insecurity - worry: Not on file  . Food insecurity - inability: Not on file  . Transportation needs - medical: Not on file  . Transportation needs - non-medical: Not on file  Occupational History  . Not on file  Tobacco Use  . Smoking status: Never Smoker  . Smokeless tobacco: Current User    Types: Chew  Substance and Sexual Activity  . Alcohol use: No    Alcohol/week: 0.0 oz  . Drug use: No  . Sexual activity: Not on file  Other Topics Concern  . Not on file  Social History Narrative   Married.   Works at numerous occupations.    Enjoys tending to his animals.     Past Surgical History:  Procedure Laterality Date  . BACK SURGERY    . CARPAL TUNNEL RELEASE    . KNEE SURGERY    . TOTAL KNEE ARTHROPLASTY      Family History  Problem Relation Age of Onset  .  Hypertension Father   . Alzheimer's disease Father   . Diabetes Brother   . Hypertension Mother   . Breast cancer Mother   . Breast cancer Sister     Allergies  Allergen Reactions  . Lisinopril Other (See Comments)    Myalgias, urinary frequency    Current Outpatient Medications on File Prior to Visit  Medication Sig Dispense Refill  . acetaminophen (TYLENOL) 500 MG tablet Take 500 mg by mouth every 6 (six) hours as needed.    Marland Kitchen atorvastatin (LIPITOR) 10 MG tablet Take 1 tablet (10 mg total) by mouth every evening. (Patient not taking: Reported on 04/20/2017) 90 tablet 3  . metFORMIN (GLUCOPHAGE XR) 500 MG 24 hr tablet Take 1 tablet (500 mg total) by mouth daily with  breakfast. (Patient not taking: Reported on 04/20/2017) 90 tablet 3   No current facility-administered medications on file prior to visit.     BP 140/82   Pulse 70   Temp 98 F (36.7 C) (Oral)   Ht 5\' 10"  (1.778 m)   Wt 263 lb 4 oz (119.4 kg)   SpO2 97%   BMI 37.77 kg/m    Objective:   Physical Exam  Constitutional: He is oriented to person, place, and time. He appears well-nourished.  Neck: Neck supple.  Cardiovascular: Normal rate and regular rhythm.  Pulmonary/Chest: Effort normal and breath sounds normal. He has no wheezes. He has no rales.  Neurological: He is alert and oriented to person, place, and time.  Skin: Skin is warm and dry.  Psychiatric: He has a normal mood and affect.          Assessment & Plan:

## 2017-04-20 NOTE — Assessment & Plan Note (Signed)
Above goal today, ran out of meds 2 weeks ago. Refill for Amlodipine sent to pharmacy.   Discussed to monitor BP and report readings at or above 140/90.

## 2017-04-20 NOTE — Assessment & Plan Note (Signed)
Out of meds for the past 2 weeks, taking irregularly over past 1 year. Repeat lipids pending today. Will send refills once labs return.

## 2017-04-23 ENCOUNTER — Encounter: Payer: Self-pay | Admitting: *Deleted

## 2017-05-18 ENCOUNTER — Other Ambulatory Visit: Payer: Self-pay | Admitting: Primary Care

## 2017-05-18 DIAGNOSIS — E119 Type 2 diabetes mellitus without complications: Secondary | ICD-10-CM

## 2017-06-23 ENCOUNTER — Other Ambulatory Visit: Payer: Self-pay | Admitting: Primary Care

## 2017-06-23 DIAGNOSIS — E119 Type 2 diabetes mellitus without complications: Secondary | ICD-10-CM

## 2017-06-25 NOTE — Telephone Encounter (Signed)
Electronic refill request Last refill 05/18/17 #90 Last office visit 04/20/17

## 2017-06-25 NOTE — Telephone Encounter (Signed)
Please call patient: Is the gabapentin helping with his foot pain? How many capsules is he taking at bedtime?

## 2017-06-25 NOTE — Telephone Encounter (Signed)
Per DPR, left detail message of Kate Clark's comments for patient to call back 

## 2017-06-26 NOTE — Telephone Encounter (Signed)
Pt returning call to Chan °

## 2017-06-26 NOTE — Telephone Encounter (Signed)
Message left for patient to return my call.  

## 2017-06-26 NOTE — Telephone Encounter (Signed)
Noted, refills sent to pharmacy. 

## 2017-06-26 NOTE — Telephone Encounter (Signed)
Spoken to patient.  Is the gabapentin helping with his foot pain?  Yes, this helps with the foot pain, the pain does not go away completely but much better with this medication.  How many capsules is he taking at bedtime?  Patient takes 2 capsules at bedtime most nights and once a while, depend how long he has been on his feet, he will take 3 capsules at bedtime.

## 2017-10-23 ENCOUNTER — Encounter: Payer: Self-pay | Admitting: Primary Care

## 2017-10-23 ENCOUNTER — Ambulatory Visit: Payer: 59 | Admitting: Primary Care

## 2017-10-23 VITALS — BP 146/76 | HR 64 | Temp 98.5°F | Ht 70.0 in | Wt 270.0 lb

## 2017-10-23 DIAGNOSIS — E785 Hyperlipidemia, unspecified: Secondary | ICD-10-CM | POA: Diagnosis not present

## 2017-10-23 DIAGNOSIS — G4733 Obstructive sleep apnea (adult) (pediatric): Secondary | ICD-10-CM

## 2017-10-23 DIAGNOSIS — I10 Essential (primary) hypertension: Secondary | ICD-10-CM

## 2017-10-23 DIAGNOSIS — E119 Type 2 diabetes mellitus without complications: Secondary | ICD-10-CM

## 2017-10-23 LAB — POCT GLYCOSYLATED HEMOGLOBIN (HGB A1C): Hemoglobin A1C: 6.1 % — AB (ref 4.0–5.6)

## 2017-10-23 MED ORDER — LOSARTAN POTASSIUM 50 MG PO TABS: ORAL_TABLET | ORAL | 0 refills | Status: DC

## 2017-10-23 NOTE — Assessment & Plan Note (Signed)
Not compliant to CPAP machine as he's sleeping 4 hours due to work hours. Strongly advised he try to get to bed earlier so that he can utilize his CPAP, he does wake at 2:15 am for work.

## 2017-10-23 NOTE — Assessment & Plan Note (Signed)
Above goal in the office today, also on several prior visits. Continue Amlodipine 10 mg, add on Losartan 50 mg for better control and for renal protection.  Will have him monitor BP over the next 2-3 weeks, return at that time for follow up and BMP.   Strongly advised he work on diet and start an exercise regimen.

## 2017-10-23 NOTE — Progress Notes (Signed)
Subjective:    Patient ID: Seth Larsen, male    DOB: [birth year], 57 y.o.   MRN: [id number]  HPI  Seth Larsen is a 57 year old male who presents today for follow up.  1) Type 2 Diabetes: Current medications include: Metformin XR 500 mg once daily. Also taking gabapentin 200 mg HS for right foot pain and is feeling much better. He is compliant to his Lipitor.    Last A1C: 6.3 in March 2019 Last Eye Exam: Not completed  Last Foot Exam: Due in March 2020 Pneumonia Vaccination: Completed in 2017 ACE/ARB: Urine microalbumin negative in March 2019 Statin: atorvastatin   Diet currently consists of:  Breakfast: Protein bar, fruit, sometimes fast food Lunch: Sandwich, fruit, chips Dinner: Sandwiches, sometimes restaurant food Snacks: Fruit, cookie, nuts Desserts: Daily  Beverages: Diet soda, water with flavor, rare sweet tea  Exercise: He is not exercising   2) Essential Hypertension: He's been checking his BP at home which has been running 140's-150's/70-80's. He is currently managed on Amlodipine 10 mg daily. He denies chest pain. He is not using his CPAP machine as he's sleeping 4 hours nightly. He was told that the machine won't do any good unless he gets 6 hours of sleep at night.   BP Readings from Last 3 Encounters:  10/23/17 (!) 146/76  04/20/17 140/82  02/11/16 (!) 158/70     3) Jaw Pain: Has noticed cramping and soreness to right jaw during the day. He does chew tobacco most everyday during the week. He did recently see his dentist who thought he had recently quit chewing tobacco. He does not wear a CPAP machine at night for reasons mentioned above. He has not tried a mouth guard. He denies chest pain.   Review of Systems  Eyes: Negative for visual disturbance.  Respiratory: Negative for shortness of breath.   Cardiovascular: Negative for chest pain.  Musculoskeletal:       Right jaw cramping, chronic bilateral knee pain  Neurological: Negative for dizziness and  headaches.       Right foot numbness and pain has improved       Past Medical History:  Diagnosis Date  . Hypertension   . Type 2 diabetes mellitus (HCC)      Social History   Socioeconomic History  . Marital status: Married    Spouse name: Not on file  . Number of children: Not on file  . Years of education: Not on file  . Highest education level: Not on file  Occupational History  . Not on file  Social Needs  . Financial resource strain: Not on file  . Food insecurity:    Worry: Not on file    Inability: Not on file  . Transportation needs:    Medical: Not on file    Non-medical: Not on file  Tobacco Use  . Smoking status: Never Smoker  . Smokeless tobacco: Current User    Types: Chew  Substance and Sexual Activity  . Alcohol use: No    Alcohol/week: 0.0 standard drinks  . Drug use: No  . Sexual activity: Not on file  Lifestyle  . Physical activity:    Days per week: Not on file    Minutes per session: Not on file  . Stress: Not on file  Relationships  . Social connections:    Talks on phone: Not on file    Gets together: Not on file    Attends religious service: Not on file  Active member of club or organization: Not on file    Attends meetings of clubs or organizations: Not on file    Relationship status: Not on file  . Intimate partner violence:    Fear of current or ex partner: Not on file    Emotionally abused: Not on file    Physically abused: Not on file    Forced sexual activity: Not on file  Other Topics Concern  . Not on file  Social History Narrative   Married.   Works at numerous occupations.    Enjoys tending to his animals.     Past Surgical History:  Procedure Laterality Date  . BACK SURGERY    . CARPAL TUNNEL RELEASE    . KNEE SURGERY    . TOTAL KNEE ARTHROPLASTY      Family History  Problem Relation Age of Onset  . Hypertension Father   . Alzheimer's disease Father   . Diabetes Brother   . Hypertension Mother   .  Breast cancer Mother   . Breast cancer Sister     Allergies  Allergen Reactions  . Lisinopril Other (See Comments)    Myalgias, urinary frequency    Current Outpatient Medications on File Prior to Visit  Medication Sig Dispense Refill  . acetaminophen (TYLENOL) 500 MG tablet Take 500 mg by mouth every 6 (six) hours as needed.    Marland Kitchen amLODipine (NORVASC) 10 MG tablet Take 1 tablet (10 mg total) by mouth daily. 90 tablet 3  . atorvastatin (LIPITOR) 10 MG tablet Take 1 tablet (10 mg total) by mouth every evening. 90 tablet 3  . gabapentin (NEURONTIN) 100 MG capsule TAKE 1-3 CAPSULES BY MOUTH AT BEDTIME FOR FOOT PAIN. 270 capsule 1  . metFORMIN (GLUCOPHAGE XR) 500 MG 24 hr tablet Take 1 tablet (500 mg total) by mouth daily with breakfast. 90 tablet 3   No current facility-administered medications on file prior to visit.     BP (!) 146/76   Pulse 64   Temp 98.5 F (36.9 C) (Oral)   Ht 5\' 10"  (1.778 m)   Wt 270 lb (122.5 kg)   SpO2 95%   BMI 38.74 kg/m    Objective:   Physical Exam  Constitutional: He appears well-nourished.  HENT:  Mouth/Throat: Oropharynx is clear and moist.  No abnormality noted to oral cavity  Neck: Neck supple. Carotid bruit is not present.  Cardiovascular: Normal rate and regular rhythm.  Respiratory: Effort normal and breath sounds normal.  Skin: Skin is warm and dry.           Assessment & Plan:

## 2017-10-23 NOTE — Assessment & Plan Note (Signed)
Stable on atorvastatin 10 mg. Consider a 2 week drug holiday given jaw cramping. He will start with a nightguard first. Consider switching to Crestor if needed.

## 2017-10-23 NOTE — Assessment & Plan Note (Signed)
A1C of 6.1 on today's lab. Continue Metformin XR 500 mg daily. Continue statin. Add Losartan today for BP control and renal protection. Pneumonia vaccination UTD. Advised he schedule an eye appointment. Continue gabapentin for neuropathy.   Follow up in 6 months.

## 2017-10-23 NOTE — Patient Instructions (Signed)
Try a mouth guard while sleeping at night, you may be clenching your teeth during sleep.   Try to increase sleep if possible, resume your CPAP machine.  Start losartan 50 mg tablets for blood pressure. Take 1 tablet by mouth once daily.  Stop by the lab prior to leaving today. I will notify you of your results once received.   Try to get some exercise, work on your diet.   Start monitoring your blood pressure daily, around the same time of day, for the next 2-3 weeks.  Ensure that you have rested for 30 minutes prior to checking your blood pressure. Record your readings and bring them to your next visit.  Schedule a follow up visit in 2-3 weeks for blood pressure check.  It was a pleasure to see you today!   Diabetes Mellitus and Nutrition When you have diabetes (diabetes mellitus), it is very important to have healthy eating habits because your blood sugar (glucose) levels are greatly affected by what you eat and drink. Eating healthy foods in the appropriate amounts, at about the same times every day, can help you:  Control your blood glucose.  Lower your risk of heart disease.  Improve your blood pressure.  Reach or maintain a healthy weight.  Every person with diabetes is different, and each person has different needs for a meal plan. Your health care provider may recommend that you work with a diet and nutrition specialist (dietitian) to make a meal plan that is best for you. Your meal plan may vary depending on factors such as:  The calories you need.  The medicines you take.  Your weight.  Your blood glucose, blood pressure, and cholesterol levels.  Your activity level.  Other health conditions you have, such as heart or kidney disease.  How do carbohydrates affect me? Carbohydrates affect your blood glucose level more than any other type of food. Eating carbohydrates naturally increases the amount of glucose in your blood. Carbohydrate counting is a method for  keeping track of how many carbohydrates you eat. Counting carbohydrates is important to keep your blood glucose at a healthy level, especially if you use insulin or take certain oral diabetes medicines. It is important to know how many carbohydrates you can safely have in each meal. This is different for every person. Your dietitian can help you calculate how many carbohydrates you should have at each meal and for snack. Foods that contain carbohydrates include:  Bread, cereal, rice, pasta, and crackers.  Potatoes and corn.  Peas, beans, and lentils.  Milk and yogurt.  Fruit and juice.  Desserts, such as cakes, cookies, ice cream, and candy.  How does alcohol affect me? Alcohol can cause a sudden decrease in blood glucose (hypoglycemia), especially if you use insulin or take certain oral diabetes medicines. Hypoglycemia can be a life-threatening condition. Symptoms of hypoglycemia (sleepiness, dizziness, and confusion) are similar to symptoms of having too much alcohol. If your health care provider says that alcohol is safe for you, follow these guidelines:  Limit alcohol intake to no more than 1 drink per day for nonpregnant women and 2 drinks per day for men. One drink equals 12 oz of beer, 5 oz of wine, or 1 oz of hard liquor.  Do not drink on an empty stomach.  Keep yourself hydrated with water, diet soda, or unsweetened iced tea.  Keep in mind that regular soda, juice, and other mixers may contain a lot of sugar and must be counted as carbohydrates.  What are tips for following this plan? Reading food labels  Start by checking the serving size on the label. The amount of calories, carbohydrates, fats, and other nutrients listed on the label are based on one serving of the food. Many foods contain more than one serving per package.  Check the total grams (g) of carbohydrates in one serving. You can calculate the number of servings of carbohydrates in one serving by dividing the  total carbohydrates by 15. For example, if a food has 30 g of total carbohydrates, it would be equal to 2 servings of carbohydrates.  Check the number of grams (g) of saturated and trans fats in one serving. Choose foods that have low or no amount of these fats.  Check the number of milligrams (mg) of sodium in one serving. Most people should limit total sodium intake to less than 2,300 mg per day.  Always check the nutrition information of foods labeled as "low-fat" or "nonfat". These foods may be higher in added sugar or refined carbohydrates and should be avoided.  Talk to your dietitian to identify your daily goals for nutrients listed on the label. Shopping  Avoid buying canned, premade, or processed foods. These foods tend to be high in fat, sodium, and added sugar.  Shop around the outside edge of the grocery store. This includes fresh fruits and vegetables, bulk grains, fresh meats, and fresh dairy. Cooking  Use low-heat cooking methods, such as baking, instead of high-heat cooking methods like deep frying.  Cook using healthy oils, such as olive, canola, or sunflower oil.  Avoid cooking with butter, cream, or high-fat meats. Meal planning  Eat meals and snacks regularly, preferably at the same times every day. Avoid going long periods of time without eating.  Eat foods high in fiber, such as fresh fruits, vegetables, beans, and whole grains. Talk to your dietitian about how many servings of carbohydrates you can eat at each meal.  Eat 4-6 ounces of lean protein each day, such as lean meat, chicken, fish, eggs, or tofu. 1 ounce is equal to 1 ounce of meat, chicken, or fish, 1 egg, or 1/4 cup of tofu.  Eat some foods each day that contain healthy fats, such as avocado, nuts, seeds, and fish. Lifestyle   Check your blood glucose regularly.  Exercise at least 30 minutes 5 or more days each week, or as told by your health care provider.  Take medicines as told by your health  care provider.  Do not use any products that contain nicotine or tobacco, such as cigarettes and e-cigarettes. If you need help quitting, ask your health care provider.  Work with a Veterinary surgeon or diabetes educator to identify strategies to manage stress and any emotional and social challenges. What are some questions to ask my health care provider?  Do I need to meet with a diabetes educator?  Do I need to meet with a dietitian?  What number can I call if I have questions?  When are the best times to check my blood glucose? Where to find more information:  American Diabetes Association: diabetes.org/food-and-fitness/food  Academy of Nutrition and Dietetics: https://www.vargas.com/  [employer] of Diabetes and Digestive and Kidney Diseases (NIH): FindJewelers.cz Summary  A healthy meal plan will help you control your blood glucose and maintain a healthy lifestyle.  Working with a diet and nutrition specialist (dietitian) can help you make a meal plan that is best for you.  Keep in mind that carbohydrates and alcohol have immediate  effects on your blood glucose levels. It is important to count carbohydrates and to use alcohol carefully. This information is not intended to replace advice given to you by your health care provider. Make sure you discuss any questions you have with your health care provider. Document Released: 10/20/2004 Document Revised: 02/28/2016 Document Reviewed: 02/28/2016 Elsevier Interactive Patient Education  [employer].

## 2017-11-14 ENCOUNTER — Other Ambulatory Visit: Payer: Self-pay | Admitting: Primary Care

## 2017-11-14 DIAGNOSIS — I10 Essential (primary) hypertension: Secondary | ICD-10-CM

## 2017-11-15 ENCOUNTER — Ambulatory Visit: Payer: 59 | Admitting: Primary Care

## 2017-11-15 ENCOUNTER — Telehealth: Payer: Self-pay | Admitting: Primary Care

## 2017-11-15 VITALS — BP 146/76 | HR 74 | Temp 98.2°F | Ht 70.0 in | Wt 272.2 lb

## 2017-11-15 DIAGNOSIS — I10 Essential (primary) hypertension: Secondary | ICD-10-CM | POA: Diagnosis not present

## 2017-11-15 LAB — BASIC METABOLIC PANEL
BUN: 14 mg/dL (ref 6–23)
CHLORIDE: 100 meq/L (ref 96–112)
CO2: 31 meq/L (ref 19–32)
Calcium: 9.7 mg/dL (ref 8.4–10.5)
Creatinine, Ser: 0.81 mg/dL (ref 0.40–1.50)
GFR: 103.62 mL/min (ref >60.00)
Glucose, Bld: 116 mg/dL — ABNORMAL HIGH (ref 70–99)
Potassium: 5 mEq/L (ref 3.5–5.1)
SODIUM: 139 meq/L (ref 135–145)

## 2017-11-15 NOTE — Patient Instructions (Addendum)
Continue amlodipine 10 mg and losartan 50 mg.  I will contact your pharmacy in regards to the losartan dose. I will send refills once I confirm your dose.  Continue to monitor your blood pressure occasionally, report readings that are at or above 135/90.  Please schedule a follow up appointment in 6 months for diabetes check.  It was a pleasure to see you today!

## 2017-11-15 NOTE — Telephone Encounter (Signed)
Noted and refill sent to pharmacy. Please notify patient that he is taking the correct amount and to continue taking two tablets. Will you also explain to him that the losartan 50 mg is on backorder?

## 2017-11-15 NOTE — Telephone Encounter (Signed)
Patient seen today and reports he's taking "two losartan tablets". Please verify if the pharmacy gave him 25 mg or 50 mg tablets. Thanks!

## 2017-11-15 NOTE — Telephone Encounter (Signed)
Spoken [facility]. Losartan 50 mg are on back order and still on are.  Patient was given 25 mg on the last refill. He will be needing another refill as well. There is a refill request.

## 2017-11-15 NOTE — Progress Notes (Signed)
Subjective:    Patient ID: Seth Larsen, male    DOB: 1960/12/13, 57 y.o.   MRN: [id number]  HPI  Seth Larsen is a 57 year old male who presents today for follow up of hypertension.   He was last evaluated in mid September with elevated blood pressure readings while managed on Amlodipine 10 mg. Given uncontrolled readings and history of diabetes losartan 50 mg was added to his regimen.  Since his last visit he's checking his BP at home which is running 110's-130's/50's-60's. He denies dizziness, chest pain, shortness of breath. He is compliant to amlodipine 10 mg and losartan. He is taking "two losartan pills" but thinks he received 25 mg at the pharmacy.   BP Readings from Last 3 Encounters:  11/15/17 (!) 146/76  10/23/17 (!) 146/76  04/20/17 140/82     Review of Systems  Respiratory: Negative for shortness of breath.   Cardiovascular: Negative for chest pain.  Neurological: Negative for dizziness and headaches.       Past Medical History:  Diagnosis Date  . Hypertension   . Type 2 diabetes mellitus (HCC)      Social History   Socioeconomic History  . Marital status: Married    Spouse name: Not on file  . Number of children: Not on file  . Years of education: Not on file  . Highest education level: Not on file  Occupational History  . Not on file  Social Needs  . Financial resource strain: Not on file  . Food insecurity:    Worry: Not on file    Inability: Not on file  . Transportation needs:    Medical: Not on file    Non-medical: Not on file  Tobacco Use  . Smoking status: Never Smoker  . Smokeless tobacco: Current User    Types: Chew  Substance and Sexual Activity  . Alcohol use: No    Alcohol/week: 0.0 standard drinks  . Drug use: No  . Sexual activity: Not on file  Lifestyle  . Physical activity:    Days per week: Not on file    Minutes per session: Not on file  . Stress: Not on file  Relationships  . Social connections:    Talks on phone:  Not on file    Gets together: Not on file    Attends religious service: Not on file    Active member of club or organization: Not on file    Attends meetings of clubs or organizations: Not on file    Relationship status: Not on file  . Intimate partner violence:    Fear of current or ex partner: Not on file    Emotionally abused: Not on file    Physically abused: Not on file    Forced sexual activity: Not on file  Other Topics Concern  . Not on file  Social History Narrative   Married.   Works at numerous occupations.    Enjoys tending to his animals.     Past Surgical History:  Procedure Laterality Date  . BACK SURGERY    . CARPAL TUNNEL RELEASE    . KNEE SURGERY    . TOTAL KNEE ARTHROPLASTY      Family History  Problem Relation Age of Onset  . Hypertension Father   . Alzheimer's disease Father   . Diabetes Brother   . Hypertension Mother   . Breast cancer Mother   . Breast cancer Sister     Allergies  Allergen Reactions  .  Lisinopril Other (See Comments)    Myalgias, urinary frequency    Current Outpatient Medications on File Prior to Visit  Medication Sig Dispense Refill  . acetaminophen (TYLENOL) 500 MG tablet Take 500 mg by mouth every 6 (six) hours as needed.    Marland Kitchen amLODipine (NORVASC) 10 MG tablet Take 1 tablet (10 mg total) by mouth daily. 90 tablet 3  . atorvastatin (LIPITOR) 10 MG tablet Take 1 tablet (10 mg total) by mouth every evening. 90 tablet 3  . gabapentin (NEURONTIN) 100 MG capsule TAKE 1-3 CAPSULES BY MOUTH AT BEDTIME FOR FOOT PAIN. 270 capsule 1  . losartan (COZAAR) 50 MG tablet Take 1 tablet by mouth once daily for blood pressure. 30 tablet 0  . metFORMIN (GLUCOPHAGE XR) 500 MG 24 hr tablet Take 1 tablet (500 mg total) by mouth daily with breakfast. 90 tablet 3   No current facility-administered medications on file prior to visit.     BP (!) 146/76   Pulse 74   Temp 98.2 F (36.8 C) (Oral)   Ht 5\' 10"  (1.778 m)   Wt 272 lb 4 oz (123.5  kg)   SpO2 95%   BMI 39.06 kg/m    Objective:   Physical Exam  Constitutional: He appears well-nourished.  Neck: Neck supple.  Cardiovascular: Normal rate and regular rhythm.  Respiratory: Effort normal and breath sounds normal.  Skin: Skin is warm and dry.           Assessment & Plan:

## 2017-11-15 NOTE — Assessment & Plan Note (Signed)
Above goal in the office, home readings much better. Continue current regimen. Will call his pharmacy to verify the dose of losartan received as it's uncertain if he's taking 50 mg or 100 mg. BMP pending.

## 2017-11-15 NOTE — Telephone Encounter (Signed)
Spoken and notified patient of Kate Clark's comments. Patient verbalized understanding.  

## 2017-11-19 ENCOUNTER — Encounter: Payer: Self-pay | Admitting: *Deleted

## 2018-01-09 ENCOUNTER — Other Ambulatory Visit: Payer: Self-pay | Admitting: Primary Care

## 2018-01-09 DIAGNOSIS — E119 Type 2 diabetes mellitus without complications: Secondary | ICD-10-CM

## 2018-04-02 ENCOUNTER — Other Ambulatory Visit: Payer: Self-pay | Admitting: Primary Care

## 2018-04-02 DIAGNOSIS — I10 Essential (primary) hypertension: Secondary | ICD-10-CM

## 2018-04-11 ENCOUNTER — Other Ambulatory Visit: Payer: Self-pay | Admitting: Primary Care

## 2018-04-11 DIAGNOSIS — E119 Type 2 diabetes mellitus without complications: Secondary | ICD-10-CM

## 2018-04-11 DIAGNOSIS — E785 Hyperlipidemia, unspecified: Secondary | ICD-10-CM

## 2018-05-08 ENCOUNTER — Other Ambulatory Visit: Payer: Self-pay | Admitting: Primary Care

## 2018-05-08 DIAGNOSIS — I10 Essential (primary) hypertension: Secondary | ICD-10-CM

## 2018-05-23 ENCOUNTER — Ambulatory Visit: Payer: 59 | Admitting: Primary Care

## 2018-07-03 ENCOUNTER — Other Ambulatory Visit: Payer: Self-pay | Admitting: Primary Care

## 2018-07-03 DIAGNOSIS — E119 Type 2 diabetes mellitus without complications: Secondary | ICD-10-CM

## 2018-07-03 DIAGNOSIS — E785 Hyperlipidemia, unspecified: Secondary | ICD-10-CM

## 2018-07-17 ENCOUNTER — Other Ambulatory Visit: Payer: Self-pay | Admitting: Primary Care

## 2018-07-17 DIAGNOSIS — E119 Type 2 diabetes mellitus without complications: Secondary | ICD-10-CM

## 2018-08-02 ENCOUNTER — Ambulatory Visit: Payer: 59 | Admitting: Primary Care

## 2018-08-06 ENCOUNTER — Ambulatory Visit: Payer: 59 | Admitting: Primary Care

## 2018-08-06 ENCOUNTER — Encounter: Payer: Self-pay | Admitting: Primary Care

## 2018-08-06 ENCOUNTER — Other Ambulatory Visit: Payer: Self-pay

## 2018-08-06 VITALS — BP 142/74 | HR 75 | Temp 98.2°F | Ht 70.0 in | Wt 279.2 lb

## 2018-08-06 DIAGNOSIS — M2550 Pain in unspecified joint: Secondary | ICD-10-CM

## 2018-08-06 DIAGNOSIS — E119 Type 2 diabetes mellitus without complications: Secondary | ICD-10-CM

## 2018-08-06 DIAGNOSIS — G8929 Other chronic pain: Secondary | ICD-10-CM

## 2018-08-06 DIAGNOSIS — I10 Essential (primary) hypertension: Secondary | ICD-10-CM

## 2018-08-06 DIAGNOSIS — M069 Rheumatoid arthritis, unspecified: Secondary | ICD-10-CM | POA: Insufficient documentation

## 2018-08-06 DIAGNOSIS — E785 Hyperlipidemia, unspecified: Secondary | ICD-10-CM

## 2018-08-06 LAB — COMPREHENSIVE METABOLIC PANEL
ALT: 22 U/L (ref 0–53)
AST: 18 U/L (ref 0–37)
Albumin: 4.8 g/dL (ref 3.5–5.2)
Alkaline Phosphatase: 38 U/L — ABNORMAL LOW (ref 39–117)
BUN: 19 mg/dL (ref 6–23)
CO2: 31 mEq/L (ref 19–32)
Calcium: 10 mg/dL (ref 8.4–10.5)
Chloride: 99 mEq/L (ref 96–112)
Creatinine, Ser: 0.82 mg/dL (ref 0.40–1.50)
GFR: 95.89 mL/min (ref >60.00)
Glucose, Bld: 144 mg/dL — ABNORMAL HIGH (ref 70–99)
Potassium: 4.7 mEq/L (ref 3.5–5.1)
Sodium: 139 mEq/L (ref 135–145)
Total Bilirubin: 0.6 mg/dL (ref 0.2–1.2)
Total Protein: 8.3 g/dL (ref 6.0–8.3)

## 2018-08-06 LAB — SEDIMENTATION RATE: Sed Rate: 6 mm/hr (ref 0–20)

## 2018-08-06 LAB — URIC ACID: Uric Acid, Serum: 5.2 mg/dL (ref 4.0–7.8)

## 2018-08-06 LAB — LIPID PANEL
Cholesterol: 155 mg/dL (ref 0–200)
HDL: 48.2 mg/dL (ref >39.00)
LDL Cholesterol: 77 mg/dL (ref 0–99)
NonHDL: 107.05
Total CHOL/HDL Ratio: 3
Triglycerides: 149 mg/dL (ref 0.0–149.0)
VLDL: 29.8 mg/dL (ref 0.0–40.0)

## 2018-08-06 LAB — POCT GLYCOSYLATED HEMOGLOBIN (HGB A1C): Hemoglobin A1C: 6.7 % — AB (ref 4.0–5.6)

## 2018-08-06 MED ORDER — MELOXICAM 15 MG PO TABS: 15.0000 mg | ORAL_TABLET | Freq: Every day | ORAL | 0 refills | Status: DC

## 2018-08-06 MED ORDER — LOSARTAN POTASSIUM 100 MG PO TABS: 100.0000 mg | ORAL_TABLET | Freq: Every day | ORAL | 0 refills | Status: DC

## 2018-08-06 NOTE — Assessment & Plan Note (Signed)
To hands mostly, also to feet and knees. Check labs today including uric acid, RA labs. Advised he work on weight loss to reduce stress on the joints.   Rx for Meloxicam 15 mg sent to pharmacy, discussed to avoid NSAID's while taking. He will update.

## 2018-08-06 NOTE — Patient Instructions (Signed)
Stop by the lab prior to leaving today. I will notify you of your results once received.   You may take the Meloxicam 15 mg tablets once daily as needed for joint pain. Do not take Ibuprofen, Advil, Motrin, Aleve with this medication. You can take Tylenol.  We've increased your losartan to 100 mg. I sent a new prescription to your pharmacy.   Start exercising. You should be getting 150 minutes of moderate intensity exercise weekly.  It's important to improve your diet by reducing consumption of fast food, fried food, processed snack foods, sugary drinks. Increase consumption of fresh vegetables and fruits, whole grains, water.  Ensure you are drinking 64 ounces of water daily.  Please schedule a follow up appointment in 6 months.   It was a pleasure to see you today!   Diabetes Mellitus and Nutrition, Adult When you have diabetes (diabetes mellitus), it is very important to have healthy eating habits because your blood sugar (glucose) levels are greatly affected by what you eat and drink. Eating healthy foods in the appropriate amounts, at about the same times every day, can help you:  Control your blood glucose.  Lower your risk of heart disease.  Improve your blood pressure.  Reach or maintain a healthy weight. Every person with diabetes is different, and each person has different needs for a meal plan. Your health care provider may recommend that you work with a diet and nutrition specialist (dietitian) to make a meal plan that is best for you. Your meal plan may vary depending on factors such as:  The calories you need.  The medicines you take.  Your weight.  Your blood glucose, blood pressure, and cholesterol levels.  Your activity level.  Other health conditions you have, such as heart or kidney disease. How do carbohydrates affect me? Carbohydrates, also called carbs, affect your blood glucose level more than any other type of food. Eating carbs naturally raises the  amount of glucose in your blood. Carb counting is a method for keeping track of how many carbs you eat. Counting carbs is important to keep your blood glucose at a healthy level, especially if you use insulin or take certain oral diabetes medicines. It is important to know how many carbs you can safely have in each meal. This is different for every person. Your dietitian can help you calculate how many carbs you should have at each meal and for each snack. Foods that contain carbs include:  Bread, cereal, rice, pasta, and crackers.  Potatoes and corn.  Peas, beans, and lentils.  Milk and yogurt.  Fruit and juice.  Desserts, such as cakes, cookies, ice cream, and candy. How does alcohol affect me? Alcohol can cause a sudden decrease in blood glucose (hypoglycemia), especially if you use insulin or take certain oral diabetes medicines. Hypoglycemia can be a life-threatening condition. Symptoms of hypoglycemia (sleepiness, dizziness, and confusion) are similar to symptoms of having too much alcohol. If your health care provider says that alcohol is safe for you, follow these guidelines:  Limit alcohol intake to no more than 1 drink per day for nonpregnant women and 2 drinks per day for men. One drink equals 12 oz of beer, 5 oz of wine, or 1 oz of hard liquor.  Do not drink on an empty stomach.  Keep yourself hydrated with water, diet soda, or unsweetened iced tea.  Keep in mind that regular soda, juice, and other mixers may contain a lot of sugar and must be counted  as carbs. What are tips for following this plan?  Reading food labels  Start by checking the serving size on the "Nutrition Facts" label of packaged foods and drinks. The amount of calories, carbs, fats, and other nutrients listed on the label is based on one serving of the item. Many items contain more than one serving per package.  Check the total grams (g) of carbs in one serving. You can calculate the number of servings  of carbs in one serving by dividing the total carbs by 15. For example, if a food has 30 g of total carbs, it would be equal to 2 servings of carbs.  Check the number of grams (g) of saturated and trans fats in one serving. Choose foods that have low or no amount of these fats.  Check the number of milligrams (mg) of salt (sodium) in one serving. Most people should limit total sodium intake to less than 2,300 mg per day.  Always check the nutrition information of foods labeled as "low-fat" or "nonfat". These foods may be higher in added sugar or refined carbs and should be avoided.  Talk to your dietitian to identify your daily goals for nutrients listed on the label. Shopping  Avoid buying canned, premade, or processed foods. These foods tend to be high in fat, sodium, and added sugar.  Shop around the outside edge of the grocery store. This includes fresh fruits and vegetables, bulk grains, fresh meats, and fresh dairy. Cooking  Use low-heat cooking methods, such as baking, instead of high-heat cooking methods like deep frying.  Cook using healthy oils, such as olive, canola, or sunflower oil.  Avoid cooking with butter, cream, or high-fat meats. Meal planning  Eat meals and snacks regularly, preferably at the same times every day. Avoid going long periods of time without eating.  Eat foods high in fiber, such as fresh fruits, vegetables, beans, and whole grains. Talk to your dietitian about how many servings of carbs you can eat at each meal.  Eat 4-6 ounces (oz) of lean protein each day, such as lean meat, chicken, fish, eggs, or tofu. One oz of lean protein is equal to: ? 1 oz of meat, chicken, or fish. ? 1 egg. ?  cup of tofu.  Eat some foods each day that contain healthy fats, such as avocado, nuts, seeds, and fish. Lifestyle  Check your blood glucose regularly.  Exercise regularly as told by your health care provider. This may include: ? 150 minutes of  moderate-intensity or vigorous-intensity exercise each week. This could be brisk walking, biking, or water aerobics. ? Stretching and doing strength exercises, such as yoga or weightlifting, at least 2 times a week.  Take medicines as told by your health care provider.  Do not use any products that contain nicotine or tobacco, such as cigarettes and e-cigarettes. If you need help quitting, ask your health care provider.  Work with a Veterinary surgeon or diabetes educator to identify strategies to manage stress and any emotional and social challenges. Questions to ask a health care provider  Do I need to meet with a diabetes educator?  Do I need to meet with a dietitian?  What number can I call if I have questions?  When are the best times to check my blood glucose? Where to find more information:  American Diabetes Association: diabetes.org  Academy of Nutrition and Dietetics: www.eatright.[employer] of Diabetes and Digestive and Kidney Diseases (NIH): CarFlippers.tn Summary  A healthy meal plan will  help you control your blood glucose and maintain a healthy lifestyle.  Working with a diet and nutrition specialist (dietitian) can help you make a meal plan that is best for you.  Keep in mind that carbohydrates (carbs) and alcohol have immediate effects on your blood glucose levels. It is important to count carbs and to use alcohol carefully. This information is not intended to replace advice given to you by your health care provider. Make sure you discuss any questions you have with your health care provider. Document Released: 10/20/2004 Document Revised: 01/05/2017 Document Reviewed: 02/28/2016 Elsevier Patient Education  2020 [employer].

## 2018-08-06 NOTE — Assessment & Plan Note (Signed)
Above goal in the office today, did not return for follow up as recommended during his last visit. Increase losartan to 100 mg. Continue amlodipine 10 mg.  He will monitor BP at home and report readings at or above 135/90. BMP pending.

## 2018-08-06 NOTE — Assessment & Plan Note (Signed)
Overall good control on Metformin ER 500 mg but recent A1C has increased.   Strongly advised he work on diet and exercise.  Foot exam today.  Pneumonia vaccination UTD. Managed on ARB and statin.  Follow up in 6 months.

## 2018-08-06 NOTE — Progress Notes (Signed)
Subjective:    Patient ID: Seth Larsen, male    DOB: Jun 14, 1960, 58 y.o.   MRN: [id number]  HPI  Mr. Seth Larsen is a 58 year old male who presents today for follow up.  1) Type 2 Diabetes: Current medications include: Metformin ER 500 mg daily. He is still taking gabapentin 100 mg and taking 200 mg HS.    Last A1C: 6.7 today, 6.3 in March 2019 Last Eye Exam: Completed in February 2020 Last Foot Exam: Due Pneumonia Vaccination: Completed in 2017 ACE/ARB: Losartan Statin: atorvastatin   2) Essential Hypertension: Currently managed on Amlodipine 10 mg and losartan 25 mg. He does not check his BP often but does get 140-150's/70's.   BP Readings from Last 3 Encounters:  08/06/18 (!) 142/74  11/15/17 (!) 146/76  10/23/17 (!) 146/76   3) Hyperlipidemia: Currently managed on atorvastatin 10 mg. Last lipid panel March 2019 with LDl of 66.   4) Chronic Joint Pain: Located to the hands predominantly, history of carpal tunnel surgery to right wrist. He does have this to the left side as well but wasn't as bad. He's tried the braces and other treatments without improvement. Also with pain to his feet and ankles. He takes Ibuprofen 800 mg every morning without much improvement.   Review of Systems  Constitutional: Negative for fever.  Respiratory: Negative for shortness of breath.   Cardiovascular: Negative for chest pain.  Musculoskeletal: Positive for arthralgias.  Neurological: Positive for numbness.       Chronic numbness to hands bilaterally, also some to feet       Past Medical History:  Diagnosis Date  . Hypertension   . Type 2 diabetes mellitus ([place])      Social History   Socioeconomic History  . Marital status: Married    Spouse name: Not on file  . Number of children: Not on file  . Years of education: Not on file  . Highest education level: Not on file  Occupational History  . Not on file  Social Needs  . Financial resource strain: Not on file  . Food insecurity     Worry: Not on file    Inability: Not on file  . Transportation needs    Medical: Not on file    Non-medical: Not on file  Tobacco Use  . Smoking status: Never Smoker  . Smokeless tobacco: Current User    Types: Chew  Substance and Sexual Activity  . Alcohol use: No    Alcohol/week: 0.0 standard drinks  . Drug use: No  . Sexual activity: Not on file  Lifestyle  . Physical activity    Days per week: Not on file    Minutes per session: Not on file  . Stress: Not on file  Relationships  . Social Herbalist on phone: Not on file    Gets together: Not on file    Attends religious service: Not on file    Active member of club or organization: Not on file    Attends meetings of clubs or organizations: Not on file    Relationship status: Not on file  . Intimate partner violence    Fear of current or ex partner: Not on file    Emotionally abused: Not on file    Physically abused: Not on file    Forced sexual activity: Not on file  Other Topics Concern  . Not on file  Social History Narrative   Married.   Works  at numerous occupations.    Enjoys tending to his animals.     Past Surgical History:  Procedure Laterality Date  . BACK SURGERY    . CARPAL TUNNEL RELEASE    . KNEE SURGERY    . TOTAL KNEE ARTHROPLASTY      Family History  Problem Relation Age of Onset  . Hypertension Father   . Alzheimer's disease Father   . Diabetes Brother   . Hypertension Mother   . Breast cancer Mother   . Breast cancer Sister     Allergies  Allergen Reactions  . Lisinopril Other (See Comments)    Myalgias, urinary frequency    Current Outpatient Medications on File Prior to Visit  Medication Sig Dispense Refill  . acetaminophen (TYLENOL) 500 MG tablet Take 500 mg by mouth every 6 (six) hours as needed.    Marland Kitchen amLODipine (NORVASC) 10 MG tablet TAKE 1 TABLET BY MOUTH EVERY DAY 90 tablet 3  . atorvastatin (LIPITOR) 10 MG tablet TAKE 1 TABLET BY MOUTH EVERY DAY IN THE  EVENING 90 tablet 0  . gabapentin (NEURONTIN) 100 MG capsule TAKE 1-3 CAPSULES BY MOUTH AT BEDTIME FOR FOOT PAIN. 270 capsule 0  . metFORMIN (GLUCOPHAGE-XR) 500 MG 24 hr tablet TAKE 1 TABLET BY MOUTH EVERY DAY WITH BREAKFAST 90 tablet 0  . [DISCONTINUED] losartan (COZAAR) 25 MG tablet TAKE 2 TABLETS BY MOUTH EVERY DAY FOR BLOOD PRESSURE 180 tablet 1   No current facility-administered medications on file prior to visit.     BP (!) 142/74   Pulse 75   Temp 98.2 F (36.8 C) (Tympanic)   Ht 5\' 10"  (1.778 m)   Wt 279 lb 4 oz (126.7 kg)   SpO2 95%   BMI 40.07 kg/m    Objective:   Physical Exam  Constitutional: He appears well-nourished.  Neck: Neck supple.  Cardiovascular: Normal rate and regular rhythm.  Respiratory: Effort normal and breath sounds normal.  Musculoskeletal:     Comments: General decrease in ROM to digits of hands. Slight resting flexion of DIP joint to right third digit.   Skin: Skin is warm and dry.  Psychiatric: He has a normal mood and affect.           Assessment & Plan:

## 2018-08-07 LAB — CYCLIC CITRUL PEPTIDE ANTIBODY, IGG: Cyclic Citrullin Peptide Ab: 119 UNITS — ABNORMAL HIGH

## 2018-08-07 LAB — RHEUMATOID FACTOR: Rheumatoid fact SerPl-aCnc: <14 IU/mL (ref <14)

## 2018-08-08 ENCOUNTER — Other Ambulatory Visit: Payer: Self-pay | Admitting: Primary Care

## 2018-08-08 DIAGNOSIS — G8929 Other chronic pain: Secondary | ICD-10-CM

## 2018-08-15 ENCOUNTER — Telehealth: Payer: Self-pay | Admitting: Primary Care

## 2018-08-15 NOTE — Telephone Encounter (Signed)
Pt thought he was to try meds first and then would talk about going to see rheumatologist.  He would like to discuss labs w/you.

## 2018-08-15 NOTE — Telephone Encounter (Signed)
Given his abnormal lab test I recommend he see rheumatology.  If he prefers to wait that's fine but his lab test came up suspicious for rheumatoid arthritis.

## 2018-08-16 NOTE — Telephone Encounter (Signed)
Discussed w/pt. He agrees to see rheumatologist

## 2018-09-22 ENCOUNTER — Other Ambulatory Visit: Payer: Self-pay | Admitting: Primary Care

## 2018-09-24 ENCOUNTER — Other Ambulatory Visit: Payer: Self-pay | Admitting: Primary Care

## 2018-09-24 DIAGNOSIS — E785 Hyperlipidemia, unspecified: Secondary | ICD-10-CM

## 2018-09-24 DIAGNOSIS — E119 Type 2 diabetes mellitus without complications: Secondary | ICD-10-CM

## 2018-10-01 ENCOUNTER — Other Ambulatory Visit: Payer: Self-pay | Admitting: Primary Care

## 2018-10-01 DIAGNOSIS — I10 Essential (primary) hypertension: Secondary | ICD-10-CM

## 2018-10-01 DIAGNOSIS — E119 Type 2 diabetes mellitus without complications: Secondary | ICD-10-CM

## 2018-10-01 DIAGNOSIS — G8929 Other chronic pain: Secondary | ICD-10-CM

## 2018-10-10 ENCOUNTER — Telehealth: Payer: Self-pay

## 2018-10-10 NOTE — Telephone Encounter (Signed)
Has he ever taken regular Metformin? If not, send in Metformin 500 mg BID #60, 1 refill. Have him follow up with Anda Kraft at the end of the month for repeat A1C.

## 2018-10-10 NOTE — Telephone Encounter (Signed)
Message left for patient to return my call.  

## 2018-10-10 NOTE — Telephone Encounter (Signed)
Pt received letter from [facility] that metformin XR 500 mg taking one tab daily has been recalled. Pt wants to know if needs to change medication. Pt request [facility] [place]. Pt last seen 08/06/18 for FU.

## 2018-10-11 MED ORDER — METFORMIN HCL 500 MG PO TABS: 500.0000 mg | ORAL_TABLET | Freq: Two times a day (BID) | ORAL | 1 refills | Status: DC

## 2018-10-11 NOTE — Telephone Encounter (Signed)
Message left for patient to return my call.  

## 2018-10-11 NOTE — Telephone Encounter (Signed)
Spoken to patient and he have not tried the regular Metformin and is agreeable to the change.  Sent Rx to [facility] and follow up on 11/08/2018

## 2018-10-11 NOTE — Addendum Note (Signed)
Addended by: Jacqualin Combes on: 10/11/2018 08:29 AM   Modules accepted: Orders

## 2018-10-18 ENCOUNTER — Ambulatory Visit (INDEPENDENT_AMBULATORY_CARE_PROVIDER_SITE_OTHER): Payer: No Typology Code available for payment source

## 2018-10-18 ENCOUNTER — Other Ambulatory Visit: Payer: Self-pay

## 2018-10-18 DIAGNOSIS — Z23 Encounter for immunization: Secondary | ICD-10-CM | POA: Diagnosis not present

## 2018-11-04 ENCOUNTER — Other Ambulatory Visit: Payer: Self-pay | Admitting: Primary Care

## 2018-11-08 ENCOUNTER — Ambulatory Visit: Payer: 59 | Admitting: Primary Care

## 2018-11-11 ENCOUNTER — Ambulatory Visit: Payer: No Typology Code available for payment source | Admitting: Primary Care

## 2018-11-11 ENCOUNTER — Other Ambulatory Visit: Payer: Self-pay

## 2018-11-11 ENCOUNTER — Encounter: Payer: Self-pay | Admitting: Primary Care

## 2018-11-11 VITALS — BP 130/70 | HR 61 | Temp 97.8°F | Ht 70.0 in | Wt 278.0 lb

## 2018-11-11 DIAGNOSIS — I10 Essential (primary) hypertension: Secondary | ICD-10-CM | POA: Diagnosis not present

## 2018-11-11 DIAGNOSIS — M2550 Pain in unspecified joint: Secondary | ICD-10-CM | POA: Diagnosis not present

## 2018-11-11 DIAGNOSIS — E119 Type 2 diabetes mellitus without complications: Secondary | ICD-10-CM

## 2018-11-11 DIAGNOSIS — Z23 Encounter for immunization: Secondary | ICD-10-CM | POA: Diagnosis not present

## 2018-11-11 DIAGNOSIS — G8929 Other chronic pain: Secondary | ICD-10-CM

## 2018-11-11 DIAGNOSIS — M069 Rheumatoid arthritis, unspecified: Secondary | ICD-10-CM

## 2018-11-11 LAB — POCT GLYCOSYLATED HEMOGLOBIN (HGB A1C): Hemoglobin A1C: 6.7 % — AB (ref 4.0–5.6)

## 2018-11-11 MED ORDER — MELOXICAM 15 MG PO TABS: 15.0000 mg | ORAL_TABLET | Freq: Every day | ORAL | 0 refills | Status: DC

## 2018-11-11 NOTE — Addendum Note (Signed)
Addended by: Jacqualin Combes on: 11/11/2018 08:57 AM   Modules accepted: Orders

## 2018-11-11 NOTE — Assessment & Plan Note (Signed)
Diagnosed in Summer 2020, now following with Rheumatology. Continue current regimen.

## 2018-11-11 NOTE — Assessment & Plan Note (Addendum)
Improved in the office today, continue Losartan and Amlodipine.

## 2018-11-11 NOTE — Patient Instructions (Signed)
Continue Metformin twice daily for diabetes.  Start exercising. You should be getting 150 minutes of moderate intensity exercise weekly.  Continue to work on a healthy diet by limiting portion sizes.  Please schedule a physical with me in 6 months You may also schedule a lab only appointment 3-4 days prior. We will discuss your lab results in detail during your physical.  You are due for your next Shingrix 2-6 months from today.  It was a pleasure to see you today!

## 2018-11-11 NOTE — Assessment & Plan Note (Signed)
A1C today of 6.7 which is under good control. Continue metformin BID.  Foot and eye exams UTD. Pneumonia vaccination UTD. Managed on statin and ARB.  Follow up in 6 months.

## 2018-11-11 NOTE — Progress Notes (Signed)
Subjective:    Patient ID: Seth Larsen, male    DOB: 09-17-1960, 58 y.o.   MRN: [id number]  HPI  Seth Larsen is a 58 year old male who presents today for follow up of diabetes.  Current medications include: Metformin 500 mg BID.  He is checking his blood glucose 0 times daily.  Last A1C: 6.7 in June 2020 Last Eye Exam: Due in February 2021 Last Foot Exam: Completed in June 2020 Pneumonia Vaccination: Completed in 2017 ACE/ARB: Losartan Statin: Lipitor  BP Readings from Last 3 Encounters:  11/11/18 130/70  08/06/18 (!) 142/74  11/15/17 (!) 146/76   He has recently began working on his diet by limiting portion sizes, more home cooked meals. He is not exercising.   Review of Systems  Respiratory: Negative for shortness of breath.   Cardiovascular: Negative for chest pain.  Neurological: Negative for dizziness and numbness.       Past Medical History:  Diagnosis Date  . Hypertension   . Type 2 diabetes mellitus (HCC)      Social History   Socioeconomic History  . Marital status: Married    Spouse name: Not on file  . Number of children: Not on file  . Years of education: Not on file  . Highest education level: Not on file  Occupational History  . Not on file  Social Needs  . Financial resource strain: Not on file  . Food insecurity    Worry: Not on file    Inability: Not on file  . Transportation needs    Medical: Not on file    Non-medical: Not on file  Tobacco Use  . Smoking status: Never Smoker  . Smokeless tobacco: Current User    Types: Chew  Substance and Sexual Activity  . Alcohol use: No    Alcohol/week: 0.0 standard drinks  . Drug use: No  . Sexual activity: Not on file  Lifestyle  . Physical activity    Days per week: Not on file    Minutes per session: Not on file  . Stress: Not on file  Relationships  . Social Musician on phone: Not on file    Gets together: Not on file    Attends religious service: Not on file   Active member of club or organization: Not on file    Attends meetings of clubs or organizations: Not on file    Relationship status: Not on file  . Intimate partner violence    Fear of current or ex partner: Not on file    Emotionally abused: Not on file    Physically abused: Not on file    Forced sexual activity: Not on file  Other Topics Concern  . Not on file  Social History Narrative   Married.   Works at numerous occupations.    Enjoys tending to his animals.     Past Surgical History:  Procedure Laterality Date  . BACK SURGERY    . CARPAL TUNNEL RELEASE    . KNEE SURGERY    . TOTAL KNEE ARTHROPLASTY      Family History  Problem Relation Age of Onset  . Hypertension Father   . Alzheimer's disease Father   . Diabetes Brother   . Hypertension Mother   . Breast cancer Mother   . Breast cancer Sister     Allergies  Allergen Reactions  . Lisinopril Other (See Comments)    Myalgias, urinary frequency    Current Outpatient  Medications on File Prior to Visit  Medication Sig Dispense Refill  . acetaminophen (TYLENOL) 500 MG tablet Take 500 mg by mouth every 6 (six) hours as needed.    Marland Kitchen amLODipine (NORVASC) 10 MG tablet TAKE 1 TABLET BY MOUTH EVERY DAY 90 tablet 3  . atorvastatin (LIPITOR) 10 MG tablet TAKE 1 TABLET BY MOUTH EVERY DAY IN THE EVENING 90 tablet 1  . gabapentin (NEURONTIN) 100 MG capsule TAKE 1-3 CAPSULES BY MOUTH AT BEDTIME FOR FOOT PAIN. 270 capsule 0  . losartan (COZAAR) 100 MG tablet Take 1 tablet (100 mg total) by mouth daily. For blood pressure. 90 tablet 0  . meloxicam (MOBIC) 15 MG tablet TAKE 1 TABLET (15 MG TOTAL) BY MOUTH DAILY. AS NEEDED FOR JOINT PAIN. 90 tablet 0  . metFORMIN (GLUCOPHAGE) 500 MG tablet TAKE 1 TABLET (500 MG TOTAL) BY MOUTH 2 (TWO) TIMES DAILY WITH A MEAL. 60 tablet 0  . methotrexate (50 MG/ML) 1 g injection Inject 8 mg into the vein every 7 (seven) days.     No current facility-administered medications on file prior to  visit.     BP 130/70   Pulse 61   Temp 97.8 F (36.6 C) (Temporal)   Ht 5\' 10"  (1.778 m)   Wt 278 lb (126.1 kg)   SpO2 95%   BMI 39.89 kg/m    Objective:   Physical Exam  Constitutional: He appears well-nourished.  Neck: Neck supple.  Cardiovascular: Normal rate and regular rhythm.  Respiratory: Effort normal and breath sounds normal.  Skin: Skin is warm and dry.  Psychiatric: He has a normal mood and affect.           Assessment & Plan:

## 2018-11-11 NOTE — Addendum Note (Signed)
Addended by: Pleas Koch on: 11/11/2018 07:56 AM   Modules accepted: Orders

## 2018-11-15 ENCOUNTER — Other Ambulatory Visit: Payer: Self-pay | Admitting: Primary Care

## 2019-01-14 ENCOUNTER — Other Ambulatory Visit: Payer: Self-pay | Admitting: Primary Care

## 2019-01-14 DIAGNOSIS — I10 Essential (primary) hypertension: Secondary | ICD-10-CM

## 2019-02-13 ENCOUNTER — Ambulatory Visit: Payer: 59 | Admitting: Primary Care

## 2019-04-02 ENCOUNTER — Telehealth: Payer: Self-pay | Admitting: Primary Care

## 2019-04-02 MED ORDER — METFORMIN HCL 500 MG PO TABS: 500.0000 mg | ORAL_TABLET | Freq: Two times a day (BID) | ORAL | 1 refills | Status: DC

## 2019-04-02 NOTE — Telephone Encounter (Signed)
Pt called back stating [facility] to [facility] for duloxetine   Cymbalta Approval for 90 day supply  Pt is out of meds Please advise when called

## 2019-04-02 NOTE — Telephone Encounter (Signed)
Pt calling needing a refill metformin  Pt stated he is taking 250mg  twice daily  [facility] Pt [facility] 4 pills left

## 2019-04-02 NOTE — Telephone Encounter (Signed)
Message left for patient to return my call.  Cymbalta is not on current medication here. Need to know who prescribed it before.  Metformin sent as requested.

## 2019-04-03 NOTE — Telephone Encounter (Signed)
Patient called back and asked regarding Cymbalta. Patient is not sure of the dosage and unable to look up due to at work.  So [facility] and was notified it was Cymbalta 60 mg. I was also told patient had a refill ready from his rheumatologist.   I have notified patient that [facility]

## 2019-04-06 ENCOUNTER — Other Ambulatory Visit: Payer: Self-pay | Admitting: Primary Care

## 2019-04-06 DIAGNOSIS — I10 Essential (primary) hypertension: Secondary | ICD-10-CM

## 2019-04-24 ENCOUNTER — Other Ambulatory Visit: Payer: Self-pay | Admitting: Primary Care

## 2019-04-24 DIAGNOSIS — Z125 Encounter for screening for malignant neoplasm of prostate: Secondary | ICD-10-CM

## 2019-04-24 DIAGNOSIS — I10 Essential (primary) hypertension: Secondary | ICD-10-CM

## 2019-04-24 DIAGNOSIS — E785 Hyperlipidemia, unspecified: Secondary | ICD-10-CM

## 2019-04-24 DIAGNOSIS — E119 Type 2 diabetes mellitus without complications: Secondary | ICD-10-CM

## 2019-05-05 ENCOUNTER — Other Ambulatory Visit: Payer: Self-pay

## 2019-05-05 ENCOUNTER — Other Ambulatory Visit (INDEPENDENT_AMBULATORY_CARE_PROVIDER_SITE_OTHER): Payer: No Typology Code available for payment source

## 2019-05-05 DIAGNOSIS — Z125 Encounter for screening for malignant neoplasm of prostate: Secondary | ICD-10-CM

## 2019-05-05 DIAGNOSIS — E785 Hyperlipidemia, unspecified: Secondary | ICD-10-CM

## 2019-05-05 DIAGNOSIS — I10 Essential (primary) hypertension: Secondary | ICD-10-CM

## 2019-05-05 DIAGNOSIS — E119 Type 2 diabetes mellitus without complications: Secondary | ICD-10-CM

## 2019-05-05 LAB — CBC
HCT: 44.6 % (ref 39.0–52.0)
Hemoglobin: 15.1 g/dL (ref 13.0–17.0)
MCHC: 33.9 g/dL (ref 30.0–36.0)
MCV: 89.9 fl (ref 78.0–100.0)
Platelets: 229 10*3/uL (ref 150.0–400.0)
RBC: 4.97 Mil/uL (ref 4.22–5.81)
RDW: 14.2 % (ref 11.5–15.5)
WBC: 10 10*3/uL (ref 4.0–10.5)

## 2019-05-05 LAB — COMPREHENSIVE METABOLIC PANEL
ALT: 24 U/L (ref 0–53)
AST: 17 U/L (ref 0–37)
Albumin: 4.3 g/dL (ref 3.5–5.2)
Alkaline Phosphatase: 32 U/L — ABNORMAL LOW (ref 39–117)
BUN: 18 mg/dL (ref 6–23)
CO2: 31 mEq/L (ref 19–32)
Calcium: 9.8 mg/dL (ref 8.4–10.5)
Chloride: 100 mEq/L (ref 96–112)
Creatinine, Ser: 0.79 mg/dL (ref 0.40–1.50)
GFR: 99.85 mL/min (ref >60.00)
Glucose, Bld: 100 mg/dL — ABNORMAL HIGH (ref 70–99)
Potassium: 4.3 mEq/L (ref 3.5–5.1)
Sodium: 137 mEq/L (ref 135–145)
Total Bilirubin: 0.6 mg/dL (ref 0.2–1.2)
Total Protein: 6.8 g/dL (ref 6.0–8.3)

## 2019-05-05 LAB — LIPID PANEL
Cholesterol: 138 mg/dL (ref 0–200)
HDL: 41.8 mg/dL (ref >39.00)
LDL Cholesterol: 69 mg/dL (ref 0–99)
NonHDL: 96.17
Total CHOL/HDL Ratio: 3
Triglycerides: 134 mg/dL (ref 0.0–149.0)
VLDL: 26.8 mg/dL (ref 0.0–40.0)

## 2019-05-05 LAB — PSA: PSA: 0.73 ng/mL (ref 0.10–4.00)

## 2019-05-05 LAB — HEMOGLOBIN A1C: Hgb A1c MFr Bld: 6.5 % (ref 4.6–6.5)

## 2019-05-09 ENCOUNTER — Other Ambulatory Visit: Payer: No Typology Code available for payment source

## 2019-05-12 ENCOUNTER — Telehealth: Payer: Self-pay

## 2019-05-12 ENCOUNTER — Encounter: Payer: No Typology Code available for payment source | Admitting: Primary Care

## 2019-05-12 NOTE — Telephone Encounter (Signed)
Pt left v/m that pt has in office CPX appt on 05/14/19 at 7:40 and pt had first shingles vaccine on 11/11/18 and pt wants to make sure he gets the second shingles vaccine on 05/14/19 while pt is here for CPX. FYI to Allayne Gitelman NP and Johny Drilling CMA.

## 2019-05-13 NOTE — Telephone Encounter (Signed)
Yes, we can provide. FYI to [place].

## 2019-05-14 ENCOUNTER — Ambulatory Visit (INDEPENDENT_AMBULATORY_CARE_PROVIDER_SITE_OTHER): Payer: No Typology Code available for payment source | Admitting: Primary Care

## 2019-05-14 ENCOUNTER — Encounter: Payer: Self-pay | Admitting: Primary Care

## 2019-05-14 ENCOUNTER — Other Ambulatory Visit: Payer: Self-pay

## 2019-05-14 VITALS — BP 124/82 | HR 76 | Temp 96.9°F | Ht 70.0 in | Wt 269.5 lb

## 2019-05-14 DIAGNOSIS — Z23 Encounter for immunization: Secondary | ICD-10-CM

## 2019-05-14 DIAGNOSIS — Z0000 Encounter for general adult medical examination without abnormal findings: Secondary | ICD-10-CM

## 2019-05-14 DIAGNOSIS — I10 Essential (primary) hypertension: Secondary | ICD-10-CM | POA: Diagnosis not present

## 2019-05-14 DIAGNOSIS — G4733 Obstructive sleep apnea (adult) (pediatric): Secondary | ICD-10-CM

## 2019-05-14 DIAGNOSIS — M549 Dorsalgia, unspecified: Secondary | ICD-10-CM

## 2019-05-14 DIAGNOSIS — M2550 Pain in unspecified joint: Secondary | ICD-10-CM

## 2019-05-14 DIAGNOSIS — G8929 Other chronic pain: Secondary | ICD-10-CM

## 2019-05-14 DIAGNOSIS — E785 Hyperlipidemia, unspecified: Secondary | ICD-10-CM

## 2019-05-14 DIAGNOSIS — M069 Rheumatoid arthritis, unspecified: Secondary | ICD-10-CM

## 2019-05-14 DIAGNOSIS — Z1211 Encounter for screening for malignant neoplasm of colon: Secondary | ICD-10-CM

## 2019-05-14 DIAGNOSIS — E119 Type 2 diabetes mellitus without complications: Secondary | ICD-10-CM | POA: Diagnosis not present

## 2019-05-14 DIAGNOSIS — R351 Nocturia: Secondary | ICD-10-CM

## 2019-05-14 MED ORDER — TAMSULOSIN HCL 0.4 MG PO CAPS: 0.4000 mg | ORAL_CAPSULE | Freq: Every evening | ORAL | 0 refills | Status: DC

## 2019-05-14 NOTE — Assessment & Plan Note (Addendum)
Following with rheumatology, doing well on Humira and Methotrexate. Also managed on gabapentin.   Continue same.

## 2019-05-14 NOTE — Patient Instructions (Signed)
Start tamsulosin 0.4 mg tablets for urinary frequency at night. Take this in the evening. Please update me as discussed.  Start exercising. You should be getting 150 minutes of exercise weekly.  It's important to improve your diet by reducing consumption of fast food, fried food, processed snack foods, sugary drinks. Increase consumption of fresh vegetables and fruits, whole grains, water.  Ensure you are drinking 64 ounces of water daily.  Please schedule a follow up appointment in 6 months for diabetes check.   It was a pleasure to see you today!   Preventive Care 37-66 Years Old, Male Preventive care refers to lifestyle choices and visits with your health care provider that can promote health and wellness. This includes:  A yearly physical exam. This is also called an annual well check.  Regular dental and eye exams.  Immunizations.  Screening for certain conditions.  Healthy lifestyle choices, such as eating a healthy diet, getting regular exercise, not using drugs or products that contain nicotine and tobacco, and limiting alcohol use. What can I expect for my preventive care visit? Physical exam Your health care provider will check:  Height and weight. These may be used to calculate body mass index (BMI), which is a measurement that tells if you are at a healthy weight.  Heart rate and blood pressure.  Your skin for abnormal spots. Counseling Your health care provider may ask you questions about:  Alcohol, tobacco, and drug use.  Emotional well-being.  Home and relationship well-being.  Sexual activity.  Eating habits.  Work and work Statistician. What immunizations do I need?  Influenza (flu) vaccine  This is recommended every year. Tetanus, diphtheria, and pertussis (Tdap) vaccine  You may need a Td booster every 10 years. Varicella (chickenpox) vaccine  You may need this vaccine if you have not already been vaccinated. Zoster (shingles)  vaccine  You may need this after age 31. Measles, mumps, and rubella (MMR) vaccine  You may need at least one dose of MMR if you were born in 1957 or later. You may also need a second dose. Pneumococcal conjugate (PCV13) vaccine  You may need this if you have certain conditions and were not previously vaccinated. Pneumococcal polysaccharide (PPSV23) vaccine  You may need one or two doses if you smoke cigarettes or if you have certain conditions. Meningococcal conjugate (MenACWY) vaccine  You may need this if you have certain conditions. Hepatitis A vaccine  You may need this if you have certain conditions or if you travel or work in places where you may be exposed to hepatitis A. Hepatitis B vaccine  You may need this if you have certain conditions or if you travel or work in places where you may be exposed to hepatitis B. Haemophilus influenzae type b (Hib) vaccine  You may need this if you have certain risk factors. Human papillomavirus (HPV) vaccine  If recommended by your health care provider, you may need three doses over 6 months. You may receive vaccines as individual doses or as more than one vaccine together in one shot (combination vaccines). Talk with your health care provider about the risks and benefits of combination vaccines. What tests do I need? Blood tests  Lipid and cholesterol levels. These may be checked every 5 years, or more frequently if you are over 15 years old.  Hepatitis C test.  Hepatitis B test. Screening  Lung cancer screening. You may have this screening every year starting at age 46 if you have a 30-pack-year history  of smoking and currently smoke or have quit within the past 15 years.  Prostate cancer screening. Recommendations will vary depending on your family history and other risks.  Colorectal cancer screening. All adults should have this screening starting at age 61 and continuing until age 84. Your health care provider may recommend  screening at age 23 if you are at increased risk. You will have tests every 1-10 years, depending on your results and the type of screening test.  Diabetes screening. This is done by checking your blood sugar (glucose) after you have not eaten for a while (fasting). You may have this done every 1-3 years.  Sexually transmitted disease (STD) testing. Follow these instructions at home: Eating and drinking  Eat a diet that includes fresh fruits and vegetables, whole grains, lean protein, and low-fat dairy products.  Take vitamin and mineral supplements as recommended by your health care provider.  Do not drink alcohol if your health care provider tells you not to drink.  If you drink alcohol: ? Limit how much you have to 0-2 drinks a day. ? Be aware of how much alcohol is in your drink. In the [place] one drink equals one 12 oz bottle of beer (355 mL), one 5 oz glass of wine (148 mL), or one 1 oz glass of hard liquor (44 mL). Lifestyle  Take daily care of your teeth and gums.  Stay active. Exercise for at least 30 minutes on 5 or more days each week.  Do not use any products that contain nicotine or tobacco, such as cigarettes, e-cigarettes, and chewing tobacco. If you need help quitting, ask your health care provider.  If you are sexually active, practice safe sex. Use a condom or other form of protection to prevent STIs (sexually transmitted infections).  Talk with your health care provider about taking a low-dose aspirin every day starting at age 22. What's next?  Go to your health care provider once a year for a well check visit.  Ask your health care provider how often you should have your eyes and teeth checked.  Stay up to date on all vaccines. This information is not intended to replace advice given to you by your health care provider. Make sure you discuss any questions you have with your health care provider. Document Revised: 01/17/2018 Document Reviewed:  01/17/2018 Elsevier Patient Education  2020 [employer].

## 2019-05-14 NOTE — Assessment & Plan Note (Addendum)
Not using CPAP, doesn't feel that it ever helped due to his 4 hour sleep schedule. He is waking during the night to urinate often. Discussed importance of CPAP use, he verbalized understanding.

## 2019-05-14 NOTE — Addendum Note (Signed)
Addended by: Tawnya Crook on: 05/14/2019 12:43 PM   Modules accepted: Orders

## 2019-05-14 NOTE — Assessment & Plan Note (Signed)
A1C of 6.5 on recent labs, compliant to Metformin BID. Managed on ARB and statin. Foot exam UTD.  Eye exam due, he will schedule. Pneumonia vaccination UTD.   Follow up in 6 months.

## 2019-05-14 NOTE — Assessment & Plan Note (Addendum)
Increased frequency during the day, also at night and is waking 2-4 times within four hours. Little caffeine consumption, little water intake before bed.   Discussed options for treatment, trial of Flomax sent to pharmacy, he will update.

## 2019-05-14 NOTE — Progress Notes (Signed)
Subjective:    Patient ID: Seth Larsen, male    DOB: 21-Mar-1960, 59 y.o.   MRN: [id number]  HPI  This visit occurred during the SARS-CoV-2 public health emergency.  Safety protocols were in place, including screening questions prior to the visit, additional usage of staff PPE, and extensive cleaning of exam room while observing appropriate contact time as indicated for disinfecting solutions.   Seth Larsen is a 59 year old male who presents today for complete physical.  Immunizations: -Tetanus: Completed in 2017 -Influenza: Completed last season  -Shingles: Due for second dose today -Pneumonia: Completed last in 2017  Diet: He endorses a fair diet. Exercise: He is not exercising, active at home.  Eye exam: Completed in 2020 Dental exam: Completes semi-annually   Colonoscopy: Completed Cologuard in 2017, opts for Cologuard.  PSA: 0.73 in 2021 Hep C Screen: Due  BP Readings from Last 3 Encounters:  05/14/19 124/82  11/11/18 130/70  08/06/18 (!) 142/74      Review of Systems  Constitutional: Negative for unexpected weight change.  HENT: Negative for rhinorrhea.   Respiratory: Negative for cough and shortness of breath.   Cardiovascular: Negative for chest pain.  Gastrointestinal: Negative for constipation and diarrhea.  Genitourinary:       Nocturia   Musculoskeletal: Positive for arthralgias.  Skin: Negative for rash.  Allergic/Immunologic: Negative for environmental allergies.  Neurological: Positive for numbness. Negative for dizziness and headaches.  Psychiatric/Behavioral: The patient is not nervous/anxious.        Past Medical History:  Diagnosis Date  . Hypertension   . Type 2 diabetes mellitus (HCC)      Social History   Socioeconomic History  . Marital status: Married    Spouse name: Not on file  . Number of children: Not on file  . Years of education: Not on file  . Highest education level: Not on file  Occupational History  . Not on file    Tobacco Use  . Smoking status: Never Smoker  . Smokeless tobacco: Current User    Types: Chew  Substance and Sexual Activity  . Alcohol use: No    Alcohol/week: 0.0 standard drinks  . Drug use: No  . Sexual activity: Not on file  Other Topics Concern  . Not on file  Social History Narrative   Married.   Works at numerous occupations.    Enjoys tending to his animals.    Social Determinants of Health   Financial Resource Strain:   . Difficulty of Paying Living Expenses:   Food Insecurity:   . Worried About Programme researcher, broadcasting/film/video in the Last Year:   . Barista in the Last Year:   Transportation Needs:   . Freight forwarder (Medical):   Marland Kitchen Lack of Transportation (Non-Medical):   Physical Activity:   . Days of Exercise per Week:   . Minutes of Exercise per Session:   Stress:   . Feeling of Stress :   Social Connections:   . Frequency of Communication with Friends and Family:   . Frequency of Social Gatherings with Friends and Family:   . Attends Religious Services:   . Active Member of Clubs or Organizations:   . Attends Banker Meetings:   Marland Kitchen Marital Status:   Intimate Partner Violence:   . Fear of Current or Ex-Partner:   . Emotionally Abused:   Marland Kitchen Physically Abused:   . Sexually Abused:     Past Surgical History:  Procedure Laterality Date  . BACK SURGERY    . CARPAL TUNNEL RELEASE    . KNEE SURGERY    . TOTAL KNEE ARTHROPLASTY      Family History  Problem Relation Age of Onset  . Hypertension Father   . Alzheimer's disease Father   . Diabetes Brother   . Hypertension Mother   . Breast cancer Mother   . Breast cancer Sister     Allergies  Allergen Reactions  . Lisinopril Other (See Comments)    Myalgias, urinary frequency    Current Outpatient Medications on File Prior to Visit  Medication Sig Dispense Refill  . acetaminophen (TYLENOL) 500 MG tablet Take 500 mg by mouth every 6 (six) hours as needed.    Marland Kitchen amLODipine (NORVASC)  10 MG tablet TAKE 1 TABLET BY MOUTH EVERY DAY 90 tablet 1  . atorvastatin (LIPITOR) 10 MG tablet TAKE 1 TABLET BY MOUTH EVERY DAY IN THE EVENING 90 tablet 1  . B-D TB SYRINGE 1CC/27GX1/2" 27G X 1/2" 1 ML MISC USE FOR WEEKLY METHOTREXATE SUBCUTANEOUSLY    . DULoxetine (CYMBALTA) 60 MG capsule Take 60 mg by mouth daily.    . folic acid (FOLVITE) 1 MG tablet Take 1 mg by mouth daily.    Marland Kitchen gabapentin (NEURONTIN) 100 MG capsule TAKE 1-3 CAPSULES BY MOUTH AT BEDTIME FOR FOOT PAIN. 270 capsule 0  . HUMIRA PEN 40 MG/0.4ML PNKT Inject 40 mLs as directed every 7 (seven) weeks.     Marland Kitchen losartan (COZAAR) 100 MG tablet TAKE 1 TABLET BY MOUTH EVERY DAY FOR BLOOD PRESSURE 90 tablet 1  . metFORMIN (GLUCOPHAGE) 500 MG tablet Take 1 tablet (500 mg total) by mouth 2 (two) times daily with a meal. 180 tablet 1  . methotrexate (50 MG/ML) 1 g injection Inject 8 mg into the vein every 7 (seven) days.     No current facility-administered medications on file prior to visit.    BP 124/82   Pulse 76   Temp (!) 96.9 F (36.1 C) (Temporal)   Ht 5\' 10"  (1.778 m)   Wt 269 lb 8 oz (122.2 kg)   SpO2 95%   BMI 38.67 kg/m    Objective:   Physical Exam  Constitutional: He is oriented to person, place, and time. He appears well-nourished.  HENT:  Right Ear: Tympanic membrane and ear canal normal.  Left Ear: Tympanic membrane and ear canal normal.  Mouth/Throat: Oropharynx is clear and moist.  Eyes: Pupils are equal, round, and reactive to light. EOM are normal.  Cardiovascular: Normal rate and regular rhythm.  Respiratory: Effort normal and breath sounds normal.  GI: Soft. Bowel sounds are normal. There is no abdominal tenderness.  Musculoskeletal:        General: Normal range of motion.     Cervical back: Neck supple.  Neurological: He is alert and oriented to person, place, and time. No cranial nerve deficit.  Reflex Scores:      Patellar reflexes are 2+ on the right side and 2+ on the left side. Skin: Skin is  warm and dry.  Psychiatric: He has a normal mood and affect.           Assessment & Plan:

## 2019-05-14 NOTE — Assessment & Plan Note (Signed)
Improved overall on RA regimen. Continue to monitor.

## 2019-05-14 NOTE — Assessment & Plan Note (Signed)
LDL at goal on recent labs, continue atorvastatin.

## 2019-05-14 NOTE — Assessment & Plan Note (Signed)
Immunizations UTD except for Shingrix, provided last vaccine today. PSA UTD. Colon cancer screening due, prefers Cologuard which was ordered. Encouraged a healthy diet, regular exercise. Exam today stable. Labs reviewed.

## 2019-05-14 NOTE — Assessment & Plan Note (Signed)
Stable in the office today, continue amlodipine 10 mg and losartan 100 mg. CMP reviewed.

## 2019-06-05 ENCOUNTER — Other Ambulatory Visit: Payer: Self-pay | Admitting: Primary Care

## 2019-06-05 DIAGNOSIS — R351 Nocturia: Secondary | ICD-10-CM

## 2019-06-08 ENCOUNTER — Other Ambulatory Visit: Payer: Self-pay | Admitting: Primary Care

## 2019-06-08 DIAGNOSIS — G8929 Other chronic pain: Secondary | ICD-10-CM

## 2019-06-10 ENCOUNTER — Telehealth: Payer: Self-pay

## 2019-06-10 DIAGNOSIS — R351 Nocturia: Secondary | ICD-10-CM

## 2019-06-10 NOTE — Telephone Encounter (Signed)
Patient contacted the office. He states he was in the office on 05/14/19 and was prescribed Flomax - as he was having urinary frequency.  He states he has been taking the Flomax as prescribed, but he is not getting any relief, and does not feel like this medication is helping. He is wondering if there are any other options or recommendations?

## 2019-06-10 NOTE — Telephone Encounter (Addendum)
Noted. Will review chart and get back with patient tomorrow.

## 2019-06-11 MED ORDER — FINASTERIDE 5 MG PO TABS: 5.0000 mg | ORAL_TABLET | Freq: Every day | ORAL | 0 refills | Status: DC

## 2019-06-11 MED ORDER — BLOOD GLUCOSE METER KIT: PACK | 0 refills | Status: DC

## 2019-06-11 NOTE — Telephone Encounter (Signed)
Seth Larsen, please send him a glucometer kit if he doesn't have one already. Start checking your blood sugar levels 1-2 times daily. Appropriate times to check your blood sugar levels are:  -Before any meal (breakfast, lunch, dinner) -Two hours after any meal (breakfast, lunch, dinner) -Bedtime  Have him send [place] some readings in a few days.  We can try a different medication if his symptoms are secondary to his prostate and BPH, this is called finasteride. Does he want to try? He would stop Flomax.

## 2019-06-11 NOTE — Addendum Note (Signed)
Addended by: Tawnya Crook on: 06/11/2019 04:15 PM   Modules accepted: Orders

## 2019-06-11 NOTE — Telephone Encounter (Signed)
Please verify that his urinary frequency is during the day? Night?  Also, is he checking his blood sugars? What are they running?

## 2019-06-11 NOTE — Telephone Encounter (Signed)
Noted, Rx sent to pharmacy. 

## 2019-06-11 NOTE — Addendum Note (Signed)
Addended by: Doreene Nest on: 06/11/2019 04:27 PM   Modules accepted: Orders

## 2019-06-11 NOTE — Telephone Encounter (Signed)
Spoken and notified patient of Seth Larsen comments. Patient at first did not understand why he would need to check his blood sugar daily because he recently just did a A1C. I hav eexplain to patient the different between the two.  [facility] and patient will check his sugar daily and let us know.   Patient verbalized understanding.

## 2019-06-11 NOTE — Telephone Encounter (Signed)
Patient states he is having the urinary frequency night and day. He states he is going 7-8 times during the day, and he sleeps for 4 hours a night, and he states most nights he is getting up every hour.  Patient states he is not checking his blood sugars, and is unsure what they have been running.

## 2019-06-12 LAB — COLOGUARD: Cologuard: NEGATIVE

## 2019-06-19 NOTE — Telephone Encounter (Signed)
Patient said he went to the pharmacy over a week ago.  Patient said he went to the pharmacy and they said they didn't have a rx for the blood glucose monitor kit.  Patient said he doesn't need to be called and he just wants to make sure it has been sent in to the pharmacy. Patient said he's tried to get this filled twice since he went to the pharmacy with no response.

## 2019-06-20 NOTE — Telephone Encounter (Signed)
Spoken to patient. I have inform him that re-fax Rx on 06/19/2019. [facility] and  They stated that they did not received it. [facility].  Left original Rx for patient in front office just in case

## 2019-06-24 NOTE — Telephone Encounter (Signed)
Pt notified as instructed by Allayne Gitelman NP when to ck BS from this note 06/11/19 at 1:06 pm notation. Pt will send BS readings end of this wk if BS slightly high, sooner if very high or first of next wk. Nothing further needed.

## 2019-06-24 NOTE — Telephone Encounter (Signed)
Pt just got the diabetic meter and

## 2019-07-03 ENCOUNTER — Telehealth: Payer: Self-pay | Admitting: Primary Care

## 2019-07-03 NOTE — Telephone Encounter (Signed)
Noted. Will check on the status of his Cologaurd results.

## 2019-07-03 NOTE — Telephone Encounter (Signed)
Noted  

## 2019-07-03 NOTE — Telephone Encounter (Signed)
Patient called today in regards to the Cologaurd   He stated he has received a letter from them that the cologaurd has been at our office for over a week and he has not heard from anyone.  Is he getting results or does he need an appointment to have it done?

## 2019-07-03 NOTE — Telephone Encounter (Signed)
Notified patient that I was not sure what happen to his results. I went on Cologuard portal. I have found his results and gave him the results. Inform him that it is to be repeated in 3 years.  Place copy in [place] Clark's inbox just in case.

## 2019-07-11 ENCOUNTER — Other Ambulatory Visit: Payer: Self-pay | Admitting: Primary Care

## 2019-07-11 DIAGNOSIS — R351 Nocturia: Secondary | ICD-10-CM

## 2019-07-11 NOTE — Telephone Encounter (Signed)
Please notify patient that I received his blood sugar results and overall they look okay. Have him update me if he starts to see readings consistently at or above 150. We will see him in October as scheduled.

## 2019-07-14 MED ORDER — TAMSULOSIN HCL 0.4 MG PO CAPS: ORAL_CAPSULE | ORAL | 1 refills | Status: DC

## 2019-07-14 NOTE — Telephone Encounter (Signed)
Spoken and notified patient of Seth Larsen comments.   Patient stated he would like a refill that tamsulosin 0.4 mg. He stated that this does help him go less at night. Okay to refill?

## 2019-07-14 NOTE — Telephone Encounter (Signed)
Refills sent to pharmacy. 

## 2019-07-18 ENCOUNTER — Other Ambulatory Visit: Payer: Self-pay | Admitting: Primary Care

## 2019-07-18 DIAGNOSIS — R351 Nocturia: Secondary | ICD-10-CM

## 2019-07-18 DIAGNOSIS — I10 Essential (primary) hypertension: Secondary | ICD-10-CM

## 2019-07-18 NOTE — Telephone Encounter (Signed)
Patient got the urinary frequency medication misup when spoken to patient last month. He meant to refill this RX.

## 2019-07-24 ENCOUNTER — Other Ambulatory Visit: Payer: Self-pay | Admitting: *Deleted

## 2019-07-24 DIAGNOSIS — E785 Hyperlipidemia, unspecified: Secondary | ICD-10-CM

## 2019-07-24 MED ORDER — ATORVASTATIN CALCIUM 10 MG PO TABS: ORAL_TABLET | ORAL | 3 refills | Status: DC

## 2019-07-24 NOTE — Addendum Note (Signed)
Addended by: Maisie Fus on: 07/24/2019 04:05 PM   Modules accepted: Orders

## 2019-07-24 NOTE — Telephone Encounter (Signed)
Pt is requesting a refill of Atorvastatin - requested this from pharmacy a week ago. Pharmacy advised the patient to contact our office as they had sent several requests to [place]. I reviewed the patient's chart and there are no electronic requests from the pharmacy for the Atorvastatin 10mg . Pt has been out of his medication x 4 days.  I apologized for the inconvenience and advised him that given he was just seen for his CPE 05/14/19 and 07/14/19 verified that his levels were stable on current dose I would go ahead and refill x 1 year.   Nothing further needed.

## 2019-07-25 ENCOUNTER — Other Ambulatory Visit: Payer: Self-pay | Admitting: Primary Care

## 2019-07-25 DIAGNOSIS — R351 Nocturia: Secondary | ICD-10-CM

## 2019-08-01 ENCOUNTER — Other Ambulatory Visit: Payer: Self-pay | Admitting: Primary Care

## 2019-08-20 ENCOUNTER — Other Ambulatory Visit: Payer: Self-pay | Admitting: Primary Care

## 2019-09-30 ENCOUNTER — Telehealth: Payer: Self-pay | Admitting: Primary Care

## 2019-09-30 NOTE — Telephone Encounter (Signed)
Patient called and wants to know if he qualifies for the booster shot for immunocompromised?  Patient said he has  Rheumatoid Arthritis and Diabetes. Patient said he had his initial shots in January.  Patient said he goes to all [facility] facilities. Please advise patient.

## 2019-09-30 NOTE — Telephone Encounter (Signed)
Given that he's on immunosuppressive treatment for RA, he should qualify. Marland Kitchen He can set up an appointment through [facility].

## 2019-10-01 ENCOUNTER — Other Ambulatory Visit: Payer: Self-pay | Admitting: Primary Care

## 2019-10-01 DIAGNOSIS — I10 Essential (primary) hypertension: Secondary | ICD-10-CM

## 2019-10-01 NOTE — Telephone Encounter (Signed)
Spoken and notified patient of Seth Larsen comments. Verbalized understanding.  However, patient would like to get it done at [facility] but was told need a note with signature.

## 2019-10-02 NOTE — Telephone Encounter (Signed)
Patient have been notified and note left in the front office

## 2019-10-02 NOTE — Telephone Encounter (Signed)
Please notify patient that his note is ready for pickup. Placed in Chan's in box.

## 2019-11-12 ENCOUNTER — Ambulatory Visit: Payer: No Typology Code available for payment source | Admitting: Primary Care

## 2019-11-12 ENCOUNTER — Other Ambulatory Visit: Payer: Self-pay

## 2019-11-12 VITALS — BP 120/84 | HR 78 | Temp 98.6°F | Ht 70.0 in | Wt 278.0 lb

## 2019-11-12 DIAGNOSIS — M069 Rheumatoid arthritis, unspecified: Secondary | ICD-10-CM

## 2019-11-12 DIAGNOSIS — Z23 Encounter for immunization: Secondary | ICD-10-CM | POA: Diagnosis not present

## 2019-11-12 DIAGNOSIS — M549 Dorsalgia, unspecified: Secondary | ICD-10-CM

## 2019-11-12 DIAGNOSIS — E119 Type 2 diabetes mellitus without complications: Secondary | ICD-10-CM | POA: Diagnosis not present

## 2019-11-12 DIAGNOSIS — G8929 Other chronic pain: Secondary | ICD-10-CM

## 2019-11-12 LAB — POCT GLYCOSYLATED HEMOGLOBIN (HGB A1C): Hemoglobin A1C: 6.4 % — AB (ref 4.0–5.6)

## 2019-11-12 MED ORDER — GABAPENTIN 100 MG PO CAPS: ORAL_CAPSULE | ORAL | 0 refills | Status: DC

## 2019-11-12 MED ORDER — METFORMIN HCL 500 MG PO TABS: 500.0000 mg | ORAL_TABLET | Freq: Two times a day (BID) | ORAL | 3 refills | Status: DC

## 2019-11-12 NOTE — Assessment & Plan Note (Addendum)
A1C today of 6.4, complaint to metformin BID, continue same. Managed on Statin & ARB. Foot exam competed today. Eye Exam is due, Dicussed importance of this. Pneumonia Vaccination: Completed in 2017, UTD  Will Follow up in 6 months.    Agree with assessment and plan. Doreene Nest, NP

## 2019-11-12 NOTE — Progress Notes (Signed)
Subjective:    Patient ID: Seth Larsen, male    DOB: [birth year], 59 y.o.   MRN: [id number]  HPI  This visit occurred during the SARS-CoV-2 public health emergency.  Safety protocols were in place, including screening questions prior to the visit, additional usage of staff PPE, and extensive cleaning of exam room while observing appropriate contact time as indicated for disinfecting solutions.   Mr. Valone is a 59 year old male with a history of hypertension, OSA, type 2 diabetes, RA, hyperlipidemia who presents today for follow up of diabetes.  Current medications include: Metformin 500 mg BID  He is checking his blood glucose infrequently.  Last A1C: 6.5 in March 2021, 6.4 today. Last Eye Exam: Due Last Foot Exam: Due Pneumonia Vaccination: Completed in 2017 ACE/ARB: losartan  Statin: atorvastatin   BP Readings from Last 3 Encounters:  11/12/19 120/84  05/14/19 124/82  11/11/18 130/70   He doesn't believe that his treatment for RA is effective. He is following with rheumatology and is managed on methotrexate and Humira. He has an appointment scheduled for November and plans on discussing this. He is taking gabapentin 200 mg HS with improvement in foot pain.   He also believes that his left sided carpal tunnel has progressed and is ready to seek treatment. History of carpal tunnel release 15 years ago. At the time he was tested, he was notified that his left side was positive, but not nearly as bad as his right.  Review of Systems  Respiratory: Negative for shortness of breath.   Cardiovascular: Negative for chest pain.  Musculoskeletal: Positive for arthralgias.  Skin: Negative for color change.  Neurological: Positive for numbness. Negative for dizziness.       Past Medical History:  Diagnosis Date  . Hypertension   . Type 2 diabetes mellitus (HCC)      Social History   Socioeconomic History  . Marital status: Married    Spouse name: Not on file  . Number of  children: Not on file  . Years of education: Not on file  . Highest education level: Not on file  Occupational History  . Not on file  Tobacco Use  . Smoking status: Never Smoker  . Smokeless tobacco: Current User    Types: Chew  Substance and Sexual Activity  . Alcohol use: No    Alcohol/week: 0.0 standard drinks  . Drug use: No  . Sexual activity: Not on file  Other Topics Concern  . Not on file  Social History Narrative   Married.   Works at numerous occupations.    Enjoys tending to his animals.    Social Determinants of Health   Financial Resource Strain:   . Difficulty of Paying Living Expenses: Not on file  Food Insecurity:   . Worried About Programme researcher, broadcasting/film/video in the Last Year: Not on file  . Ran Out of Food in the Last Year: Not on file  Transportation Needs:   . Lack of Transportation (Medical): Not on file  . Lack of Transportation (Non-Medical): Not on file  Physical Activity:   . Days of Exercise per Week: Not on file  . Minutes of Exercise per Session: Not on file  Stress:   . Feeling of Stress : Not on file  Social Connections:   . Frequency of Communication with Friends and Family: Not on file  . Frequency of Social Gatherings with Friends and Family: Not on file  . Attends Religious Services: Not on  file  . Active Member of Clubs or Organizations: Not on file  . Attends Banker Meetings: Not on file  . Marital Status: Not on file  Intimate Partner Violence:   . Fear of Current or Ex-Partner: Not on file  . Emotionally Abused: Not on file  . Physically Abused: Not on file  . Sexually Abused: Not on file    Past Surgical History:  Procedure Laterality Date  . BACK SURGERY    . CARPAL TUNNEL RELEASE    . KNEE SURGERY    . TOTAL KNEE ARTHROPLASTY      Family History  Problem Relation Age of Onset  . Hypertension Father   . Alzheimer's disease Father   . Diabetes Brother   . Hypertension Mother   . Breast cancer Mother   .  Breast cancer Sister     Allergies  Allergen Reactions  . Lisinopril Other (See Comments)    Myalgias, urinary frequency    Current Outpatient Medications on File Prior to Visit  Medication Sig Dispense Refill  . acetaminophen (TYLENOL) 500 MG tablet Take 500 mg by mouth every 6 (six) hours as needed.    Marland Kitchen amLODipine (NORVASC) 10 MG tablet TAKE 1 TABLET BY MOUTH EVERY DAY 90 tablet 1  . atorvastatin (LIPITOR) 10 MG tablet TAKE 1 TABLET BY MOUTH EVERY DAY IN THE EVENING 90 tablet 3  . DULoxetine (CYMBALTA) 60 MG capsule Take 60 mg by mouth daily.    . finasteride (PROSCAR) 5 MG tablet TAKE 1 TABLET BY MOUTH DAILY FOR URINARY FREQUENCY. 30 tablet 0  . folic acid (FOLVITE) 1 MG tablet Take 1 mg by mouth daily.    Marland Kitchen HUMIRA PEN 40 MG/0.4ML PNKT Inject 40 mLs as directed every 7 (seven) weeks.     Marland Kitchen losartan (COZAAR) 100 MG tablet TAKE 1 TABLET BY MOUTH EVERY DAY FOR BLOOD PRESSURE 90 tablet 1  . methotrexate (50 MG/ML) 1 g injection Inject 8 mg into the vein every 7 (seven) days.    . tamsulosin (FLOMAX) 0.4 MG CAPS capsule SMARTSIG:1 Capsule(s) By Mouth Every Evening     No current facility-administered medications on file prior to visit.    BP 120/84   Pulse 78   Temp 98.6 F (37 C) (Temporal)   Ht 5\' 10"  (1.778 m)   Wt 278 lb (126.1 kg)   SpO2 94%   BMI 39.89 kg/m    Objective:   Physical Exam Cardiovascular:     Rate and Rhythm: Normal rate and regular rhythm.  Pulmonary:     Effort: Pulmonary effort is normal.     Breath sounds: Normal breath sounds.  Musculoskeletal:     Cervical back: Neck supple.  Skin:    General: Skin is warm and dry.            Assessment & Plan:

## 2019-11-12 NOTE — Progress Notes (Signed)
Subjective:    Patient ID: Seth Larsen, male    DOB: 10/14/1960, 59 y.o.   MRN: [id number]  HPI  This visit occurred during the SARS-CoV-2 public health emergency.  Safety protocols were in place, including screening questions prior to the visit, additional usage of staff PPE, and extensive cleaning of exam room while observing appropriate contact time as indicated for disinfecting solutions.   Seth Larsen is a 58 year old male with a history of Hypertension, Type 2 diabetes, rheumatoid arthritis, hyperlipidemia and chronic back pain who presents today for diabetes follow up.   Type 2 diabetes currently managed on Metformin 500mg  BID  He is currently not checking his blood sugars at home. He was keeping a log up until 3 months ago and did send those via my chart. Due to blood sugars being very well controlled he has only checked his blood sugar once or twice since then.   Last A1C: 6.4  Last Eye Exam: Needs to update has been two years  Last Foot Exam: UTD  Pneumonia Vaccination: UTD  ACE/ARB: Losartan Statin: Atorvastatin   Diet is currently fair, He has a busy schedule, he does pack his lunch and try's to avoid fast food or any fried food.Does end up eating late at night and then immediately goes to sleep.   Exercise: No routine exercise, chronic back pain. Does get 7,500 steps a daily while working.   Lab Results  Component Value Date   HGBA1C 6.4 (A) 11/12/2019    Lab Results  Component Value Date   HGBA1C 6.5 05/05/2019   BP Readings from Last 3 Encounters:  11/12/19 120/84  05/14/19 124/82  11/11/18 130/70      Review of Systems  Constitutional: Negative.   Respiratory: Negative.   Cardiovascular: Negative.   Gastrointestinal: Negative.   Endocrine: Negative.   Genitourinary: Negative.   Musculoskeletal: Positive for back pain.  Skin: Negative.   Neurological: Negative.  Negative for dizziness, syncope and light-headedness.  Hematological: Negative.           Past Medical History:  Diagnosis Date  . Hypertension   . Type 2 diabetes mellitus (HCC)      Social History   Socioeconomic History  . Marital status: Married    Spouse name: Not on file  . Number of children: Not on file  . Years of education: Not on file  . Highest education level: Not on file  Occupational History  . Not on file  Tobacco Use  . Smoking status: Never Smoker  . Smokeless tobacco: Current User    Types: Chew  Substance and Sexual Activity  . Alcohol use: No    Alcohol/week: 0.0 standard drinks  . Drug use: No  . Sexual activity: Not on file  Other Topics Concern  . Not on file  Social History Narrative   Married.   Works at numerous occupations.    Enjoys tending to his animals.    Social Determinants of Health   Financial Resource Strain:   . Difficulty of Paying Living Expenses: Not on file  Food Insecurity:   . Worried About 01/11/19 in the Last Year: Not on file  . Ran Out of Food in the Last Year: Not on file  Transportation Needs:   . Lack of Transportation (Medical): Not on file  . Lack of Transportation (Non-Medical): Not on file  Physical Activity:   . Days of Exercise per Week: Not on file  . Minutes  of Exercise per Session: Not on file  Stress:   . Feeling of Stress : Not on file  Social Connections:   . Frequency of Communication with Friends and Family: Not on file  . Frequency of Social Gatherings with Friends and Family: Not on file  . Attends Religious Services: Not on file  . Active Member of Clubs or Organizations: Not on file  . Attends Banker Meetings: Not on file  . Marital Status: Not on file  Intimate Partner Violence:   . Fear of Current or Ex-Partner: Not on file  . Emotionally Abused: Not on file  . Physically Abused: Not on file  . Sexually Abused: Not on file    Past Surgical History:  Procedure Laterality Date  . BACK SURGERY    . CARPAL TUNNEL RELEASE    . KNEE SURGERY     . TOTAL KNEE ARTHROPLASTY      Family History  Problem Relation Age of Onset  . Hypertension Father   . Alzheimer's disease Father   . Diabetes Brother   . Hypertension Mother   . Breast cancer Mother   . Breast cancer Sister     Allergies  Allergen Reactions  . Lisinopril Other (See Comments)    Myalgias, urinary frequency    Current Outpatient Medications on File Prior to Visit  Medication Sig Dispense Refill  . acetaminophen (TYLENOL) 500 MG tablet Take 500 mg by mouth every 6 (six) hours as needed.    Marland Kitchen amLODipine (NORVASC) 10 MG tablet TAKE 1 TABLET BY MOUTH EVERY DAY 90 tablet 1  . atorvastatin (LIPITOR) 10 MG tablet TAKE 1 TABLET BY MOUTH EVERY DAY IN THE EVENING 90 tablet 3  . DULoxetine (CYMBALTA) 60 MG capsule Take 60 mg by mouth daily.    . finasteride (PROSCAR) 5 MG tablet TAKE 1 TABLET BY MOUTH DAILY FOR URINARY FREQUENCY. 30 tablet 0  . folic acid (FOLVITE) 1 MG tablet Take 1 mg by mouth daily.    Marland Kitchen gabapentin (NEURONTIN) 100 MG capsule TAKE 1-3 CAPSULES BY MOUTH AT BEDTIME FOR FOOT PAIN. 270 capsule 0  . HUMIRA PEN 40 MG/0.4ML PNKT Inject 40 mLs as directed every 7 (seven) weeks.     Marland Kitchen losartan (COZAAR) 100 MG tablet TAKE 1 TABLET BY MOUTH EVERY DAY FOR BLOOD PRESSURE 90 tablet 1  . metFORMIN (GLUCOPHAGE) 500 MG tablet Take 1 tablet (500 mg total) by mouth 2 (two) times daily with a meal. 180 tablet 1  . methotrexate (50 MG/ML) 1 g injection Inject 8 mg into the vein every 7 (seven) days.    . tamsulosin (FLOMAX) 0.4 MG CAPS capsule SMARTSIG:1 Capsule(s) By Mouth Every Evening     No current facility-administered medications on file prior to visit.    BP 120/84   Pulse 78   Temp 98.6 F (37 C) (Temporal)   Ht 5\' 10"  (1.778 m)   Wt 278 lb (126.1 kg)   SpO2 94%   BMI 39.89 kg/m       Objective:   Physical Exam Constitutional:      Appearance: Normal appearance.  Pulmonary:     Effort: Pulmonary effort is normal.     Breath sounds: Normal breath  sounds.  Musculoskeletal:        General: Normal range of motion.  Skin:    General: Skin is warm and dry.     Capillary Refill: Capillary refill takes less than 2 seconds.  Neurological:     General:  No focal deficit present.     Mental Status: He is alert and oriented to person, place, and time.  Psychiatric:        Mood and Affect: Mood normal.        Behavior: Behavior normal.           Assessment & Plan:

## 2019-11-12 NOTE — Assessment & Plan Note (Addendum)
Being followed by rheumatology, currently managed on Humira,  Methotrexate.  Due to increase pain during day was advised to begin taking 1-2 tablets of Gabapentin twice daily, was only taking this at bedtime.  He will update in a few weeks.   Agree with assessment and plan. Doreene Nest, NP

## 2019-11-12 NOTE — Assessment & Plan Note (Deleted)
Well controlled in the office today with A1C at 6.4. Continue Metformin 500 mg BID.  Managed on statin and ARB. Foot exam today. He will schedule an eye exam. Pneumonia vaccination UTD.  Follow up in 6 months.

## 2019-11-12 NOTE — Patient Instructions (Addendum)
Take 1-2 tablets of Gabapentin daily for pain.   It is important that you improve your diet. Please limit carbohydrates in the form of white bread, rice, pasta, sweets, fast food, fried food, sugary drinks, etc. Increase your consumption of fresh fruits and vegetables, whole grains, lean protein.  Ensure you are consuming 64 ounces of water daily.  Start exercising. You should be getting 150 minutes of moderate intensity exercise weekly.  It was a pleasure to see you today!    Diabetes Mellitus and Nutrition, Adult When you have diabetes (diabetes mellitus), it is very important to have healthy eating habits because your blood sugar (glucose) levels are greatly affected by what you eat and drink. Eating healthy foods in the appropriate amounts, at about the same times every day, can help you:  Control your blood glucose.  Lower your risk of heart disease.  Improve your blood pressure.  Reach or maintain a healthy weight. Every person with diabetes is different, and each person has different needs for a meal plan. Your health care provider may recommend that you work with a diet and nutrition specialist (dietitian) to make a meal plan that is best for you. Your meal plan may vary depending on factors such as:  The calories you need.  The medicines you take.  Your weight.  Your blood glucose, blood pressure, and cholesterol levels.  Your activity level.  Other health conditions you have, such as heart or kidney disease. How do carbohydrates affect me? Carbohydrates, also called carbs, affect your blood glucose level more than any other type of food. Eating carbs naturally raises the amount of glucose in your blood. Carb counting is a method for keeping track of how many carbs you eat. Counting carbs is important to keep your blood glucose at a healthy level, especially if you use insulin or take certain oral diabetes medicines. It is important to know how many carbs you can safely  have in each meal. This is different for every person. Your dietitian can help you calculate how many carbs you should have at each meal and for each snack. Foods that contain carbs include:  Bread, cereal, rice, pasta, and crackers.  Potatoes and corn.  Peas, beans, and lentils.  Milk and yogurt.  Fruit and juice.  Desserts, such as cakes, cookies, ice cream, and candy. How does alcohol affect me? Alcohol can cause a sudden decrease in blood glucose (hypoglycemia), especially if you use insulin or take certain oral diabetes medicines. Hypoglycemia can be a life-threatening condition. Symptoms of hypoglycemia (sleepiness, dizziness, and confusion) are similar to symptoms of having too much alcohol. If your health care provider says that alcohol is safe for you, follow these guidelines:  Limit alcohol intake to no more than 1 drink per day for nonpregnant women and 2 drinks per day for men. One drink equals 12 oz of beer, 5 oz of wine, or 1 oz of hard liquor.  Do not drink on an empty stomach.  Keep yourself hydrated with water, diet soda, or unsweetened iced tea.  Keep in mind that regular soda, juice, and other mixers may contain a lot of sugar and must be counted as carbs. What are tips for following this plan?  Reading food labels  Start by checking the serving size on the "Nutrition Facts" label of packaged foods and drinks. The amount of calories, carbs, fats, and other nutrients listed on the label is based on one serving of the item. Many items contain more than  one serving per package.  Check the total grams (g) of carbs in one serving. You can calculate the number of servings of carbs in one serving by dividing the total carbs by 15. For example, if a food has 30 g of total carbs, it would be equal to 2 servings of carbs.  Check the number of grams (g) of saturated and trans fats in one serving. Choose foods that have low or no amount of these fats.  Check the number of  milligrams (mg) of salt (sodium) in one serving. Most people should limit total sodium intake to less than 2,300 mg per day.  Always check the nutrition information of foods labeled as "low-fat" or "nonfat". These foods may be higher in added sugar or refined carbs and should be avoided.  Talk to your dietitian to identify your daily goals for nutrients listed on the label. Shopping  Avoid buying canned, premade, or processed foods. These foods tend to be high in fat, sodium, and added sugar.  Shop around the outside edge of the grocery store. This includes fresh fruits and vegetables, bulk grains, fresh meats, and fresh dairy. Cooking  Use low-heat cooking methods, such as baking, instead of high-heat cooking methods like deep frying.  Cook using healthy oils, such as olive, canola, or sunflower oil.  Avoid cooking with butter, cream, or high-fat meats. Meal planning  Eat meals and snacks regularly, preferably at the same times every day. Avoid going long periods of time without eating.  Eat foods high in fiber, such as fresh fruits, vegetables, beans, and whole grains. Talk to your dietitian about how many servings of carbs you can eat at each meal.  Eat 4-6 ounces (oz) of lean protein each day, such as lean meat, chicken, fish, eggs, or tofu. One oz of lean protein is equal to: ? 1 oz of meat, chicken, or fish. ? 1 egg. ?  cup of tofu.  Eat some foods each day that contain healthy fats, such as avocado, nuts, seeds, and fish. Lifestyle  Check your blood glucose regularly.  Exercise regularly as told by your health care provider. This may include: ? 150 minutes of moderate-intensity or vigorous-intensity exercise each week. This could be brisk walking, biking, or water aerobics. ? Stretching and doing strength exercises, such as yoga or weightlifting, at least 2 times a week.  Take medicines as told by your health care provider.  Do not use any products that contain nicotine  or tobacco, such as cigarettes and e-cigarettes. If you need help quitting, ask your health care provider.  Work with a Veterinary surgeon or diabetes educator to identify strategies to manage stress and any emotional and social challenges. Questions to ask a health care provider  Do I need to meet with a diabetes educator?  Do I need to meet with a dietitian?  What number can I call if I have questions?  When are the best times to check my blood glucose? Where to find more information:  American Diabetes Association: diabetes.org  Academy of Nutrition and Dietetics: www.eatright.[employer] of Diabetes and Digestive and Kidney Diseases (NIH): CarFlippers.tn Summary  A healthy meal plan will help you control your blood glucose and maintain a healthy lifestyle.  Working with a diet and nutrition specialist (dietitian) can help you make a meal plan that is best for you.  Keep in mind that carbohydrates (carbs) and alcohol have immediate effects on your blood glucose levels. It is important to count carbs and  to use alcohol carefully. This information is not intended to replace advice given to you by your health care provider. Make sure you discuss any questions you have with your health care provider. Document Revised: 01/05/2017 Document Reviewed: 02/28/2016 Elsevier Patient Education  2020 Elsevier Inc.   Influenza (Flu) Vaccine (Inactivated or Recombinant): What You Need to Know 1. Why get vaccinated? Influenza vaccine can prevent influenza (flu). Flu is a contagious disease that spreads around the [place] every year, usually between October and May. Anyone can get the flu, but it is more dangerous for some people. Infants and young children, people 76 years of age and older, pregnant women, and people with certain health conditions or a weakened immune system are at greatest risk of flu complications. Pneumonia, bronchitis, sinus infections and ear infections are  examples of flu-related complications. If you have a medical condition, such as heart disease, cancer or diabetes, flu can make it worse. Flu can cause fever and chills, sore throat, muscle aches, fatigue, cough, headache, and runny or stuffy nose. Some people may have vomiting and diarrhea, though this is more common in children than adults. Each year thousands of people in the [place] die from flu, and many more are hospitalized. Flu vaccine prevents millions of illnesses and flu-related visits to the doctor each year. 2. Influenza vaccine CDC recommends everyone 38 months of age and older get vaccinated every flu season. Children 6 months through 25 years of age may need 2 doses during a single flu season. Everyone else needs only 1 dose each flu season. It takes about 2 weeks for protection to develop after vaccination. There are many flu viruses, and they are always changing. Each year a new flu vaccine is made to protect against three or four viruses that are likely to cause disease in the upcoming flu season. Even when the vaccine doesn't exactly match these viruses, it may still provide some protection. Influenza vaccine does not cause flu. Influenza vaccine may be given at the same time as other vaccines. 3. Talk with your health care provider Tell your vaccine provider if the person getting the vaccine:  Has had an allergic reaction after a previous dose of influenza vaccine, or has any severe, life-threatening allergies.  Has ever had Guillain-Barr Syndrome (also called GBS). In some cases, your health care provider may decide to postpone influenza vaccination to a future visit. People with minor illnesses, such as a cold, may be vaccinated. People who are moderately or severely ill should usually wait until they recover before getting influenza vaccine. Your health care provider can give you more information. 4. Risks of a vaccine reaction  Soreness, redness, and swelling where  shot is given, fever, muscle aches, and headache can happen after influenza vaccine.  There may be a very small increased risk of Guillain-Barr Syndrome (GBS) after inactivated influenza vaccine (the flu shot). Young children who get the flu shot along with pneumococcal vaccine (PCV13), and/or DTaP vaccine at the same time might be slightly more likely to have a seizure caused by fever. Tell your health care provider if a child who is getting flu vaccine has ever had a seizure. People sometimes faint after medical procedures, including vaccination. Tell your provider if you feel dizzy or have vision changes or ringing in the ears. As with any medicine, there is a very remote chance of a vaccine causing a severe allergic reaction, other serious injury, or death. 5. What if there is a serious problem? An  allergic reaction could occur after the vaccinated person leaves the clinic. If you see signs of a severe allergic reaction (hives, swelling of the face and throat, difficulty breathing, a fast heartbeat, dizziness, or weakness), call 9-1-1 and get the person to the nearest hospital. For other signs that concern you, call your health care provider. Adverse reactions should be reported to the Vaccine Adverse Event Reporting System (VAERS). Your health care provider will usually file this report, or you can do it yourself. Visit the VAERS website at www.vaers.LAgents.no or call 937-205-5009.VAERS is only for reporting reactions, and VAERS staff do not give medical advice. 6. The National Vaccine Injury Compensation Program The [employer] Vaccine Injury Compensation Program (VICP) is a federal program that was created to compensate people who may have been injured by certain vaccines. Visit the VICP website at SpiritualWord.at or call 620-732-7552 to learn about the program and about filing a claim. There is a time limit to file a claim for compensation. 7. How can I learn more?  Ask your  healthcare provider.  Call your local or state health department.  Contact the Centers for Disease Control and Prevention (CDC): ? Call (743)337-5944 (1-800-CDC-INFO) or ? Visit CDC's BiotechRoom.com.cy Vaccine Information Statement (Interim) Inactivated Influenza Vaccine (09/20/2017) This information is not intended to replace advice given to you by your health care provider. Make sure you discuss any questions you have with your health care provider. Document Revised: 05/14/2018 Document Reviewed: 09/24/2017

## 2019-11-13 ENCOUNTER — Ambulatory Visit: Payer: No Typology Code available for payment source | Admitting: Primary Care

## 2019-12-18 ENCOUNTER — Other Ambulatory Visit: Payer: Self-pay | Admitting: Primary Care

## 2019-12-18 DIAGNOSIS — M2550 Pain in unspecified joint: Secondary | ICD-10-CM

## 2019-12-18 DIAGNOSIS — M549 Dorsalgia, unspecified: Secondary | ICD-10-CM

## 2019-12-18 DIAGNOSIS — G8929 Other chronic pain: Secondary | ICD-10-CM

## 2020-01-09 ENCOUNTER — Other Ambulatory Visit: Payer: Self-pay | Admitting: Primary Care

## 2020-01-09 DIAGNOSIS — I10 Essential (primary) hypertension: Secondary | ICD-10-CM

## 2020-02-08 ENCOUNTER — Other Ambulatory Visit: Payer: Self-pay | Admitting: Primary Care

## 2020-02-08 DIAGNOSIS — R351 Nocturia: Secondary | ICD-10-CM

## 2020-02-19 ENCOUNTER — Telehealth: Payer: Self-pay | Admitting: Primary Care

## 2020-02-19 NOTE — Telephone Encounter (Signed)
Pt called in wanted to know about getting something for his cold his wife is being seen for the samething with Dr. Para March

## 2020-02-19 NOTE — Telephone Encounter (Signed)
Returning call.

## 2020-02-19 NOTE — Telephone Encounter (Signed)
Patient returned call. States that he has had cough productive with congestion x 2 days. Everyone in home is sick. Wife was seen by [place] today and called in antibiotic. Let Patient know that he would need virtual appointment to have any medications called in. He will need to check with wife to see when she is available to make a virtual appointment. He will call back as soon as he checks with her. Reviewed red words that he would need to be seen at ED.

## 2020-02-19 NOTE — Telephone Encounter (Signed)
Noted, will evaluate once he schedules an appointment.

## 2020-02-19 NOTE — Telephone Encounter (Signed)
Left message to return call to our office.  

## 2020-02-20 ENCOUNTER — Other Ambulatory Visit: Payer: Self-pay

## 2020-02-20 ENCOUNTER — Emergency Department (HOSPITAL_BASED_OUTPATIENT_CLINIC_OR_DEPARTMENT_OTHER)
Admission: EM | Admit: 2020-02-20 | Discharge: 2020-02-20 | Disposition: A | Discharge status: Home / Self Care | Payer: No Typology Code available for payment source | Attending: Emergency Medicine | Admitting: Emergency Medicine

## 2020-02-20 ENCOUNTER — Emergency Department (HOSPITAL_BASED_OUTPATIENT_CLINIC_OR_DEPARTMENT_OTHER): Payer: No Typology Code available for payment source

## 2020-02-20 ENCOUNTER — Encounter (HOSPITAL_BASED_OUTPATIENT_CLINIC_OR_DEPARTMENT_OTHER): Payer: Self-pay

## 2020-02-20 DIAGNOSIS — R0602 Shortness of breath: Secondary | ICD-10-CM | POA: Insufficient documentation

## 2020-02-20 DIAGNOSIS — Z79899 Other long term (current) drug therapy: Secondary | ICD-10-CM | POA: Insufficient documentation

## 2020-02-20 DIAGNOSIS — R600 Localized edema: Secondary | ICD-10-CM | POA: Insufficient documentation

## 2020-02-20 DIAGNOSIS — E785 Hyperlipidemia, unspecified: Secondary | ICD-10-CM | POA: Insufficient documentation

## 2020-02-20 DIAGNOSIS — F17220 Nicotine dependence, chewing tobacco, uncomplicated: Secondary | ICD-10-CM | POA: Diagnosis not present

## 2020-02-20 DIAGNOSIS — R059 Cough, unspecified: Secondary | ICD-10-CM | POA: Diagnosis not present

## 2020-02-20 DIAGNOSIS — I10 Essential (primary) hypertension: Secondary | ICD-10-CM | POA: Diagnosis not present

## 2020-02-20 DIAGNOSIS — Z7984 Long term (current) use of oral hypoglycemic drugs: Secondary | ICD-10-CM | POA: Diagnosis not present

## 2020-02-20 DIAGNOSIS — Z20822 Contact with and (suspected) exposure to covid-19: Secondary | ICD-10-CM | POA: Insufficient documentation

## 2020-02-20 DIAGNOSIS — R0981 Nasal congestion: Secondary | ICD-10-CM | POA: Diagnosis not present

## 2020-02-20 DIAGNOSIS — E1169 Type 2 diabetes mellitus with other specified complication: Secondary | ICD-10-CM | POA: Insufficient documentation

## 2020-02-20 HISTORY — DX: Unspecified osteoarthritis, unspecified site: M19.90

## 2020-02-20 LAB — BASIC METABOLIC PANEL
Anion gap: 10 (ref 5–15)
BUN: 15 mg/dL (ref 8–23)
CO2: 22 mmol/L (ref 22–32)
Calcium: 8.7 mg/dL — ABNORMAL LOW (ref 8.9–10.3)
Chloride: 104 mmol/L (ref 98–111)
Creatinine, Ser: 0.78 mg/dL (ref 0.61–1.24)
GFR, Estimated: >60 mL/min (ref >60)
Glucose, Bld: 179 mg/dL — ABNORMAL HIGH (ref 70–99)
Potassium: 3.8 mmol/L (ref 3.5–5.1)
Sodium: 136 mmol/L (ref 135–145)

## 2020-02-20 LAB — TROPONIN I (HIGH SENSITIVITY)
Troponin I (High Sensitivity): 10 ng/L (ref <18)
Troponin I (High Sensitivity): 10 ng/L (ref <18)

## 2020-02-20 LAB — CBC WITH DIFFERENTIAL/PLATELET
Abs Immature Granulocytes: 0.03 10*3/uL (ref 0.00–0.07)
Basophils Absolute: 0 10*3/uL (ref 0.0–0.1)
Basophils Relative: 0 %
Eosinophils Absolute: 0.1 10*3/uL (ref 0.0–0.5)
Eosinophils Relative: 2 %
HCT: 43 % (ref 39.0–52.0)
Hemoglobin: 14.3 g/dL (ref 13.0–17.0)
Immature Granulocytes: 1 %
Lymphocytes Relative: 33 %
Lymphs Abs: 1.7 10*3/uL (ref 0.7–4.0)
MCH: 30.6 pg (ref 26.0–34.0)
MCHC: 33.3 g/dL (ref 30.0–36.0)
MCV: 91.9 fL (ref 80.0–100.0)
Monocytes Absolute: 0.9 10*3/uL (ref 0.1–1.0)
Monocytes Relative: 18 %
Neutro Abs: 2.4 10*3/uL (ref 1.7–7.7)
Neutrophils Relative %: 46 %
Platelets: 147 10*3/uL — ABNORMAL LOW (ref 150–400)
RBC: 4.68 MIL/uL (ref 4.22–5.81)
RDW: 14.9 % (ref 11.5–15.5)
WBC: 5.1 10*3/uL (ref 4.0–10.5)
nRBC: 0 % (ref 0.0–0.2)

## 2020-02-20 LAB — SARS CORONAVIRUS 2 (TAT 6-24 HRS): SARS Coronavirus 2: NEGATIVE

## 2020-02-20 LAB — BRAIN NATRIURETIC PEPTIDE: B Natriuretic Peptide: 11.3 pg/mL (ref 0.0–100.0)

## 2020-02-20 NOTE — Discharge Instructions (Addendum)
If you had any testing done today, the results will show up on your MyChart phone app in 24 hours.  Call your primary care doctor in the next 1-2 days to arrange video follow-up.   Use a finger pulse oximeter at home.  You may purchase one at [facility] or [facility] or online.  If the numbers drops and stays below 90%, return immediately back to the ER.  Otherwise increase your fluid intake, isolate at home for 5 days after symptoms resolve, and inform recent close contacts of the need to test for Covid.  

## 2020-02-20 NOTE — ED Triage Notes (Signed)
Pt arrives ambulatory with c/o cold like symptoms after being around his granddaughter that was sick. Pt reports being around multiple sick people, all who have tested negative for Covid. Pt reports using a nose spray which helped but noticed yesterday while walking at working he became very SOB having to sit during that time to catch his breath.

## 2020-02-20 NOTE — Telephone Encounter (Signed)
[facility] - Client TELEPHONE ADVICE RECORD [facility] Patient Name: Seth Larsen Gender: Male DOB: [birth year] Age: 60 Y 4 M 16 D Return Phone Number: [phone] (Primary), [phone] (Secondary) Address[place] [address] Client [facility] - Client Client Site [facility] - Day Physician Vernona Rieger - NP Contact Type Call Who Is Calling Patient / Member / Family / Caregiver Call Type Triage / Clinical Relationship To Patient Self Return Phone Number [phone] (Primary) Chief Complaint BREATHING - shortness of breath or sounds breathless Reason for Call Symptomatic / Request for Health Information Initial Comment Pt has a cough, congestion and shortness of breath. Translation No Nurse Assessment Nurse: Annye English, RN, Denise Date/Time (Eastern Time): 02/19/2020 4:34:11 PM Confirm and document reason for call. If symptomatic, describe symptoms. ---Pt has a cough, congestion and shortness of breath. Vaccinated but not tested yet. Does the patient have any new or worsening symptoms? ---Yes Will a triage be completed? ---Yes Related visit to physician within the last 2 weeks? ---No Does the PT have any chronic conditions? (i.e. diabetes, asthma, this includes High risk factors for pregnancy, etc.) ---Yes List chronic conditions. ---HTN, Diabetes Is this a behavioral health or substance abuse call? ---No Guidelines Guideline Title Affirmed Question Affirmed Notes Nurse Date/Time (Eastern Time) COVID-19 - Diagnosed or Suspected [1] COVID-19 infection suspected by caller or triager AND [2] mild symptoms (cough, fever, or others) AND [3] has not gotten tested yet Annye English, RN, Angelique Blonder 02/19/2020 4:37:11 PM Disp. Time [name] Time) Disposition Final User 02/19/2020 4:32:22 PM Send to Urgent Alena Bills 02/19/2020 4:47:06 PM Call Completed Annye English, RN, Angelique Blonder 02/19/2020  4:41:10 PM Call PCP when Office is Open Yes Carmon, RN, Angelique Blonder PLEASE NOTE: All timestamps contained within this report are represented as [place] Standard Time. CONFIDENTIALTY NOTICE: This fax transmission is intended only for the addressee. It contains information that is legally privileged, confidential or otherwise protected from use or disclosure. If you are not the intended recipient, you are strictly prohibited from reviewing, disclosing, copying using or disseminating any of this information or taking any action in reliance on or regarding this information. If you have received this fax in error, please notify us immediately by telephone so that we can arrange for its return to [place]. Phone: [phone], Toll-Free: [phone], Fax: [phone] Page: 2 of 2 Call Id: [id number] Caller Disagree/Comply Comply Caller Understands Yes PreDisposition Home Care Care Advice Given Per Guideline CALL PCP WHEN OFFICE IS OPEN: ALTERNATE DISPOSITION - LOCAL CLINIC OR URGENT CARE CENTER: * Many clinics, retail clinics (such as [facility] or [facility]), and urgent care centers perform COVID-19 testing. GENERAL CARE ADVICE FOR COVID-19 SYMPTOMS: * The treatment is the same whether you have COVID-19, influenza or some other respiratory virus. * Cough: Use cough drops. * Feeling dehydrated: Drink extra liquids. If the air in your home is dry, use a humidifier. * Fever: For fever over 101 F (38.3 C), take acetaminophen every 4 to 6 hours (Adults 650 mg) OR ibuprofen every 6 to 8 hours (Adults 400 mg). Before taking any medicine, read all the instructions on the package. Do not take aspirin unless your doctor has prescribed it for you. * Muscle aches, headache, and other pains: Often this comes and goes with the fever. Take acetaminophen every 4 to 6 hours (Adults 650 mg) OR ibuprofen every 6 to 8 hours (Adults 400 mg). Before taking any medicine, read all the instructions on the package. *  Sore throat: Try throat  lozenges, hard candy or warm chicken broth. COUGH MEDICINES: * COUGH SYRUP WITH DEXTROMETHORPHAN: An over-the-counter cough syrup can help your cough. The most common cough suppressant in overthe-counter cough medicines is dextromethorphan. AVOID TOBACCO SMOKE: * Avoid tobacco smoke. HOW TO PROTECT OTHERS - WHEN YOU ARE SICK WITH COVID-19: * COVER THE COUGH: Cough and sneeze into your shirt sleeve or inner elbow. Don't cough into your hand or the air. If available, cough into a tissue and throw it into a trash can. * STAY HOME A MINIMUM OF 10 DAYS: Home isolation is needed for at least 10 days after the symptoms started. Stay home from school or work if you are sick. Do NOT go to religious services, child care centers, shopping, or other public places. Do NOT use public transportation (e.g., bus, taxis, ride-sharing). Do NOT allow any visitors to your home. Leave the house only if you need to seek urgent medical care. * WASH HANDS OFTEN: Wash hands often with soap and water. After coughing or sneezing are important times. If soap and water are not available, use an alcohol-based hand sanitizer with at least 60% alcohol, covering all surfaces of your hands and rubbing them together until they feel dry. Avoid touching your eyes, nose, and mouth with unwashed hands. * WEAR A MASK: Wear a facemask when around others. Always wear a facemask (if available) if you have to leave your home (such as going to a medical facility). CALL BACK IF: * Chest pain or difficulty breathing occurs * You become worse CARE ADVICE given per COVID-19 - DIAGNOSED OR SUSPECTED (Adult) guideline. Comments User: [name], RN Date/Time [name] Time): 02/19/2020 4:38:51 PM Pt DENIES SOB during triage. Referrals REFERRED TO PCP OFFICE

## 2020-02-20 NOTE — ED Provider Notes (Signed)
[facility] EMERGENCY DEPARTMENT Provider Note   CSN: [id number] Arrival date & time: 02/20/20  8546     History Chief Complaint  Patient presents with  . Nasal Congestion  . Shortness of Breath    Seth Larsen is a 60 y.o. male.  Patient presents to ER chief complaint of shortness of breath nasal congestion mild cough.  Symptoms been ongoing for about 2 weeks.  He states that he had contact with some family members who had upper respiratory infection but that they had tested negative for COVID however the patient is concerned that he may have contracted COVID.  Denies vomiting or diarrhea.  Denies chest pain.        Past Medical History:  Diagnosis Date  . Arthritis   . Hypertension   . Type 2 diabetes mellitus [facility])     Patient Active Problem List   Diagnosis Date Noted  . Nocturia 05/14/2019  . Rheumatoid arthritis (HCC) 08/06/2018  . Hyperlipidemia 07/23/2015  . Type 2 diabetes mellitus without complication, without long-term current use of insulin (HCC) 07/23/2015  . Preventative health care 07/23/2015  . OSA (obstructive sleep apnea) 07/06/2015  . Essential hypertension 07/06/2015  . Chronic back pain 07/06/2015    Past Surgical History:  Procedure Laterality Date  . BACK SURGERY    . CARPAL TUNNEL RELEASE    . KNEE SURGERY    . TOTAL KNEE ARTHROPLASTY         Family History  Problem Relation Age of Onset  . Hypertension Father   . Alzheimer's disease Father   . Diabetes Brother   . Hypertension Mother   . Breast cancer Mother   . Breast cancer Sister     Social History   Tobacco Use  . Smoking status: Never Smoker  . Smokeless tobacco: Current User    Types: Chew  Substance Use Topics  . Alcohol use: No    Alcohol/week: 0.0 standard drinks  . Drug use: No    Home Medications Prior to Admission medications   Medication Sig Start Date End Date Taking? Authorizing Provider  acetaminophen (TYLENOL) 500 MG tablet Take 500 mg  by mouth every 6 (six) hours as needed.    [provider]  amLODipine (NORVASC) 10 MG tablet TAKE 1 TABLET BY MOUTH EVERY DAY 10/01/19   Doreene Nest, NP  atorvastatin (LIPITOR) 10 MG tablet TAKE 1 TABLET BY MOUTH EVERY DAY IN THE EVENING 07/24/19   Doreene Nest, NP  DULoxetine (CYMBALTA) 60 MG capsule Take 60 mg by mouth daily. 04/02/19   [provider]  finasteride (PROSCAR) 5 MG tablet TAKE 1 TABLET BY MOUTH DAILY FOR URINARY FREQUENCY. 07/18/19   Doreene Nest, NP  folic acid (FOLVITE) 1 MG tablet Take 1 mg by mouth daily.    [provider]  gabapentin (NEURONTIN) 100 MG capsule TAKE 1-2 CAPSULES BY MOUTH TWICE DAILY FOR PAIN. 12/18/19   Doreene Nest, NP  HUMIRA PEN 40 MG/0.4ML PNKT Inject 40 mLs as directed every 7 (seven) weeks.  05/06/19   [provider]  losartan (COZAAR) 100 MG tablet TAKE 1 TABLET BY MOUTH EVERY DAY FOR BLOOD PRESSURE 01/09/20   Doreene Nest, NP  metFORMIN (GLUCOPHAGE) 500 MG tablet Take 1 tablet (500 mg total) by mouth 2 (two) times daily with a meal. For diabetes. 11/12/19   Doreene Nest, NP  methotrexate (50 MG/ML) 1 g injection Inject 8 mg into the vein every 7 (seven)  days.    [provider]  tamsulosin (FLOMAX) 0.4 MG CAPS capsule TAKE 1 CAPSULE BY MOUTH EVERY EVENING FOR FREQUENT URINATION. 02/09/20   Doreene Nest, NP    Allergies    Lisinopril  Review of Systems   Review of Systems  Constitutional: Negative for fever.  HENT: Negative for ear pain and sore throat.   Eyes: Negative for pain.  Respiratory: Positive for cough and shortness of breath.   Cardiovascular: Negative for chest pain.  Gastrointestinal: Negative for abdominal pain.  Genitourinary: Negative for flank pain.  Musculoskeletal: Negative for back pain.  Skin: Negative for color change and rash.  Neurological: Negative for syncope.  All other systems reviewed and are negative.   Physical Exam Updated  Vital Signs BP 135/79   Pulse 66   Temp 98.1 F (36.7 C) (Oral)   Resp 17   Ht 5\' 10"  (1.778 m)   Wt 122.5 kg   SpO2 96%   BMI 38.74 kg/m   Physical Exam Constitutional:      General: He is not in acute distress.    Appearance: He is well-developed.  HENT:     Head: Normocephalic.     Nose: Nose normal.  Eyes:     Extraocular Movements: Extraocular movements intact.  Cardiovascular:     Rate and Rhythm: Normal rate.  Pulmonary:     Effort: Pulmonary effort is normal.  Musculoskeletal:     Right lower leg: Edema present.     Left lower leg: Edema present.     Comments: 1+ pitting edema bilateral lower extremities  Skin:    Coloration: Skin is not jaundiced.  Neurological:     Mental Status: He is alert. Mental status is at baseline.     ED Results / Procedures / Treatments   Labs (all labs ordered are listed, but only abnormal results are displayed) Labs Reviewed  BASIC METABOLIC PANEL - Abnormal; Notable for the following components:      Result Value   Glucose, Bld 179 (*)    Calcium 8.7 (*)    All other components within normal limits  CBC WITH DIFFERENTIAL/PLATELET - Abnormal; Notable for the following components:   Platelets 147 (*)    All other components within normal limits  SARS CORONAVIRUS 2 (TAT 6-24 HRS)  BRAIN NATRIURETIC PEPTIDE  TROPONIN I (HIGH SENSITIVITY)  TROPONIN I (HIGH SENSITIVITY)    EKG EKG Interpretation  Date/Time:  Friday February 20 2020 09:07:56 EST Ventricular Rate:  72 PR Interval:    QRS Duration: 105 QT Interval:  373 QTC Calculation: 409 R Axis:   87 Text Interpretation: Sinus rhythm Consider left atrial enlargement Borderline right axis deviation Low voltage, precordial leads Baseline wander in lead(s) II III aVF Confirmed by [id number] ([id number]) on 02/20/2020 10:53:01 AM   Radiology DG Chest 1 View  Result Date: 02/20/2020 CLINICAL DATA:  60 year old male with shortness of breath for 2 weeks. Nasal congestion. EXAM:  CHEST  1 VIEW COMPARISON:  CT Abdomen and Pelvis 07/12/2006. FINDINGS: AP view at 0951 hours. Cardiac size and mediastinal contours within normal limits. Low normal lung volumes. Visualized tracheal air column is within normal limits. Allowing for portable technique the lungs are clear. No pneumothorax. No acute osseous abnormality identified. Paucity of bowel gas in the upper abdomen. IMPRESSION: Negative portable chest. Electronically Signed   By: 09/11/2006 M.D.   On: 02/20/2020 10:14    Procedures Procedures (including critical care time)  Medications Ordered in ED  Medications - No data to display  ED Course  I have reviewed the triage vital signs and the nursing notes.  Pertinent labs & imaging results that were available during my care of the patient were reviewed by me and considered in my medical decision making (see chart for details).    MDM Rules/Calculators/A&P                          Patient had some mild to moderate bilateral leg edema.  proBNP is normal however.  White count is normal.  Chest x-ray is unremarkable per radiology.  COVID test is sent and pending final result.  Recommending isolating at home while awaiting test results, use of finger pulse oximeter use at home, advised immediate return if the number is persistently below 90%, if she has worsening symptoms, trouble breathing or any additional concerns to return immediately back to the ER.  Otherwise advised follow-up with her primary care doctor in 2 or 3 days.  Final Clinical Impression(s) / ED Diagnoses Final diagnoses:  Suspected 2019 novel coronavirus infection    Rx / DC Orders ED Discharge Orders    None       Cheryll Cockayne, MD 02/20/20 1220

## 2020-02-21 ENCOUNTER — Telehealth: Payer: No Typology Code available for payment source | Admitting: Primary Care

## 2020-03-04 ENCOUNTER — Ambulatory Visit: Payer: No Typology Code available for payment source | Admitting: Primary Care

## 2020-03-04 ENCOUNTER — Encounter: Payer: Self-pay | Admitting: Primary Care

## 2020-03-04 ENCOUNTER — Other Ambulatory Visit: Payer: Self-pay

## 2020-03-04 DIAGNOSIS — J069 Acute upper respiratory infection, unspecified: Secondary | ICD-10-CM | POA: Diagnosis not present

## 2020-03-04 HISTORY — DX: Acute upper respiratory infection, unspecified: J06.9

## 2020-03-04 NOTE — Patient Instructions (Signed)
Please notify me if your symptoms do not improve, especially your exertional shortness of breath.   It was a pleasure to see you today!

## 2020-03-04 NOTE — Assessment & Plan Note (Signed)
ED visit for URI symptoms, negative Covid-19 test. ED labs, imaging, notes reviewed.  He is improving which is reassuring. Residual exertional SOB which is also improving. Chest xray negative from ED visit, lungs clear today.  I highly recommended that he update if symptoms do not continue to improve, especially exertional SOB.

## 2020-03-04 NOTE — Progress Notes (Signed)
Subjective:    Patient ID: Seth Larsen, male    DOB: [birth year], 60 y.o.   MRN: [id number]  HPI  This visit occurred during the SARS-CoV-2 public health emergency.  Safety protocols were in place, including screening questions prior to the visit, additional usage of staff PPE, and extensive cleaning of exam room while observing appropriate contact time as indicated for disinfecting solutions.   Seth Larsen is a 60 year old male with a history of hypertension, OSA, type 2 diabetes, RA, chronic back pain, hyperlipidemia who presents today for emergency department follow up.  He presented to [facility] on 02/20/20 with a chief complaint of shortness of breath, nasal congestion, mild cough. He had recently been exposed to some family members who had URI symptoms, they tested negative for Covid-19 but he was concerned that he had contracted Covid-19.   During his ED visit he underwent BNP for lower extremity edema which was normal. Other labs negative. Chest xray negative, ECG without acute process. He was tested for Covid-19 which was negative. He was advised to use a pulse oximeter at home to monitor pulse ox levels. He was discharged home later that day.  Since his ED visit he's improved. He continues to notice exertional shortness of breath that improves with rest. He hears a "crackling noise" when breathing out. He denies chest pain, fevers. He's had three vaccines for Covid-19.   Review of Systems  Constitutional: Negative for chills and fever.  HENT: Negative for congestion, postnasal drip and sore throat.   Respiratory:       Exertional shortness of breath, improving.  Cardiovascular: Negative for chest pain.     Past Medical History:  Diagnosis Date  . Arthritis   . Hypertension   . Type 2 diabetes mellitus (HCC)      Social History   Socioeconomic History  . Marital status: Married    Spouse name: Not on file  . Number of children: Not on file  . Years of education: Not on  file  . Highest education level: Not on file  Occupational History  . Not on file  Tobacco Use  . Smoking status: Never Smoker  . Smokeless tobacco: Current User    Types: Chew  Substance and Sexual Activity  . Alcohol use: No    Alcohol/week: 0.0 standard drinks  . Drug use: No  . Sexual activity: Not on file  Other Topics Concern  . Not on file  Social History Narrative   Married.   Works at numerous occupations.    Enjoys tending to his animals.    Social Determinants of Health   Financial Resource Strain: Not on file  Food Insecurity: Not on file  Transportation Needs: Not on file  Physical Activity: Not on file  Stress: Not on file  Social Connections: Not on file  Intimate Partner Violence: Not on file    Past Surgical History:  Procedure Laterality Date  . BACK SURGERY    . CARPAL TUNNEL RELEASE    . KNEE SURGERY    . TOTAL KNEE ARTHROPLASTY      Family History  Problem Relation Age of Onset  . Hypertension Father   . Alzheimer's disease Father   . Diabetes Brother   . Hypertension Mother   . Breast cancer Mother   . Breast cancer Sister     Allergies  Allergen Reactions  . Lisinopril Other (See Comments)    Myalgias, urinary frequency    Current Outpatient Medications on  File Prior to Visit  Medication Sig Dispense Refill  . acetaminophen (TYLENOL) 500 MG tablet Take 500 mg by mouth every 6 (six) hours as needed.    Marland Kitchen amLODipine (NORVASC) 10 MG tablet TAKE 1 TABLET BY MOUTH EVERY DAY 90 tablet 1  . atorvastatin (LIPITOR) 10 MG tablet TAKE 1 TABLET BY MOUTH EVERY DAY IN THE EVENING 90 tablet 3  . DULoxetine (CYMBALTA) 60 MG capsule Take 60 mg by mouth daily.    . finasteride (PROSCAR) 5 MG tablet TAKE 1 TABLET BY MOUTH DAILY FOR URINARY FREQUENCY. 30 tablet 0  . folic acid (FOLVITE) 1 MG tablet Take 1 mg by mouth daily.    Marland Kitchen gabapentin (NEURONTIN) 100 MG capsule TAKE 1-2 CAPSULES BY MOUTH TWICE DAILY FOR PAIN. 270 capsule 0  . HUMIRA PEN 40  MG/0.4ML PNKT Inject 40 mLs as directed every 7 (seven) weeks.     Marland Kitchen losartan (COZAAR) 100 MG tablet TAKE 1 TABLET BY MOUTH EVERY DAY FOR BLOOD PRESSURE 90 tablet 1  . metFORMIN (GLUCOPHAGE) 500 MG tablet Take 1 tablet (500 mg total) by mouth 2 (two) times daily with a meal. For diabetes. 180 tablet 3  . methotrexate (50 MG/ML) 1 g injection Inject 8 mg into the vein every 7 (seven) days.    . tamsulosin (FLOMAX) 0.4 MG CAPS capsule TAKE 1 CAPSULE BY MOUTH EVERY EVENING FOR FREQUENT URINATION. 90 capsule 1   No current facility-administered medications on file prior to visit.    BP 140/76   Pulse 91   Temp (!) 96.4 F (35.8 C)   Wt 290 lb (131.5 kg)   SpO2 95%   BMI 41.61 kg/m    Objective:   Physical Exam Constitutional:      Appearance: He is well-nourished. He is not ill-appearing.  Cardiovascular:     Rate and Rhythm: Normal rate and regular rhythm.  Pulmonary:     Effort: Pulmonary effort is normal.     Breath sounds: Normal breath sounds. No wheezing or rhonchi.  Musculoskeletal:     Cervical back: Neck supple.  Skin:    General: Skin is warm and dry.  Neurological:     Mental Status: He is alert.  Psychiatric:        Mood and Affect: Mood and affect normal.            Assessment & Plan:

## 2020-03-21 ENCOUNTER — Other Ambulatory Visit: Payer: Self-pay | Admitting: Primary Care

## 2020-03-21 DIAGNOSIS — I10 Essential (primary) hypertension: Secondary | ICD-10-CM

## 2020-03-30 ENCOUNTER — Telehealth: Payer: Self-pay

## 2020-03-30 DIAGNOSIS — I10 Essential (primary) hypertension: Secondary | ICD-10-CM

## 2020-03-30 MED ORDER — OLMESARTAN MEDOXOMIL 20 MG PO TABS: 20.0000 mg | ORAL_TABLET | Freq: Every day | ORAL | 0 refills | Status: DC

## 2020-03-30 NOTE — Telephone Encounter (Signed)
Fax received from pharmacy Losartan on back order with no release date.  

## 2020-03-30 NOTE — Telephone Encounter (Signed)
Please notify patient regarding the back order of losartan. We will switch to olmesartan 20 mg daily for blood pressure.  Have him monitor his blood pressure and notify me if he sees readings at or above 130 on top, at or above 90 on bottom, consistently 2 weeks after we make the switch.

## 2020-04-01 NOTE — Telephone Encounter (Signed)
Called patient reviewed all information and repeated back to me. Will call if any questions.  Will call if elevated readings after two weeks. Or any changes.

## 2020-04-02 ENCOUNTER — Telehealth: Payer: Self-pay

## 2020-04-02 NOTE — Telephone Encounter (Signed)
I spoke with pt; pt has had swelling in hands and feet and lower legs.hands are more sore than usual for couple of weeks. There is redness on both legs and feet are painful. No warmth noted. Pt said he feels fine. No CP or SOB. pts schedule does not permit time to wait at [facility]; pt has already scheduled appt with Allayne Gitelman NP on 04/06/20. Pt said was offered sooner appt with another provider but pt prefers to see Allayne Gitelman NP. UC & ED precautions given and pt voiced understanding. Sending note to Allayne Gitelman NP and [facility] CMA.

## 2020-04-02 NOTE — Telephone Encounter (Signed)
[facility] Day - Client TELEPHONE ADVICE RECORD [facility] Patient Name: Seth Larsen Gender: Male DOB: 06/27/1960 Age: 60 Y 5 M 28 D Return Phone Number: [phone] (Primary) Address[place] [address] Client [name] Day - Client Client Site  [name] - Day Physician Vernona Rieger - NP Contact Type Call Who Is Calling Patient / Member / Family / Caregiver Call Type Triage / Clinical Relationship To Patient Self Return Phone Number [phone] (Primary) Chief Complaint Swelling (generalized) Reason for Call Symptomatic / Request for Health Information Initial Comment Office is transferring pt with fluid retention, swelling in hands, feet, and bleeding if he touches/ scratches hands or feet. Diabetic. Translation No Nurse Assessment Nurse: [name], RN, Dwana Curd Date/Time (Eastern Time): 04/02/2020 8:42:29 AM Confirm and document reason for call. If symptomatic, describe symptoms. ---Caller states he has swelling in his hands and both legs to his knees. gets scratches on his legs and hands. no fever. Does the patient have any new or worsening symptoms? ---Yes Will a triage be completed? ---Yes Related visit to physician within the last 2 weeks? ---No Does the PT have any chronic conditions? (i.e. diabetes, asthma, this includes High risk factors for pregnancy, etc.) ---Yes List chronic conditions. ---RA; diabetes Is this a behavioral health or substance abuse call? ---No Guidelines Guideline Title Affirmed Question Affirmed Notes Nurse Date/Time [name] Time) Leg Swelling and Edema [1] MODERATE leg swelling (e.g., swelling extends up to knees) AND [2] new-onset or worsening [name], RN, Dwana Curd 04/02/2020 8:46:23 AM Disp. Time [name] Time) Disposition Final User 04/02/2020 8:50:56 AM See PCP within 24 Hours Yes [name], RN, [name] Disagree/Comply Comply PLEASE NOTE:  All timestamps contained within this report are represented as [place] Standard Time. CONFIDENTIALTY NOTICE: This fax transmission is intended only for the addressee. It contains information that is legally privileged, confidential or otherwise protected from use or disclosure. If you are not the intended recipient, you are strictly prohibited from reviewing, disclosing, copying using or disseminating any of this information or taking any action in reliance on or regarding this information. If you have received this fax in error, please notify us immediately by telephone so that we can arrange for its return to [place]. Phone: [phone], Toll-Free: [phone], Fax: [phone] Page: 2 of 2 Call Id: [id number] Caller Understands Yes PreDisposition Call Doctor Care Advice Given Per Guideline SEE PCP WITHIN 24 HOURS: * IF OFFICE WILL BE OPEN: You need to be examined within the next 24 hours. Call your doctor (or NP/PA) when the office opens and make an appointment. CALL BACK IF: * Breathing difficulty or chest pain occurs * You become worse CARE ADVICE given per Leg Swelling and Edema (Adult) guideline. Comments User: [name], RN Date/Time [name] Time): 04/02/2020 8:56:56 AM called office and spoke to receptionist who said there are no appts for today. Pt notified and states he will call back for appt on Monday. wants to know if there is any medication that can be ordered. Please call pt back Referrals REFERRED TO PCP OFFICE GO TO FACILITY REFUSED

## 2020-04-02 NOTE — Telephone Encounter (Signed)
Noted, will evaluate. 

## 2020-04-03 ENCOUNTER — Other Ambulatory Visit: Payer: Self-pay | Admitting: Primary Care

## 2020-04-03 DIAGNOSIS — E785 Hyperlipidemia, unspecified: Secondary | ICD-10-CM

## 2020-04-06 ENCOUNTER — Encounter: Payer: Self-pay | Admitting: Primary Care

## 2020-04-06 ENCOUNTER — Ambulatory Visit (INDEPENDENT_AMBULATORY_CARE_PROVIDER_SITE_OTHER): Payer: No Typology Code available for payment source | Admitting: Primary Care

## 2020-04-06 ENCOUNTER — Other Ambulatory Visit: Payer: Self-pay

## 2020-04-06 VITALS — BP 124/64 | HR 73 | Temp 98.0°F | Ht 70.0 in | Wt 290.0 lb

## 2020-04-06 DIAGNOSIS — M79673 Pain in unspecified foot: Secondary | ICD-10-CM | POA: Diagnosis not present

## 2020-04-06 DIAGNOSIS — I10 Essential (primary) hypertension: Secondary | ICD-10-CM

## 2020-04-06 DIAGNOSIS — G8929 Other chronic pain: Secondary | ICD-10-CM | POA: Insufficient documentation

## 2020-04-06 DIAGNOSIS — R600 Localized edema: Secondary | ICD-10-CM | POA: Diagnosis not present

## 2020-04-06 MED ORDER — HYDROCHLOROTHIAZIDE 12.5 MG PO TABS: 12.5000 mg | ORAL_TABLET | Freq: Every day | ORAL | 0 refills | Status: DC

## 2020-04-06 NOTE — Patient Instructions (Signed)
Stop taking amlodipine 10 mg for blood pressure, this could be contributing to your foot swelling.  Continue taking losartan 100 mg until your bottle is empty, then transition over to olmesartan 20 mg daily for blood pressure.  Start hydrochlorothiazide 12.5 mg once daily for high blood pressure.  You can tweak the dose of your gabapentin by taking 100 mg in the morning, 100 mg in the afternoon, 200 to 300 mg at bedtime.  Please let me know you land on the dosing.  Elevate your legs when resting. Consider compression stockings as discussed.  We will see you next month for your annual physical.  It was a pleasure to see you today!

## 2020-04-06 NOTE — Assessment & Plan Note (Signed)
Bilaterally, plantar location mostly  Discussed for him to tweak his gabapentin medication, take 100 mg in the morning, 100 mg in the afternoon if needed, 300 mg in the evening.  He will update in a few weeks to let me know where he lands in the dosing.  He will need a refill at that time

## 2020-04-06 NOTE — Assessment & Plan Note (Signed)
Chronic, does appear worse today, trace pitting noted.  Fortunately BNP was -6 weeks ago. Differentials include obesity, diabetes, peripheral vascular disease, side effects from amlodipine.  Recommended compression stockings. Discontinue amlodipine 10 mg, switch to HCTZ 12.5 mg.  Elevate extremities at rest.  I offered labs today, he currently declines. We will see him back next month for his CPE.

## 2020-04-06 NOTE — Progress Notes (Signed)
Subjective:    Patient ID: Seth Larsen, male    DOB: 12/03/1960, 60 y.o.   MRN: [id number]  HPI  This visit occurred during the SARS-CoV-2 public health emergency.  Safety protocols were in place, including screening questions prior to the visit, additional usage of staff PPE, and extensive cleaning of exam room while observing appropriate contact time as indicated for disinfecting solutions.   Seth Larsen is a 60 year old male with a history of hypertension, type 2 diabetes, rheumatoid arthritis, hyperlipidemia who presents today with a chief complaint of extremity swelling and several issues.  Over the last month he's noticed "more than average swelling" to the bilateral lower extremities from the knees to his feet. He is managed on amlodipine 10 mg. His swelling waxes and wanes, today he's noticed improvement. He is active at work, walks all over the hospital, also [place]. Swelling is worse throughout the day, improved in the morning. He endorses weight gain.  He denies chest pain, increased exertional dyspnea.  He was evaluated in the ED in January 2020, BNP unremarkable.  He's also continued to notice bilateral plantar foot tingling/numbness, callus development. He is managed on gabapentin 100 mg for which he's taken 200 mg to 300 mg HS. He thinks this has been somewhat effective, but continues to notice symptoms during the day.  He's also noticed thinning skin to the upper and lower extremities with easy injury and bleeding. He has noticed more dry skin lately, has been using [employer] Diabetic lotion with improvement to skin.   BP Readings from Last 3 Encounters:  04/06/20 124/64  03/04/20 140/76  02/20/20 135/79   Wt Readings from Last 3 Encounters:  04/06/20 290 lb (131.5 kg)  03/04/20 290 lb (131.5 kg)  02/20/20 270 lb (122.5 kg)     Review of Systems  Constitutional: Negative for fever.  Respiratory: Negative for shortness of breath.   Cardiovascular:  Positive for leg swelling. Negative for chest pain.  Skin: Negative for color change.       Past Medical History:  Diagnosis Date  . Arthritis   . Hypertension   . Type 2 diabetes mellitus (HCC)      Social History   Socioeconomic History  . Marital status: Married    Spouse name: Not on file  . Number of children: Not on file  . Years of education: Not on file  . Highest education level: Not on file  Occupational History  . Not on file  Tobacco Use  . Smoking status: Never Smoker  . Smokeless tobacco: Current User    Types: Chew  Substance and Sexual Activity  . Alcohol use: No    Alcohol/week: 0.0 standard drinks  . Drug use: No  . Sexual activity: Not on file  Other Topics Concern  . Not on file  Social History Narrative   Married.   Works at numerous occupations.    Enjoys tending to his animals.    Social Determinants of Health   Financial Resource Strain: Not on file  Food Insecurity: Not on file  Transportation Needs: Not on file  Physical Activity: Not on file  Stress: Not on file  Social Connections: Not on file  Intimate Partner Violence: Not on file    Past Surgical History:  Procedure Laterality Date  . BACK SURGERY    . CARPAL TUNNEL RELEASE    . KNEE SURGERY    . TOTAL KNEE ARTHROPLASTY  Family History  Problem Relation Age of Onset  . Hypertension Father   . Alzheimer's disease Father   . Diabetes Brother   . Hypertension Mother   . Breast cancer Mother   . Breast cancer Sister     Allergies  Allergen Reactions  . Lisinopril Other (See Comments)    Myalgias, urinary frequency    Current Outpatient Medications on File Prior to Visit  Medication Sig Dispense Refill  . acetaminophen (TYLENOL) 500 MG tablet Take 500 mg by mouth every 6 (six) hours as needed.    Marland Kitchen atorvastatin (LIPITOR) 10 MG tablet TAKE 1 TABLET BY MOUTH EVERY DAY IN THE EVENING 90 tablet 0  . DULoxetine (CYMBALTA) 60 MG capsule Take 60 mg by mouth daily.     . finasteride (PROSCAR) 5 MG tablet TAKE 1 TABLET BY MOUTH DAILY FOR URINARY FREQUENCY. 30 tablet 0  . folic acid (FOLVITE) 1 MG tablet Take 1 mg by mouth daily.    Marland Kitchen gabapentin (NEURONTIN) 100 MG capsule TAKE 1-2 CAPSULES BY MOUTH TWICE DAILY FOR PAIN. 270 capsule 0  . HUMIRA PEN 40 MG/0.4ML PNKT Inject 40 mLs as directed every 7 (seven) weeks.     . metFORMIN (GLUCOPHAGE) 500 MG tablet Take 1 tablet (500 mg total) by mouth 2 (two) times daily with a meal. For diabetes. 180 tablet 3  . methotrexate (50 MG/ML) 1 g injection Inject 8 mg into the vein every 7 (seven) days.    Marland Kitchen olmesartan (BENICAR) 20 MG tablet Take 1 tablet (20 mg total) by mouth daily. For blood pressure. 90 tablet 0  . tamsulosin (FLOMAX) 0.4 MG CAPS capsule TAKE 1 CAPSULE BY MOUTH EVERY EVENING FOR FREQUENT URINATION. 90 capsule 1   No current facility-administered medications on file prior to visit.    BP 124/64   Pulse 73   Temp 98 F (36.7 C)   Ht 5\' 10"  (1.778 m)   Wt 290 lb (131.5 kg)   SpO2 94%   BMI 41.61 kg/m    Objective:   Physical Exam Constitutional:      Appearance: He is well-nourished.  Cardiovascular:     Rate and Rhythm: Normal rate and regular rhythm.     Comments: Bilateral lower extremity edema noted, trace pitting more so to right than left.  Indentation to skin noted at sock line Pulmonary:     Effort: Pulmonary effort is normal.     Breath sounds: Normal breath sounds.  Musculoskeletal:     Cervical back: Neck supple.  Skin:    General: Skin is warm and dry.  Psychiatric:        Mood and Affect: Mood and affect normal.            Assessment & Plan:

## 2020-04-21 ENCOUNTER — Telehealth: Payer: Self-pay

## 2020-04-21 DIAGNOSIS — G8929 Other chronic pain: Secondary | ICD-10-CM

## 2020-04-21 DIAGNOSIS — M79673 Pain in unspecified foot: Secondary | ICD-10-CM

## 2020-04-21 NOTE — Telephone Encounter (Signed)
Patient was seen a few weeks ago and was told if the gabapentin (NEURONTIN) 100 MG capsule does not help that he could go to 300 MG. Wondering if he can go ahead and up the MG.

## 2020-04-22 NOTE — Telephone Encounter (Signed)
Please advise 

## 2020-04-22 NOTE — Telephone Encounter (Signed)
Is he still taking the 100 mg in the AM and afternoon? And then 300 mg HS? Where did he land with his medication dosing?

## 2020-04-23 MED ORDER — GABAPENTIN 300 MG PO CAPS: 300.0000 mg | ORAL_CAPSULE | Freq: Three times a day (TID) | ORAL | 0 refills | Status: DC

## 2020-04-23 NOTE — Addendum Note (Signed)
Addended by: Doreene Nest on: 04/23/2020 02:03 PM   Modules accepted: Orders

## 2020-04-23 NOTE — Telephone Encounter (Signed)
Noted, will change him to gabapentin 300 mg capsules, have him take 300 mg TID. Have him update me in 2 weeks. New Rx sent to pharmacy.

## 2020-04-23 NOTE — Telephone Encounter (Signed)
Called patient he is taking 100mg  in morning and 300mg  at night. Not seeing any improvement with symptoms. Wanted to know if he can increase and what he can go up to.

## 2020-04-23 NOTE — Telephone Encounter (Signed)
Called patient reviewed all information and repeated back to me. Will call if any questions.  ? ?

## 2020-05-12 ENCOUNTER — Ambulatory Visit: Payer: No Typology Code available for payment source | Admitting: Primary Care

## 2020-05-13 ENCOUNTER — Encounter: Payer: Self-pay | Admitting: Primary Care

## 2020-05-13 NOTE — Patient Instructions (Addendum)
Start prednisone 20 mg tablets. Take 3 tablets for 2 days, then 2 tablets for 3 days, then 1 tablet for 3 days.   You can adjust your gabapentin dose to one extra capsule daily, update me on this.  Double check on your duloxetine (Cymbalta) medication.  You can take a max of 3000 mg of Tylenol in 24 hours.  Stop by the lab prior to leaving today. I will notify you of your results once received.   Please schedule a follow up appointment in 6 months for diabetes check.   It was a pleasure to see you today!   I have made you an appointment at [facility] care on 06/30/2020 for 8:15 am. If this time does not work for you give there office a call.   [facility]:  [address] [place], [place] [address] Phone: [phone] Fax: [phone]

## 2020-05-14 ENCOUNTER — Ambulatory Visit (INDEPENDENT_AMBULATORY_CARE_PROVIDER_SITE_OTHER): Payer: No Typology Code available for payment source | Admitting: Primary Care

## 2020-05-14 ENCOUNTER — Encounter: Payer: Self-pay | Admitting: Primary Care

## 2020-05-14 ENCOUNTER — Other Ambulatory Visit: Payer: Self-pay

## 2020-05-14 VITALS — BP 136/74 | HR 82 | Temp 98.2°F | Ht 70.0 in | Wt 288.0 lb

## 2020-05-14 DIAGNOSIS — E785 Hyperlipidemia, unspecified: Secondary | ICD-10-CM | POA: Diagnosis not present

## 2020-05-14 DIAGNOSIS — Z0000 Encounter for general adult medical examination without abnormal findings: Secondary | ICD-10-CM | POA: Diagnosis not present

## 2020-05-14 DIAGNOSIS — R351 Nocturia: Secondary | ICD-10-CM

## 2020-05-14 DIAGNOSIS — G8929 Other chronic pain: Secondary | ICD-10-CM | POA: Diagnosis not present

## 2020-05-14 DIAGNOSIS — M549 Dorsalgia, unspecified: Secondary | ICD-10-CM | POA: Diagnosis not present

## 2020-05-14 DIAGNOSIS — I10 Essential (primary) hypertension: Secondary | ICD-10-CM | POA: Diagnosis not present

## 2020-05-14 DIAGNOSIS — M069 Rheumatoid arthritis, unspecified: Secondary | ICD-10-CM

## 2020-05-14 DIAGNOSIS — G4733 Obstructive sleep apnea (adult) (pediatric): Secondary | ICD-10-CM

## 2020-05-14 DIAGNOSIS — Z125 Encounter for screening for malignant neoplasm of prostate: Secondary | ICD-10-CM

## 2020-05-14 DIAGNOSIS — E119 Type 2 diabetes mellitus without complications: Secondary | ICD-10-CM

## 2020-05-14 LAB — CBC
HCT: 44.4 % (ref 39.0–52.0)
Hemoglobin: 14.7 g/dL (ref 13.0–17.0)
MCHC: 33.1 g/dL (ref 30.0–36.0)
MCV: 91.3 fl (ref 78.0–100.0)
Platelets: 205 10*3/uL (ref 150.0–400.0)
RBC: 4.87 Mil/uL (ref 4.22–5.81)
RDW: 14.8 % (ref 11.5–15.5)
WBC: 8.8 10*3/uL (ref 4.0–10.5)

## 2020-05-14 LAB — POCT GLYCOSYLATED HEMOGLOBIN (HGB A1C): Hemoglobin A1C: 7.1 % — AB (ref 4.0–5.6)

## 2020-05-14 LAB — LIPID PANEL
Cholesterol: 142 mg/dL (ref 0–200)
HDL: 47.5 mg/dL (ref >39.00)
LDL Cholesterol: 63 mg/dL (ref 0–99)
NonHDL: 94.29
Total CHOL/HDL Ratio: 3
Triglycerides: 157 mg/dL — ABNORMAL HIGH (ref 0.0–149.0)
VLDL: 31.4 mg/dL (ref 0.0–40.0)

## 2020-05-14 LAB — COMPREHENSIVE METABOLIC PANEL
ALT: 29 U/L (ref 0–53)
AST: 19 U/L (ref 0–37)
Albumin: 4.1 g/dL (ref 3.5–5.2)
Alkaline Phosphatase: 30 U/L — ABNORMAL LOW (ref 39–117)
BUN: 15 mg/dL (ref 6–23)
CO2: 32 mEq/L (ref 19–32)
Calcium: 9.6 mg/dL (ref 8.4–10.5)
Chloride: 99 mEq/L (ref 96–112)
Creatinine, Ser: 0.79 mg/dL (ref 0.40–1.50)
GFR: 95.81 mL/min (ref >60.00)
Glucose, Bld: 155 mg/dL — ABNORMAL HIGH (ref 70–99)
Potassium: 4.4 mEq/L (ref 3.5–5.1)
Sodium: 136 mEq/L (ref 135–145)
Total Bilirubin: 0.4 mg/dL (ref 0.2–1.2)
Total Protein: 6.8 g/dL (ref 6.0–8.3)

## 2020-05-14 LAB — PSA: PSA: 0.52 ng/mL (ref 0.10–4.00)

## 2020-05-14 MED ORDER — METHYLPREDNISOLONE ACETATE 80 MG/ML IJ SUSP
80.0000 mg | Freq: Once | INTRAMUSCULAR | Status: AC
  Administered 2020-05-14: 80 mg via INTRAMUSCULAR

## 2020-05-14 MED ORDER — PREDNISONE 20 MG PO TABS: ORAL_TABLET | ORAL | 0 refills | Status: DC

## 2020-05-14 NOTE — Assessment & Plan Note (Signed)
Compliant to atorvastatin 10 mg daily, continue same.  Repeat lipid panel pending.

## 2020-05-14 NOTE — Assessment & Plan Note (Signed)
Immunizations UTD. Colon cancer screening UTD, due in 2024. PSA due and pending.  Discussed the importance of a healthy diet and regular exercise in order for weight loss, and to reduce the risk of any potential medical problems.  Exam today stable. Labs pending.

## 2020-05-14 NOTE — Progress Notes (Signed)
Subjective:    Patient ID: Seth Larsen, male    DOB: [birth year], 60 y.o.   MRN: [id number]  HPI  Seth Larsen is a very pleasant 60 y.o. male who presents today for complete physical. He would also like to discuss acute on chronic back pain.   Acute on chronic back pain to right side. Pain now is to the right buttocks with radiation down to right hip, lower extremity. Long history of chronic back pain, has not had treatment in years, history of ruptured discs. He's been taking Tylenol Arthritis without much relief. He's recently been taking CBD gummies, hasn't noticed a significant difference. Gabapentin dose increase from last visit hasn't helped.    Immunizations: -Tetanus: 2017 -Influenza: Completed this season  -Covid-19: Three vaccines -Shingles: Completed series -Pneumonia: 2017   Diet: He endorses a fair diet.  Exercise: He is active at work.  Eye exam: Due, he will schedule.  Dental exam: Completes semi-annually   Colonoscopy: Cologuard negative in 2021, due in 2024 PSA: Due  BP Readings from Last 3 Encounters:  05/14/20 136/74  04/06/20 124/64  03/04/20 140/76        Review of Systems  Constitutional: Negative for unexpected weight change.  HENT: Negative for rhinorrhea.   Eyes: Negative for visual disturbance.  Respiratory: Negative for cough and shortness of breath.   Cardiovascular: Negative for chest pain.  Gastrointestinal: Negative for constipation and diarrhea.  Genitourinary: Negative for difficulty urinating.  Musculoskeletal: Positive for arthralgias and back pain.  Skin: Negative for rash.  Allergic/Immunologic: Negative for environmental allergies.  Neurological: Negative for dizziness and headaches.  Psychiatric/Behavioral: The patient is not nervous/anxious.          Past Medical History:  Diagnosis Date  . Arthritis   . Hypertension   . Type 2 diabetes mellitus (HCC)     Social History   Socioeconomic History  . Marital  status: Married    Spouse name: Not on file  . Number of children: Not on file  . Years of education: Not on file  . Highest education level: Not on file  Occupational History  . Not on file  Tobacco Use  . Smoking status: Never Smoker  . Smokeless tobacco: Current User    Types: Chew  Substance and Sexual Activity  . Alcohol use: No    Alcohol/week: 0.0 standard drinks  . Drug use: No  . Sexual activity: Not on file  Other Topics Concern  . Not on file  Social History Narrative   Married.   Works at numerous occupations.    Enjoys tending to his animals.    Social Determinants of Health   Financial Resource Strain: Not on file  Food Insecurity: Not on file  Transportation Needs: Not on file  Physical Activity: Not on file  Stress: Not on file  Social Connections: Not on file  Intimate Partner Violence: Not on file    Past Surgical History:  Procedure Laterality Date  . BACK SURGERY    . CARPAL TUNNEL RELEASE    . KNEE SURGERY    . TOTAL KNEE ARTHROPLASTY      Family History  Problem Relation Age of Onset  . Hypertension Father   . Alzheimer's disease Father   . Diabetes Brother   . Hypertension Mother   . Breast cancer Mother   . Breast cancer Sister     Allergies  Allergen Reactions  . Lisinopril Other (See Comments)    Myalgias, urinary frequency  Current Outpatient Medications on File Prior to Visit  Medication Sig Dispense Refill  . acetaminophen (TYLENOL) 500 MG tablet Take 500 mg by mouth every 6 (six) hours as needed.    Marland Kitchen atorvastatin (LIPITOR) 10 MG tablet TAKE 1 TABLET BY MOUTH EVERY DAY IN THE EVENING 90 tablet 0  . DULoxetine (CYMBALTA) 60 MG capsule Take 60 mg by mouth daily.    . finasteride (PROSCAR) 5 MG tablet TAKE 1 TABLET BY MOUTH DAILY FOR URINARY FREQUENCY. 30 tablet 0  . folic acid (FOLVITE) 1 MG tablet Take 1 mg by mouth daily.    Marland Kitchen gabapentin (NEURONTIN) 300 MG capsule Take 1 capsule (300 mg total) by mouth 3 (three) times  daily. For pain. 90 capsule 0  . HUMIRA PEN 40 MG/0.4ML PNKT Inject 40 mLs as directed every 7 (seven) weeks.     . hydrochlorothiazide (HYDRODIURIL) 12.5 MG tablet Take 1 tablet (12.5 mg total) by mouth daily. For blood pressure. 90 tablet 0  . metFORMIN (GLUCOPHAGE) 500 MG tablet Take 1 tablet (500 mg total) by mouth 2 (two) times daily with a meal. For diabetes. 180 tablet 3  . methotrexate (50 MG/ML) 1 g injection Inject 8 mg into the vein every 7 (seven) days.    Marland Kitchen olmesartan (BENICAR) 20 MG tablet Take 1 tablet (20 mg total) by mouth daily. For blood pressure. 90 tablet 0  . tamsulosin (FLOMAX) 0.4 MG CAPS capsule TAKE 1 CAPSULE BY MOUTH EVERY EVENING FOR FREQUENT URINATION. 90 capsule 1   No current facility-administered medications on file prior to visit.    BP 136/74   Pulse 82   Temp 98.2 F (36.8 C) (Temporal)   Ht 5\' 10"  (1.778 m)   Wt 288 lb (130.6 kg)   SpO2 96%   BMI 41.32 kg/m  Objective:   Physical Exam HENT:     Right Ear: Tympanic membrane and ear canal normal.     Left Ear: Tympanic membrane and ear canal normal.     Nose: Nose normal.     Right Sinus: No maxillary sinus tenderness or frontal sinus tenderness.     Left Sinus: No maxillary sinus tenderness or frontal sinus tenderness.  Eyes:     Conjunctiva/sclera: Conjunctivae normal.     Pupils: Pupils are equal, round, and reactive to light.  Neck:     Thyroid: No thyromegaly.     Vascular: No carotid bruit.  Cardiovascular:     Rate and Rhythm: Normal rate and regular rhythm.     Heart sounds: Normal heart sounds.  Pulmonary:     Effort: Pulmonary effort is normal.     Breath sounds: Normal breath sounds. No wheezing or rales.  Abdominal:     General: Bowel sounds are normal.     Palpations: Abdomen is soft.     Tenderness: There is no abdominal tenderness.  Musculoskeletal:     Cervical back: Neck supple.     Lumbar back: Decreased range of motion.       Back:     Comments: Decrease in ROM to  right lower back  Skin:    General: Skin is warm and dry.  Neurological:     Mental Status: He is alert and oriented to person, place, and time.     Cranial Nerves: No cranial nerve deficit.     Deep Tendon Reflexes: Reflexes are normal and symmetric.           Assessment & Plan:  This visit occurred during the SARS-CoV-2 public health emergency.  Safety protocols were in place, including screening questions prior to the visit, additional usage of staff PPE, and extensive cleaning of exam room while observing appropriate contact time as indicated for disinfecting solutions.

## 2020-05-14 NOTE — Assessment & Plan Note (Signed)
Improved recently, has not taken finasteride since June 2021. Continue tamsulosin 0.4 mg. Consider increasing to 0.8 mg if needed.  He will update.

## 2020-05-14 NOTE — Addendum Note (Signed)
Addended by: Donnamarie Poag on: 05/14/2020 09:19 AM   Modules accepted: Orders

## 2020-05-14 NOTE — Assessment & Plan Note (Signed)
Compliant to metformin 500 mg BID, continue same. Managed on statin and ARB. Pneumonia vaccine UTD. Eye exam UTD.  Repeat A1C pending. Follow up in 6 months.

## 2020-05-14 NOTE — Assessment & Plan Note (Signed)
Not using CPAP machine

## 2020-05-14 NOTE — Assessment & Plan Note (Addendum)
Intermittent, more persistent recently. Tylenol Arthritis, Gabapentin, and CBD gummies ineffective.   Discussed options for treatment including PT, neurosurgery, prednisone, increase gabapentin dose. Cannot take  NSAID's due to methotrexate.  He opts for prednisone course which was provided. we also discussed to increase gabapentin dose to 600 mg in AM or afternoon, he will try this and update. IM Depo medrol injection provided today.   Consider PT vs neurosurgery referral, he will update.

## 2020-05-14 NOTE — Assessment & Plan Note (Signed)
Above goal today, last visit was stable. Continue HCTZ 12.5 mg and olmesartan 20 mg.   CMP pending.

## 2020-05-14 NOTE — Assessment & Plan Note (Signed)
Follows with rheumatology. Continue Methotrexate and Humira.

## 2020-05-14 NOTE — Addendum Note (Signed)
Addended by: Doreene Nest on: 05/14/2020 08:20 AM   Modules accepted: Orders

## 2020-05-25 ENCOUNTER — Telehealth: Payer: Self-pay

## 2020-05-25 DIAGNOSIS — M79673 Pain in unspecified foot: Secondary | ICD-10-CM

## 2020-05-25 DIAGNOSIS — G8929 Other chronic pain: Secondary | ICD-10-CM

## 2020-05-25 MED ORDER — GABAPENTIN 600 MG PO TABS: 600.0000 mg | ORAL_TABLET | Freq: Three times a day (TID) | ORAL | 0 refills | Status: DC

## 2020-05-25 NOTE — Telephone Encounter (Signed)
Glad the prednisone helped, but it seems like he may need to get together with his rheumatologist to come up with a long term plan. I don't feel comfortable initiating prednisone long term, especially given his history of diabetes.   I recommend he reach out to rheumatology and explain his pain and how prednisone helped temporarily until the course was complete.

## 2020-05-25 NOTE — Telephone Encounter (Signed)
[facility] Night - Client Nonclinical Telephone Record AccessNurse Client [facility] Night - Client Client Site  [facility] [place] - Night Physician Vernona Rieger - NP Contact Type Call Who Is Calling Patient / Member / Family / Caregiver Caller Name Jamel Holzmann Caller Phone Number [phone] Patient Name [name] Patient DOB 08/16/1960 Call Type Message Only Information Provided Reason for Call Medication Question / Request Initial Comment Caller states he is needing a refill for Gabapentin medication. Additional Comment Caller states he also has some questions regarding the prednisone shot and a medication he was given for his hip/leg pain. Declined triage. Office hours provided Disp. Time Disposition Final User 05/25/2020 8:09:21 AM General Information Provided Yes Darliss Ridgel Call Closed By: Darliss Ridgel Transaction Date/Time: 05/25/2020 8:03:42 AM (ET)

## 2020-05-25 NOTE — Telephone Encounter (Signed)
Requested added to other open phone note

## 2020-05-25 NOTE — Telephone Encounter (Signed)
Patient is calling in stating he is feeling a lot better after finishing the Prednisone, but the pain is slowly coming back. Wondering if there is something he can take to help prevent the pain from coming back as bad as it was.

## 2020-05-25 NOTE — Telephone Encounter (Signed)
  LAST APPOINTMENT DATE: 05/25/2020   NEXT APPOINTMENT DATE:@10 /12/2020  MEDICATION: gabapentin (NEURONTIN) 300 MG capsule  PHARMACY:[id number] - [place], [place] - [address]  Comments: patient states he doesn't think he got very much relief from taking it but is almost out of the medication.   Please advise

## 2020-05-25 NOTE — Telephone Encounter (Signed)
Patient also sent message to request refill on gabapentin.  Currently taking 2 tab tid.  States that it has not help much. The prednisone gave more relief but as soon as it started waring off pain is coming back. Wanted to know if any other options.

## 2020-05-25 NOTE — Telephone Encounter (Signed)
Called patient reviewed all information and repeated back to me. Will call if any questions.     Also wanted a refill of Gabapentin taking 2 tab tid ok to refill?

## 2020-05-25 NOTE — Telephone Encounter (Signed)
Yes, will send in new Rx for gabapentin 600 mg to take TID. One capsule TID, just make sure he doesn't take two capsules TID.

## 2020-05-26 NOTE — Telephone Encounter (Signed)
Called patient reviewed all information and repeated back to me. Will call if any questions.  Will only take one TID

## 2020-06-23 ENCOUNTER — Other Ambulatory Visit: Payer: Self-pay | Admitting: Primary Care

## 2020-06-23 DIAGNOSIS — I10 Essential (primary) hypertension: Secondary | ICD-10-CM

## 2020-06-28 ENCOUNTER — Other Ambulatory Visit: Payer: Self-pay | Admitting: Primary Care

## 2020-06-28 DIAGNOSIS — I10 Essential (primary) hypertension: Secondary | ICD-10-CM

## 2020-06-29 ENCOUNTER — Other Ambulatory Visit: Payer: Self-pay | Admitting: Primary Care

## 2020-06-29 DIAGNOSIS — M79673 Pain in unspecified foot: Secondary | ICD-10-CM

## 2020-06-29 DIAGNOSIS — E785 Hyperlipidemia, unspecified: Secondary | ICD-10-CM

## 2020-06-29 DIAGNOSIS — I10 Essential (primary) hypertension: Secondary | ICD-10-CM

## 2020-06-29 DIAGNOSIS — R351 Nocturia: Secondary | ICD-10-CM

## 2020-06-29 DIAGNOSIS — G8929 Other chronic pain: Secondary | ICD-10-CM

## 2020-06-30 LAB — HM DIABETES EYE EXAM

## 2020-07-08 ENCOUNTER — Other Ambulatory Visit: Payer: Self-pay | Admitting: Primary Care

## 2020-07-08 DIAGNOSIS — I10 Essential (primary) hypertension: Secondary | ICD-10-CM

## 2020-07-14 ENCOUNTER — Encounter: Payer: Self-pay | Admitting: Primary Care

## 2020-08-03 ENCOUNTER — Other Ambulatory Visit: Payer: Self-pay | Admitting: Primary Care

## 2020-08-03 DIAGNOSIS — E119 Type 2 diabetes mellitus without complications: Secondary | ICD-10-CM

## 2020-08-27 ENCOUNTER — Telehealth (INDEPENDENT_AMBULATORY_CARE_PROVIDER_SITE_OTHER): Payer: No Typology Code available for payment source | Admitting: Primary Care

## 2020-08-27 ENCOUNTER — Telehealth: Payer: Self-pay

## 2020-08-27 VITALS — Ht 70.0 in | Wt 288.0 lb

## 2020-08-27 DIAGNOSIS — U071 COVID-19: Secondary | ICD-10-CM

## 2020-08-27 HISTORY — DX: COVID-19: U07.1

## 2020-08-27 MED ORDER — NIRMATRELVIR/RITONAVIR (PAXLOVID)TABLET: 3.0000 | ORAL_TABLET | Freq: Two times a day (BID) | ORAL | 0 refills | Status: AC

## 2020-08-27 NOTE — Patient Instructions (Signed)
Start the [employer] medication. Take 3 capsules by mouth twice daily for five days.  You can take Tylenol and Robitussin/Delsym if needed.  It was a pleasure to see you today!

## 2020-08-27 NOTE — Assessment & Plan Note (Signed)
Symptom onset last night, positive test last night. He will attach picture of test result.  Discussed options for treatment, he qualifies.  Rx for Paxlovid sent to pharmacy. ED/return precautions provided.   He appeared stable via phone visit. Exam limited.

## 2020-08-27 NOTE — Telephone Encounter (Signed)
Patient called and states he tested positive for COVID yesterday afternoon. He started feeling bad sometime yesterday. Patient has extreme fatigue, fever and chills right now. Patient wants to see about treatment options before sx get worse and is worried with all his other health conditions. Please advise.

## 2020-08-27 NOTE — Progress Notes (Signed)
Patient ID: Seth Larsen, male    DOB: 07-12-1960, 60 y.o.   MRN: [id number]  Virtual visit completed through [employer], a video enabled telemedicine application. Due to national recommendations of social distancing due to COVID-19, a virtual visit is felt to be most appropriate for this patient at this time. Reviewed limitations, risks, security and privacy concerns of performing a virtual visit and the availability of in person appointments. I also reviewed that there may be a patient responsible charge related to this service. The patient agreed to proceed.   Patient location: home Provider location: [facility], office Persons participating in this virtual visit: patient, provider   If any vitals were documented, they were collected by patient at home unless specified below.    He could not get connected to the video so we had to conduct our visit via phone which lasted 5 min and 3 sec.  Ht 5\' 10"  (1.778 m)   Wt 288 lb (130.6 kg)   BMI 41.32 kg/m    CC: Covid-19 Positive Subjective:   HPI: Seth Larsen is a 60 y.o. male with a history of type 2 diabetes, OSA, hypertension, rheumatoid arthritis on methotrexate presenting on 08/27/2020 for cough.   Symptoms began yesterday with fatigue, cold chills. He then developed a fever of 101. He has a mild cough. He's taken Tylenol without much improvement. He's concerned that symptoms may progress.   He tested positive last night with a home Covid-19 test. He is interested in treatment for Covid-19 infection. He is managed on methotrexate for RA. He's had three Covid vaccines.       Relevant past medical, surgical, family and social history reviewed and updated as indicated. Interim medical history since our last visit reviewed. Allergies and medications reviewed and updated. Outpatient Medications Prior to Visit  Medication Sig Dispense Refill   acetaminophen (TYLENOL) 500 MG tablet Take 500 mg by mouth every 6 (six)  hours as needed.     atorvastatin (LIPITOR) 10 MG tablet Take 1 tablet (10 mg total) by mouth daily. For cholesterol 90 tablet 3   DULoxetine (CYMBALTA) 60 MG capsule Take 60 mg by mouth daily.     folic acid (FOLVITE) 1 MG tablet Take 1 mg by mouth daily.     gabapentin (NEURONTIN) 600 MG tablet Take 1 tablet (600 mg total) by mouth 3 (three) times daily. For pain. 270 tablet 0   HUMIRA PEN 40 MG/0.4ML PNKT Inject 40 mLs as directed every 7 (seven) weeks.      hydrochlorothiazide (HYDRODIURIL) 12.5 MG tablet TAKE 1 TABLET (12.5 MG TOTAL) BY MOUTH DAILY. FOR BLOOD PRESSURE. 90 tablet 3   metFORMIN (GLUCOPHAGE) 500 MG tablet TAKE 1 TABLET (500 MG TOTAL) BY MOUTH 2 (TWO) TIMES DAILY WITH A MEAL. FOR DIABETES. 180 tablet 3   methotrexate (50 MG/ML) 1 g injection Inject 8 mg into the vein every 7 (seven) days.     olmesartan (BENICAR) 20 MG tablet TAKE 1 TABLET (20 MG TOTAL) BY MOUTH DAILY. FOR BLOOD PRESSURE. 90 tablet 3   tamsulosin (FLOMAX) 0.4 MG CAPS capsule TAKE 1 CAPSULE BY MOUTH EVERY EVENING FOR FREQUENT URINATION. (Patient not taking: Reported on 08/27/2020) 90 capsule 3   predniSONE (DELTASONE) 20 MG tablet Take 3 tablets by mouth once daily for two days, then 2 tablets for three days, then 1 tablet for three days. 15 tablet 0   No facility-administered medications prior to visit.     Per HPI  unless specifically indicated in ROS section below Review of Systems  Constitutional:  Positive for chills, fatigue and fever.  HENT:  Negative for congestion and sore throat.   Respiratory:  Positive for cough. Negative for shortness of breath.   Neurological:  Negative for headaches.  Objective:  Ht 5\' 10"  (1.778 m)   Wt 288 lb (130.6 kg)   BMI 41.32 kg/m   Wt Readings from Last 3 Encounters:  08/27/20 288 lb (130.6 kg)  05/14/20 288 lb (130.6 kg)  04/06/20 290 lb (131.5 kg)       Physical exam: Gen: alert, NAD, not ill appearing Pulm: speaks in complete sentences without increased  work of breathing, no cough. Psych: normal mood, normal thought content      Results for orders placed or performed in visit on 07/14/20  HM DIABETES EYE EXAM  Result Value Ref Range   HM Diabetic Eye Exam No Retinopathy No Retinopathy   Assessment & Plan:   Problem List Items Addressed This Visit       Other   COVID-19 virus infection - Primary    Symptom onset last night, positive test last night. He will attach picture of test result.  Discussed options for treatment, he qualifies.  Rx for Paxlovid sent to pharmacy. ED/return precautions provided.   He appeared stable via phone visit. Exam limited.       Relevant Medications   nirmatrelvir/ritonavir EUA (PAXLOVID) TABS     Meds ordered this encounter  Medications   nirmatrelvir/ritonavir EUA (PAXLOVID) TABS    Sig: Take 3 tablets by mouth 2 (two) times daily for 5 days. Patient GFR is 95    Dispense:  30 tablet    Refill:  0    Order Specific Question:   Supervising Provider    Answer:   BEDSOLE, AMY E [[id number]]   No orders of the defined types were placed in this encounter.   I discussed the assessment and treatment plan with the patient. The patient was provided an opportunity to ask questions and all were answered. The patient agreed with the plan and demonstrated an understanding of the instructions. The patient was advised to call back or seek an in-person evaluation if the symptoms worsen or if the condition fails to improve as anticipated.  Follow up plan:  Start the Paxlovid Covid-19 medication. Take 3 capsules by mouth twice daily for five days.  You can take Tylenol and Robitussin/Delsym if needed.  It was a pleasure to see you today!   09/13/20, NP

## 2020-08-27 NOTE — Telephone Encounter (Signed)
Called patient added on to virtual today. No further action needed.

## 2020-08-27 NOTE — Telephone Encounter (Signed)
Hey, will you notify patient and add him on in the 12:20 pm slot for virtual visit for today?

## 2020-08-31 ENCOUNTER — Telehealth: Payer: Self-pay

## 2020-08-31 NOTE — Telephone Encounter (Signed)
New message    Patient been home x 5 days for positive covid,medication was prescribe. No fever at present time. 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 In order to return to work patient will need a MD note stating okay to return to work .

## 2020-09-01 ENCOUNTER — Encounter: Payer: Self-pay | Admitting: Primary Care

## 2020-09-01 NOTE — Telephone Encounter (Signed)
Glad he is doing better!  He may test positive for Covid for weeks to months. Okay to return to work as long as he is fever free for 24 hours without fever reducing medication. Needs to wear a mask for the next five days.

## 2020-09-01 NOTE — Telephone Encounter (Signed)
Called patient letter has been completed will get picked up. No further action needed at this time.

## 2020-09-01 NOTE — Telephone Encounter (Signed)
Ok to do letter

## 2020-09-13 ENCOUNTER — Other Ambulatory Visit: Payer: Self-pay | Admitting: Primary Care

## 2020-09-13 DIAGNOSIS — M549 Dorsalgia, unspecified: Secondary | ICD-10-CM

## 2020-09-13 DIAGNOSIS — G8929 Other chronic pain: Secondary | ICD-10-CM

## 2020-09-13 DIAGNOSIS — I10 Essential (primary) hypertension: Secondary | ICD-10-CM

## 2020-09-16 ENCOUNTER — Other Ambulatory Visit: Payer: Self-pay

## 2020-09-16 ENCOUNTER — Ambulatory Visit (INDEPENDENT_AMBULATORY_CARE_PROVIDER_SITE_OTHER): Payer: No Typology Code available for payment source | Admitting: Family Medicine

## 2020-09-16 VITALS — BP 126/68 | HR 76 | Temp 98.1°F | Wt 290.5 lb

## 2020-09-16 DIAGNOSIS — M069 Rheumatoid arthritis, unspecified: Secondary | ICD-10-CM

## 2020-09-16 DIAGNOSIS — E11622 Type 2 diabetes mellitus with other skin ulcer: Secondary | ICD-10-CM

## 2020-09-16 DIAGNOSIS — L97309 Non-pressure chronic ulcer of unspecified ankle with unspecified severity: Secondary | ICD-10-CM

## 2020-09-16 DIAGNOSIS — E119 Type 2 diabetes mellitus without complications: Secondary | ICD-10-CM | POA: Diagnosis not present

## 2020-09-16 MED ORDER — CEPHALEXIN 500 MG PO CAPS: 500.0000 mg | ORAL_CAPSULE | Freq: Two times a day (BID) | ORAL | 0 refills | Status: AC

## 2020-09-16 NOTE — Assessment & Plan Note (Signed)
Lab Results  Component Value Date   HGBA1C 7.1 (A) 05/14/2020   Generally good control. High risk for infection. Cont metformin 500 mg bid

## 2020-09-16 NOTE — Assessment & Plan Note (Signed)
Immunosuppressed on methotrexate and humira. Higher risk for infection will treat with abx.

## 2020-09-16 NOTE — Progress Notes (Signed)
Subjective:     Seth Larsen is a 60 y.o. male presenting for Sore (R calf x 1 week. Burned and painful for 3 days )     HPI  #Right sore - started 1 week ago - burning and painful - Thursday-Sunday was worst - has been weeping - doing peroxide and neopsorin w/o improvement - no fever/chills  Has diabetes Wife told him to go  Lab Results  Component Value Date   HGBA1C 7.1 (A) 05/14/2020      Review of Systems   Social History   Tobacco Use  Smoking Status Never  Smokeless Tobacco Current   Types: Chew        Objective:    BP Readings from Last 3 Encounters:  09/16/20 126/68  05/14/20 136/74  04/06/20 124/64   Wt Readings from Last 3 Encounters:  09/16/20 290 lb 8 oz (131.8 kg)  08/27/20 288 lb (130.6 kg)  05/14/20 288 lb (130.6 kg)    BP 126/68   Pulse 76   Temp 98.1 F (36.7 C) (Temporal)   Wt 290 lb 8 oz (131.8 kg)   SpO2 95%   BMI 41.68 kg/m    Physical Exam Constitutional:      Appearance: Normal appearance. He is not ill-appearing or diaphoretic.  HENT:     Right Ear: External ear normal.     Left Ear: External ear normal.  Eyes:     General: No scleral icterus.    Extraocular Movements: Extraocular movements intact.     Conjunctiva/sclera: Conjunctivae normal.  Cardiovascular:     Rate and Rhythm: Normal rate.  Pulmonary:     Effort: Pulmonary effort is normal.  Musculoskeletal:     Cervical back: Neck supple.     Right lower leg: Edema (1+ to mid calf) present.     Left lower leg: Edema (1+ to mid calf) present.  Skin:    General: Skin is warm and dry.     Comments: Right LE - just above the sock line is a 1 cm ulcer mild in depth. Erythema and ttp along the lower and upper portion of the ulcer.   Neurological:     Mental Status: He is alert. Mental status is at baseline.  Psychiatric:        Mood and Affect: Mood normal.          Assessment & Plan:   Problem List Items Addressed This Visit        Endocrine   Type 2 diabetes mellitus without complication, without long-term current use of insulin (HCC)    Lab Results  Component Value Date   HGBA1C 7.1 (A) 05/14/2020  Generally good control. High risk for infection. 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 Discussed w/ no warmth may not be infected, however, given DM and RA will treat with antibiotics if no improvement and not worsening will plan for wound care clinic next week.       Relevant Medications   cephALEXin (KEFLEX) 500 MG capsule     Musculoskeletal and Integument   Rheumatoid arthritis (HCC)    Immunosuppressed on methotrexate and humira. Higher risk for infection will treat with abx.         Return if symptoms worsen or fail to improve.  Lynnda Child, MD  This visit occurred during the SARS-CoV-2 public health emergency.  Safety protocols were in place, including screening questions prior to the visit, additional usage of staff PPE, and extensive cleaning of exam room while observing appropriate contact time as indicated for disinfecting solutions.

## 2020-09-16 NOTE — Assessment & Plan Note (Signed)
Mild ulcer with ttp and faint erythema surrounding. Discussed w/ no warmth may not be infected, however, given DM and RA will treat with antibiotics if no improvement and not worsening will plan for wound care clinic next week.

## 2020-09-16 NOTE — Patient Instructions (Signed)
#  Ulcer - take the antibiotic - if worsening redness, fevers, chills, call - if not improving by Monday - call and will plan for wound clinic referral

## 2020-11-16 ENCOUNTER — Encounter: Payer: Self-pay | Admitting: Primary Care

## 2020-11-16 ENCOUNTER — Other Ambulatory Visit: Payer: Self-pay

## 2020-11-16 ENCOUNTER — Ambulatory Visit (INDEPENDENT_AMBULATORY_CARE_PROVIDER_SITE_OTHER): Payer: No Typology Code available for payment source | Admitting: Primary Care

## 2020-11-16 VITALS — BP 118/68 | HR 88 | Temp 97.4°F | Ht 70.0 in | Wt 268.0 lb

## 2020-11-16 DIAGNOSIS — G5602 Carpal tunnel syndrome, left upper limb: Secondary | ICD-10-CM

## 2020-11-16 DIAGNOSIS — E119 Type 2 diabetes mellitus without complications: Secondary | ICD-10-CM

## 2020-11-16 DIAGNOSIS — Z23 Encounter for immunization: Secondary | ICD-10-CM | POA: Diagnosis not present

## 2020-11-16 LAB — POCT GLYCOSYLATED HEMOGLOBIN (HGB A1C): Hemoglobin A1C: 7 % — AB (ref 4.0–5.6)

## 2020-11-16 NOTE — Assessment & Plan Note (Signed)
Referral placed for [facility] in [place].

## 2020-11-16 NOTE — Patient Instructions (Addendum)
You will be contacted regarding your referral to orthopedics for carpal tunnel.  Please let us know if you have not been contacted within two weeks.   We will see you in 6 months for your physical exam.   It was a pleasure to see you today!

## 2020-11-16 NOTE — Progress Notes (Signed)
Subjective:    Patient ID: Seth Larsen, male    DOB: [birth year], 60 y.o.   MRN: [id number]  HPI  Seth Larsen is a very pleasant 60 y.o. male with a history of hypertension, OSA, type 2 diabetes, hyperlipidemia who presents today for follow up of diabetes. He would also like a referral for carpal tunnel evaluation to left hand.   1) Type 2 Diabetes:  Current medications include: Metformin 500 mg BID   Last A1C: 7.1 in April 2022, 7.0 today Last Eye Exam: UTD Last Foot Exam: Due today Pneumonia Vaccination: UTD Urine Microalbumin: None. On ARB. Statin: atorvastatin   Dietary changes since last visit: None.    Exercise: No regular exercise, active at work.  BP Readings from Last 3 Encounters:  11/16/20 118/68  09/16/20 126/68  05/14/20 136/74   2) Carpal Tunnel: Chronic to bilateral hands, history of carpal tunnel release to right hand about 10 years ago. Left hand has gradually increased over the years, worse now. Daily numbness/tingling and pain to left wrist and fingers. He often drops objects with his left hand.   He is ready for evaluation.     Review of Systems  Eyes:  Negative for visual disturbance.  Respiratory:  Negative for shortness of breath.   Cardiovascular:  Negative for chest pain.  Neurological:  Positive for numbness. Negative for dizziness.        Past Medical History:  Diagnosis Date   Arthritis    Hypertension    Type 2 diabetes mellitus (HCC)    URI (upper respiratory infection) 03/04/2020    Social History   Socioeconomic History   Marital status: Married    Spouse name: Not on file   Number of children: Not on file   Years of education: Not on file   Highest education level: Not on file  Occupational History   Not on file  Tobacco Use   Smoking status: Never   Smokeless tobacco: Current    Types: Chew  Substance and Sexual Activity   Alcohol use: No    Alcohol/week: 0.0 standard drinks   Drug use: No   Sexual  activity: Not on file  Other Topics Concern   Not on file  Social History Narrative   Married.   Works at numerous occupations.    Enjoys tending to his animals.    Social Determinants of Health   Financial Resource Strain: Not on file  Food Insecurity: Not on file  Transportation Needs: Not on file  Physical Activity: Not on file  Stress: Not on file  Social Connections: Not on file  Intimate Partner Violence: Not on file    Past Surgical History:  Procedure Laterality Date   BACK SURGERY     CARPAL TUNNEL RELEASE     KNEE SURGERY     TOTAL KNEE ARTHROPLASTY      Family History  Problem Relation Age of Onset   Hypertension Father    Alzheimer's disease Father    Diabetes Brother    Hypertension Mother    Breast cancer Mother    Breast cancer Sister     Allergies  Allergen Reactions   Lisinopril Other (See Comments)    Myalgias, urinary frequency    Current Outpatient Medications on File Prior to Visit  Medication Sig Dispense Refill   acetaminophen (TYLENOL) 500 MG tablet Take 500 mg by mouth every 6 (six) hours as needed.     atorvastatin (LIPITOR) 10 MG tablet Take 1  tablet (10 mg total) by mouth daily. For cholesterol 90 tablet 3   DULoxetine (CYMBALTA) 60 MG capsule Take 60 mg by mouth daily.     folic acid (FOLVITE) 1 MG tablet Take 1 mg by mouth daily.     gabapentin (NEURONTIN) 600 MG tablet TAKE 1 TABLET (600 MG TOTAL) BY MOUTH 3 (THREE) TIMES DAILY. FOR PAIN. 270 tablet 0   HUMIRA PEN 40 MG/0.4ML PNKT Inject 40 mLs as directed every 7 (seven) weeks.      hydrochlorothiazide (HYDRODIURIL) 12.5 MG tablet TAKE 1 TABLET (12.5 MG TOTAL) BY MOUTH DAILY. FOR BLOOD PRESSURE. 90 tablet 3   metFORMIN (GLUCOPHAGE) 500 MG tablet TAKE 1 TABLET (500 MG TOTAL) BY MOUTH 2 (TWO) TIMES DAILY WITH A MEAL. FOR DIABETES. 180 tablet 3   methotrexate (50 MG/ML) 1 g injection Inject 8 mg into the vein every 7 (seven) days.     olmesartan (BENICAR) 20 MG tablet TAKE 1 TABLET  (20 MG TOTAL) BY MOUTH DAILY. FOR BLOOD PRESSURE. 90 tablet 3   tamsulosin (FLOMAX) 0.4 MG CAPS capsule TAKE 1 CAPSULE BY MOUTH EVERY EVENING FOR FREQUENT URINATION. 90 capsule 3   No current facility-administered medications on file prior to visit.    BP 118/68   Pulse 88   Temp (!) 97.4 F (36.3 C) (Temporal)   Ht 5\' 10"  (1.778 m)   Wt 268 lb (121.6 kg)   SpO2 97%   BMI 38.45 kg/m  Objective:   Physical Exam Cardiovascular:     Rate and Rhythm: Normal rate and regular rhythm.  Pulmonary:     Effort: Pulmonary effort is normal.     Breath sounds: Normal breath sounds. No wheezing or rales.  Musculoskeletal:     Left wrist: Normal range of motion. Normal pulse.     Cervical back: Neck supple.  Skin:    General: Skin is warm and dry.  Neurological:     Mental Status: He is alert and oriented to person, place, and time.          Assessment & Plan:      This visit occurred during the SARS-CoV-2 public health emergency.  Safety protocols were in place, including screening questions prior to the visit, additional usage of staff PPE, and extensive cleaning of exam room while observing appropriate contact time as indicated for disinfecting solutions.

## 2020-11-16 NOTE — Assessment & Plan Note (Signed)
A1C today of 7.0, controlled.  Continue Metformin XR 500 mg daily.  Foot exam today. Pneumonia vaccine UTD. Eye exam UTD. Managed on statin and ARB.  Follow up in 6 months.

## 2020-11-30 DIAGNOSIS — M79642 Pain in left hand: Secondary | ICD-10-CM | POA: Insufficient documentation

## 2020-12-12 ENCOUNTER — Other Ambulatory Visit: Payer: Self-pay | Admitting: Primary Care

## 2020-12-12 DIAGNOSIS — G8929 Other chronic pain: Secondary | ICD-10-CM

## 2020-12-27 DIAGNOSIS — G8918 Other acute postprocedural pain: Secondary | ICD-10-CM | POA: Insufficient documentation

## 2021-03-30 ENCOUNTER — Other Ambulatory Visit: Payer: Self-pay | Admitting: Primary Care

## 2021-03-30 DIAGNOSIS — I10 Essential (primary) hypertension: Secondary | ICD-10-CM

## 2021-04-08 ENCOUNTER — Other Ambulatory Visit: Payer: Self-pay | Admitting: Primary Care

## 2021-04-08 DIAGNOSIS — I10 Essential (primary) hypertension: Secondary | ICD-10-CM

## 2021-04-12 ENCOUNTER — Other Ambulatory Visit: Payer: Self-pay | Admitting: Primary Care

## 2021-04-12 DIAGNOSIS — I10 Essential (primary) hypertension: Secondary | ICD-10-CM

## 2021-04-12 NOTE — Telephone Encounter (Signed)
Pt called to follow up

## 2021-04-12 NOTE — Telephone Encounter (Signed)
Patient should have 1 refill remaining on his prescription.  ?1 year supply provided in late May 2022, has he called his pharmacy to request the refill? ?

## 2021-04-12 NOTE — Telephone Encounter (Signed)
I have called patient he states he called pharmacy and was told that there are no more refills.  ?

## 2021-04-12 NOTE — Telephone Encounter (Signed)
Will you please call [facility] to verify? Something isn't adding up. ?Did he recently pick up a 90 day supply and is requesting future refills? He should have plenty for now. ?

## 2021-04-12 NOTE — Telephone Encounter (Signed)
Called pharmacy states that he picked up last refill on 03/28/21. Called patient back states he does not think he picked up. He will check at home and reach back out to me in the am and let me know. We will decide what the next steps are from there. He is out of medications.  ?

## 2021-04-13 NOTE — Telephone Encounter (Signed)
Pt called back to say he has looked everywhere at home, car, and work and cannot find the rx. Asking what he needs to do as he is out of the olmesartan. He then stated it was a 10mg  bottle of olmesartan. I advised him that he was changed to olmesartan 20mg  at an OV 04/2020. I suggested he call the pharmacy. ?

## 2021-04-13 NOTE — Telephone Encounter (Signed)
Spoke to pt again as we were getting disconnected. He never asked for an olmesartan refill. It was amlodipine. I advised him he was to stop that in March 2022 due to swelling. He asked why would the pharmacy keep filling it and I advised it was probably on automatic refill and he will need to contact the pharmacy to stop that. ?

## 2021-04-14 NOTE — Telephone Encounter (Signed)
Noted. ? ?During his office visit in October 2022 was he still taking the amlodipine? ? ?Have him remain off of amlodipine for now, continue olmesartan and hydrochlorothiazide for blood pressure. ? ?We will see him next month for follow-up.  Have him notify me if he starts to notice escalations in his blood pressure readings. ?

## 2021-04-15 NOTE — Telephone Encounter (Signed)
Patient was taking amlodipine at October visit. He will let us know if any escalation in readings.  ?

## 2021-05-18 ENCOUNTER — Ambulatory Visit (INDEPENDENT_AMBULATORY_CARE_PROVIDER_SITE_OTHER): Payer: No Typology Code available for payment source | Admitting: Primary Care

## 2021-05-18 ENCOUNTER — Encounter: Payer: Self-pay | Admitting: Primary Care

## 2021-05-18 VITALS — BP 130/68 | HR 86 | Temp 97.3°F | Resp 14 | Ht 67.5 in | Wt 282.2 lb

## 2021-05-18 DIAGNOSIS — Z0000 Encounter for general adult medical examination without abnormal findings: Secondary | ICD-10-CM | POA: Diagnosis not present

## 2021-05-18 DIAGNOSIS — E1165 Type 2 diabetes mellitus with hyperglycemia: Secondary | ICD-10-CM

## 2021-05-18 DIAGNOSIS — M069 Rheumatoid arthritis, unspecified: Secondary | ICD-10-CM | POA: Diagnosis not present

## 2021-05-18 DIAGNOSIS — G4733 Obstructive sleep apnea (adult) (pediatric): Secondary | ICD-10-CM | POA: Diagnosis not present

## 2021-05-18 DIAGNOSIS — R351 Nocturia: Secondary | ICD-10-CM

## 2021-05-18 DIAGNOSIS — Z125 Encounter for screening for malignant neoplasm of prostate: Secondary | ICD-10-CM | POA: Diagnosis not present

## 2021-05-18 DIAGNOSIS — M549 Dorsalgia, unspecified: Secondary | ICD-10-CM

## 2021-05-18 DIAGNOSIS — E785 Hyperlipidemia, unspecified: Secondary | ICD-10-CM | POA: Diagnosis not present

## 2021-05-18 DIAGNOSIS — G8929 Other chronic pain: Secondary | ICD-10-CM

## 2021-05-18 LAB — COMPREHENSIVE METABOLIC PANEL
ALT: 23 U/L (ref 0–53)
AST: 15 U/L (ref 0–37)
Albumin: 4.1 g/dL (ref 3.5–5.2)
Alkaline Phosphatase: 29 U/L — ABNORMAL LOW (ref 39–117)
BUN: 22 mg/dL (ref 6–23)
CO2: 28 mEq/L (ref 19–32)
Calcium: 9 mg/dL (ref 8.4–10.5)
Chloride: 100 mEq/L (ref 96–112)
Creatinine, Ser: 0.89 mg/dL (ref 0.40–1.50)
GFR: 91.77 mL/min (ref >60.00)
Glucose, Bld: 171 mg/dL — ABNORMAL HIGH (ref 70–99)
Potassium: 4.3 mEq/L (ref 3.5–5.1)
Sodium: 136 mEq/L (ref 135–145)
Total Bilirubin: 0.3 mg/dL (ref 0.2–1.2)
Total Protein: 6.5 g/dL (ref 6.0–8.3)

## 2021-05-18 LAB — LIPID PANEL
Cholesterol: 137 mg/dL (ref 0–200)
HDL: 45.5 mg/dL (ref >39.00)
LDL Cholesterol: 62 mg/dL (ref 0–99)
NonHDL: 91.98
Total CHOL/HDL Ratio: 3
Triglycerides: 148 mg/dL (ref 0.0–149.0)
VLDL: 29.6 mg/dL (ref 0.0–40.0)

## 2021-05-18 LAB — CBC
HCT: 42.6 % (ref 39.0–52.0)
Hemoglobin: 13.9 g/dL (ref 13.0–17.0)
MCHC: 32.7 g/dL (ref 30.0–36.0)
MCV: 92.9 fl (ref 78.0–100.0)
Platelets: 223 10*3/uL (ref 150.0–400.0)
RBC: 4.58 Mil/uL (ref 4.22–5.81)
RDW: 14.6 % (ref 11.5–15.5)
WBC: 11.2 10*3/uL — ABNORMAL HIGH (ref 4.0–10.5)

## 2021-05-18 LAB — HEMOGLOBIN A1C: Hgb A1c MFr Bld: 7.7 % — ABNORMAL HIGH (ref 4.6–6.5)

## 2021-05-18 LAB — PSA: PSA: 0.4 ng/mL (ref 0.10–4.00)

## 2021-05-18 MED ORDER — TAMSULOSIN HCL 0.4 MG PO CAPS: 0.8000 mg | ORAL_CAPSULE | Freq: Every evening | ORAL | 0 refills | Status: DC

## 2021-05-18 NOTE — Patient Instructions (Signed)
Stop by the lab prior to leaving today. I will notify you of your results once received.  ? ?We increased the dose of your tamsulosin (Flomax) medication to 0.8 mg daily.  Take 2 capsules of your tamsulosin every evening.  Please update me in a few weeks via MyChart. ? ?Please schedule a follow up visit for 6 months for a diabetes check. ? ?It was a pleasure to see you today! ? ?Preventive Care 16-63 Years Old, Male ?Preventive care refers to lifestyle choices and visits with your health care provider that can promote health and wellness. Preventive care visits are also called wellness exams. ?What can I expect for my preventive care visit? ?Counseling ?During your preventive care visit, your health care provider may ask about your: ?Medical history, including: ?Past medical problems. ?Family medical history. ?Current health, including: ?Emotional well-being. ?Home life and relationship well-being. ?Sexual activity. ?Lifestyle, including: ?Alcohol, nicotine or tobacco, and drug use. ?Access to firearms. ?Diet, exercise, and sleep habits. ?Safety issues such as seatbelt and bike helmet use. ?Sunscreen use. ?Work and work Statistician. ?Physical exam ?Your health care provider will check your: ?Height and weight. These may be used to calculate your BMI (body mass index). BMI is a measurement that tells if you are at a healthy weight. ?Waist circumference. This measures the distance around your waistline. This measurement also tells if you are at a healthy weight and may help predict your risk of certain diseases, such as type 2 diabetes and high blood pressure. ?Heart rate and blood pressure. ?Body temperature. ?Skin for abnormal spots. ?What immunizations do I need? ?Vaccines are usually given at various ages, according to a schedule. Your health care provider will recommend vaccines for you based on your age, medical history, and lifestyle or other factors, such as travel or where you work. ?What tests do I  need? ?Screening ?Your health care provider may recommend screening tests for certain conditions. This may include: ?Lipid and cholesterol levels. ?Diabetes screening. This is done by checking your blood sugar (glucose) after you have not eaten for a while (fasting). ?Hepatitis B test. ?Hepatitis C test. ?HIV (human immunodeficiency virus) test. ?STI (sexually transmitted infection) testing, if you are at risk. ?Lung cancer screening. ?Prostate cancer screening. ?Colorectal cancer screening. ?Talk with your health care provider about your test results, treatment options, and if necessary, the need for more tests. ?Follow these instructions at home: ?Eating and drinking ? ?Eat a diet that includes fresh fruits and vegetables, whole grains, lean protein, and low-fat dairy products. ?Take vitamin and mineral supplements as recommended by your health care provider. ?Do not drink alcohol if your health care provider tells you not to drink. ?If you drink alcohol: ?Limit how much you have to 0-2 drinks a day. ?Know how much alcohol is in your drink. In the [place] one drink equals one 12 oz bottle of beer (355 mL), one 5 oz glass of wine (148 mL), or one 1? oz glass of hard liquor (44 mL). ?Lifestyle ?Brush your teeth every morning and night with fluoride toothpaste. Floss one time each day. ?Exercise for at least 30 minutes 5 or more days each week. ?Do not use any products that contain nicotine or tobacco. These products include cigarettes, chewing tobacco, and vaping devices, such as e-cigarettes. If you need help quitting, ask your health care provider. ?Do not use drugs. ?If you are sexually active, practice safe sex. Use a condom or other form of protection to prevent STIs. ?Take aspirin only as  told by your health care provider. Make sure that you understand how much to take and what form to take. Work with your health care provider to find out whether it is safe and beneficial for you to take aspirin daily. ?Find  healthy ways to manage stress, such as: ?Meditation, yoga, or listening to music. ?Journaling. ?Talking to a trusted person. ?Spending time with friends and family. ?Minimize exposure to UV radiation to reduce your risk of skin cancer. ?Safety ?Always wear your seat belt while driving or riding in a vehicle. ?Do not drive: ?If you have been drinking alcohol. Do not ride with someone who has been drinking. ?When you are tired or distracted. ?While texting. ?If you have been using any mind-altering substances or drugs. ?Wear a helmet and other protective equipment during sports activities. ?If you have firearms in your house, make sure you follow all gun safety procedures. ?What's next? ?Go to your health care provider once a year for an annual wellness visit. ?Ask your health care provider how often you should have your eyes and teeth checked. ?Stay up to date on all vaccines. ?This information is not intended to replace advice given to you by your health care provider. Make sure you discuss any questions you have with your health care provider. ?Document Revised: 07/21/2020 Document Reviewed: 07/21/2020 ?Elsevier Patient Education ? [place]. ? ?

## 2021-05-18 NOTE — Progress Notes (Signed)
? ?Subjective:  ? ? Patient ID: Seth Larsen, male    DOB: [birth year], 61 y.o.   MRN: [id number] ? ?HPI ? ?Seth Larsen is a very pleasant 61 y.o. male who presents today for complete physical and follow up of chronic conditions. ? ?Currently managed on tamsulosin 0.4 mg for nocturia for which he's been taking for about one year. Overall he's not noticed much improvement in his nocturia, he continues to get up twice during the night to use the bathroom. He does notice urinary frequency during the day with urgency. He does notice a weak stream at times.  ? ?Immunizations: ?-Tetanus: 2017 ?-Influenza: Completed this season  ?-Covid-19: 3 vaccines  ?-Shingles: Completed Shingrix  ? ?Diet: Fair diet.  ?Exercise: No regular exercise. ? ?Eye exam: Completes annually  ?Dental exam: Completes semi-annually ? ?Colonoscopy: Completed Cologuard in 2021, due 2024 ?PSA: Due ? ?BP Readings from Last 3 Encounters:  ?05/18/21 130/68  ?11/16/20 118/68  ?09/16/20 126/68  ? ? ? ? ? ?Review of Systems  ?Constitutional:  Negative for unexpected weight change.  ?HENT:  Negative for rhinorrhea.   ?Eyes:  Negative for visual disturbance.  ?Respiratory:  Negative for cough and shortness of breath.   ?Cardiovascular:  Negative for chest pain.  ?Gastrointestinal:  Negative for constipation and diarrhea.  ?Genitourinary:  Negative for difficulty urinating.  ?Musculoskeletal:  Positive for arthralgias and back pain.  ?Skin:  Negative for rash.  ?Allergic/Immunologic: Negative for environmental allergies.  ?Neurological:  Negative for dizziness and headaches.  ?Psychiatric/Behavioral:  The patient is not nervous/anxious.   ? ?   ? ? ?Past Medical History:  ?Diagnosis Date  ? Arthritis   ? Hypertension   ? Type 2 diabetes mellitus (HCC)   ? URI (upper respiratory infection) 03/04/2020  ? ? ?Social History  ? ?Socioeconomic History  ? Marital status: Married  ?  Spouse name: Not on file  ? Number of children: Not on file  ? Years of education:  Not on file  ? Highest education level: Not on file  ?Occupational History  ? Not on file  ?Tobacco Use  ? Smoking status: Never  ? Smokeless tobacco: Current  ?  Types: Chew  ?Substance and Sexual Activity  ? Alcohol use: No  ?  Alcohol/week: 0.0 standard drinks  ? Drug use: No  ? Sexual activity: Not on file  ?Other Topics Concern  ? Not on file  ?Social History Narrative  ? Married.  ? Works at numerous occupations.   ? Enjoys tending to his animals.   ? ?Social Determinants of Health  ? ?Financial Resource Strain: Not on file  ?Food Insecurity: Not on file  ?Transportation Needs: Not on file  ?Physical Activity: Not on file  ?Stress: Not on file  ?Social Connections: Not on file  ?Intimate Partner Violence: Not on file  ? ? ?Past Surgical History:  ?Procedure Laterality Date  ? BACK SURGERY    ? CARPAL TUNNEL RELEASE    ? KNEE SURGERY    ? TOTAL KNEE ARTHROPLASTY    ? ? ?Family History  ?Problem Relation Age of Onset  ? Hypertension Father   ? Alzheimer's disease Father   ? Diabetes Brother   ? Hypertension Mother   ? Breast cancer Mother   ? Breast cancer Sister   ? ? ?Allergies  ?Allergen Reactions  ? Lisinopril Other (See Comments)  ?  Myalgias, urinary frequency  ? ? ?Current Outpatient Medications on File Prior to Visit  ?  Medication Sig Dispense Refill  ? acetaminophen (TYLENOL) 500 MG tablet Take 500 mg by mouth every 6 (six) hours as needed.    ? atorvastatin (LIPITOR) 10 MG tablet Take 1 tablet (10 mg total) by mouth daily. For cholesterol 90 tablet 3  ? DULoxetine (CYMBALTA) 60 MG capsule Take 60 mg by mouth daily.    ? folic acid (FOLVITE) 1 MG tablet Take 1 mg by mouth daily.    ? gabapentin (NEURONTIN) 600 MG tablet TAKE 1 TABLET (600 MG TOTAL) BY MOUTH 3 (THREE) TIMES DAILY. FOR PAIN. 270 tablet 1  ? HUMIRA PEN 40 MG/0.4ML PNKT Inject 40 mLs as directed every 7 (seven) weeks.     ? hydrochlorothiazide (HYDRODIURIL) 12.5 MG tablet TAKE 1 TABLET (12.5 MG TOTAL) BY MOUTH DAILY. FOR BLOOD PRESSURE. 90  tablet 3  ? metFORMIN (GLUCOPHAGE) 500 MG tablet TAKE 1 TABLET (500 MG TOTAL) BY MOUTH 2 (TWO) TIMES DAILY WITH A MEAL. FOR DIABETES. 180 tablet 3  ? methotrexate (50 MG/ML) 1 g injection Inject 8 mg into the vein every 7 (seven) days.    ? olmesartan (BENICAR) 20 MG tablet TAKE 1 TABLET (20 MG TOTAL) BY MOUTH DAILY. FOR BLOOD PRESSURE. 90 tablet 3  ? tamsulosin (FLOMAX) 0.4 MG CAPS capsule TAKE 1 CAPSULE BY MOUTH EVERY EVENING FOR FREQUENT URINATION. 90 capsule 3  ? ?No current facility-administered medications on file prior to visit.  ? ? ?BP 130/68   Pulse 86   Temp (!) 97.3 ?F (36.3 ?C)   Resp 14   Ht 5' 7.5" (1.715 m)   Wt 282 lb 4 oz (128 kg)   SpO2 95%   BMI 43.55 kg/m?  ?Objective:  ? Physical Exam ?HENT:  ?   Right Ear: Tympanic membrane and ear canal normal.  ?   Left Ear: Tympanic membrane and ear canal normal.  ?   Nose: Nose normal.  ?   Right Sinus: No maxillary sinus tenderness or frontal sinus tenderness.  ?   Left Sinus: No maxillary sinus tenderness or frontal sinus tenderness.  ?Eyes:  ?   Conjunctiva/sclera: Conjunctivae normal.  ?Neck:  ?   Thyroid: No thyromegaly.  ?   Vascular: No carotid bruit.  ?Cardiovascular:  ?   Rate and Rhythm: Normal rate and regular rhythm.  ?   Heart sounds: Normal heart sounds.  ?Pulmonary:  ?   Effort: Pulmonary effort is normal.  ?   Breath sounds: Normal breath sounds. No wheezing or rales.  ?Abdominal:  ?   General: Bowel sounds are normal.  ?   Palpations: Abdomen is soft.  ?   Tenderness: There is no abdominal tenderness.  ?Musculoskeletal:     ?   General: Normal range of motion.  ?   Cervical back: Neck supple.  ?Skin: ?   General: Skin is warm and dry.  ?Neurological:  ?   Mental Status: He is alert and oriented to person, place, and time.  ?   Cranial Nerves: No cranial nerve deficit.  ?   Deep Tendon Reflexes: Reflexes are normal and symmetric.  ?Psychiatric:     ?   Mood and Affect: Mood normal.  ? ? ? ? ? ?   ?Assessment & Plan:  ? ? ? ? ?This  visit occurred during the SARS-CoV-2 public health emergency.  Safety protocols were in place, including screening questions prior to the visit, additional usage of staff PPE, and extensive cleaning of exam room while observing appropriate contact time  as indicated for disinfecting solutions.  ?

## 2021-05-18 NOTE — Assessment & Plan Note (Signed)
Non compliant to CPAP machine. ?Discussed long term effects of not using CPAP machine.  ?

## 2021-05-18 NOTE — Assessment & Plan Note (Signed)
Following with rheumatology. ? ?Continue Humira as prescribed. ?Continue methotrexate 8 mg weekly. ?Continue duloxetine 60 mg daily. ?

## 2021-05-18 NOTE — Assessment & Plan Note (Signed)
Immunizations up-to-date. ?Colon cancer screening up-to-date, due for Cologuard in 2024. ?PSA due and pending. ? ?Discussed the importance of a healthy diet and regular exercise in order for weight loss, and to reduce the risk of further co-morbidity. ? ?Exam today stable ?Labs pending ?

## 2021-05-18 NOTE — Assessment & Plan Note (Signed)
Continue atorvastatin 10 mg daily. Repeat lipid panel pending. 

## 2021-05-18 NOTE — Assessment & Plan Note (Signed)
Repeat A1C pending. ? ?Continue metformin 500 mg BID. ? ?Managed on statin and ARB. ?Pneumonia vaccine UTD.  ? ?Follow up in 6 months. ?

## 2021-05-18 NOTE — Assessment & Plan Note (Signed)
Overall stable. ? ?Continue duloxetine 60 mg daily, gabapentin 600 mg 3 times daily. ? ? ?

## 2021-05-18 NOTE — Assessment & Plan Note (Signed)
Continued despite tamsulosin 0.4 mg daily. ? ?Increase tamsulosin to 0.8 mg daily. ?PSA due and pending. ?Patient declines prostate exam today. ? ?He will update in a few weeks. ?

## 2021-06-24 ENCOUNTER — Other Ambulatory Visit: Payer: Self-pay | Admitting: Primary Care

## 2021-06-24 DIAGNOSIS — I10 Essential (primary) hypertension: Secondary | ICD-10-CM

## 2021-07-14 ENCOUNTER — Other Ambulatory Visit: Payer: Self-pay | Admitting: Primary Care

## 2021-07-14 DIAGNOSIS — G8929 Other chronic pain: Secondary | ICD-10-CM

## 2021-07-21 ENCOUNTER — Other Ambulatory Visit: Payer: Self-pay | Admitting: Primary Care

## 2021-07-21 DIAGNOSIS — R351 Nocturia: Secondary | ICD-10-CM

## 2021-08-07 ENCOUNTER — Other Ambulatory Visit: Payer: Self-pay | Admitting: Primary Care

## 2021-08-07 DIAGNOSIS — E785 Hyperlipidemia, unspecified: Secondary | ICD-10-CM

## 2021-08-10 ENCOUNTER — Other Ambulatory Visit: Payer: Self-pay | Admitting: Primary Care

## 2021-08-10 DIAGNOSIS — I10 Essential (primary) hypertension: Secondary | ICD-10-CM

## 2021-08-30 ENCOUNTER — Other Ambulatory Visit (HOSPITAL_COMMUNITY): Payer: Self-pay

## 2021-08-31 ENCOUNTER — Other Ambulatory Visit (HOSPITAL_COMMUNITY): Payer: Self-pay

## 2021-08-31 MED ORDER — METHOTREXATE SODIUM CHEMO INJECTION 50 MG/2ML
INTRAMUSCULAR | 1 refills | Status: DC
  Filled 2021-08-31: qty 4, 34d supply, fill #0
  Filled 2021-09-28: qty 2, 14d supply, fill #1
  Filled 2021-10-12: qty 4, 34d supply, fill #2
  Filled 2021-11-14: qty 4, 34d supply, fill #3

## 2021-09-03 ENCOUNTER — Other Ambulatory Visit: Payer: Self-pay | Admitting: Primary Care

## 2021-09-03 DIAGNOSIS — E119 Type 2 diabetes mellitus without complications: Secondary | ICD-10-CM

## 2021-09-07 ENCOUNTER — Other Ambulatory Visit: Payer: Self-pay | Admitting: Primary Care

## 2021-09-07 DIAGNOSIS — E119 Type 2 diabetes mellitus without complications: Secondary | ICD-10-CM

## 2021-09-28 ENCOUNTER — Other Ambulatory Visit (HOSPITAL_COMMUNITY): Payer: Self-pay

## 2021-09-29 ENCOUNTER — Other Ambulatory Visit (HOSPITAL_COMMUNITY): Payer: Self-pay

## 2021-10-12 ENCOUNTER — Other Ambulatory Visit (HOSPITAL_COMMUNITY): Payer: Self-pay

## 2021-10-16 ENCOUNTER — Other Ambulatory Visit: Payer: Self-pay | Admitting: Primary Care

## 2021-10-16 DIAGNOSIS — R351 Nocturia: Secondary | ICD-10-CM

## 2021-11-07 ENCOUNTER — Other Ambulatory Visit: Payer: Self-pay | Admitting: Primary Care

## 2021-11-07 ENCOUNTER — Telehealth: Payer: Self-pay | Admitting: Primary Care

## 2021-11-07 DIAGNOSIS — E119 Type 2 diabetes mellitus without complications: Secondary | ICD-10-CM

## 2021-11-07 DIAGNOSIS — E1165 Type 2 diabetes mellitus with hyperglycemia: Secondary | ICD-10-CM

## 2021-11-07 MED ORDER — METFORMIN HCL 1000 MG PO TABS: 1000.0000 mg | ORAL_TABLET | Freq: Two times a day (BID) | ORAL | 0 refills | Status: DC

## 2021-11-07 NOTE — Telephone Encounter (Signed)
Please call patient:  I accidentally refilled the metformin 500 mg 1 tab BID as the pharmacy sent me this request.  I will send the correct Rx which is metformin 1000 mg, 1 tab BID to [facility] now. Make sure he understands that the new Rx will have the 1000 mg dose, not 500 mg.

## 2021-11-07 NOTE — Telephone Encounter (Signed)
  Encourage patient to contact the pharmacy for refills or they can request refills through [facility]  Did the patient contact the pharmacy:  Y   LAST APPOINTMENT DATE: 05/18/21  NEXT APPOINTMENT DATE:  MEDICATION:metFORMIN (GLUCOPHAGE) 500 MG tablet  Is the patient out of medication? Y  If not, how much is left?  Is this a 90 day supply: 180  PHARMACY: [id number] [place], [place] Phone:  [phone]  Fax:  [phone]    Patient was switched to take 4 a day at his last appointment on 05/18/21.  Let patient know to contact pharmacy at the end of the day to make sure medication is ready.  Please notify patient to allow 48-72 hours to process

## 2021-11-08 NOTE — Telephone Encounter (Signed)
Patient notified

## 2021-11-14 ENCOUNTER — Other Ambulatory Visit (HOSPITAL_COMMUNITY): Payer: Self-pay

## 2021-11-17 ENCOUNTER — Encounter: Payer: Self-pay | Admitting: Primary Care

## 2021-11-17 ENCOUNTER — Ambulatory Visit (INDEPENDENT_AMBULATORY_CARE_PROVIDER_SITE_OTHER): Payer: No Typology Code available for payment source | Admitting: Primary Care

## 2021-11-17 VITALS — BP 128/68 | HR 70 | Temp 97.7°F | Ht 67.5 in | Wt 280.0 lb

## 2021-11-17 DIAGNOSIS — M549 Dorsalgia, unspecified: Secondary | ICD-10-CM | POA: Diagnosis not present

## 2021-11-17 DIAGNOSIS — G8929 Other chronic pain: Secondary | ICD-10-CM

## 2021-11-17 DIAGNOSIS — E1165 Type 2 diabetes mellitus with hyperglycemia: Secondary | ICD-10-CM

## 2021-11-17 DIAGNOSIS — Z23 Encounter for immunization: Secondary | ICD-10-CM

## 2021-11-17 LAB — MICROALBUMIN / CREATININE URINE RATIO
Creatinine,U: 75.7 mg/dL
Microalb Creat Ratio: 1.3 mg/g (ref 0.0–30.0)
Microalb, Ur: 1 mg/dL (ref 0.0–1.9)

## 2021-11-17 LAB — POCT GLYCOSYLATED HEMOGLOBIN (HGB A1C): Hemoglobin A1C: 6.7 % — AB (ref 4.0–5.6)

## 2021-11-17 MED ORDER — OZEMPIC (0.25 OR 0.5 MG/DOSE) 2 MG/3ML ~~LOC~~ SOPN: PEN_INJECTOR | SUBCUTANEOUS | 0 refills | Status: DC

## 2021-11-17 MED ORDER — CYCLOBENZAPRINE HCL 5 MG PO TABS: 5.0000 mg | ORAL_TABLET | Freq: Two times a day (BID) | ORAL | 0 refills | Status: DC | PRN

## 2021-11-17 NOTE — Patient Instructions (Signed)
Start semaglutide (Ozempic) for weight loss and diabetes. Inject 0.25 mg into the skin once weekly x 4 weeks, then increase to 0.5 mg once weekly thereafter for diabetes.  You may take the cyclobenzaprine muscle relaxer twice daily as needed for muscle spasms/back pain. This may cause drowsiness.  Please schedule a follow up visit for 6 months for your annual physical and diabetes check.  It was a pleasure to see you today!

## 2021-11-17 NOTE — Assessment & Plan Note (Signed)
Acute on chronic flare.   Suspect MSK etiology given that his pain is noticeable with certain movements. No obvious shingles. Doesn't seem to be renal stone.  Start cyclobenzaprine 5 mg BID PRN. Discussed stretching, walking, etc.  Follow up PRN.

## 2021-11-17 NOTE — Progress Notes (Signed)
Subjective:    Patient ID: Seth Larsen, male    DOB: Jan 26, 1961, 61 y.o.   MRN: [id number]  HPI  Seth Larsen is a very pleasant 61 y.o. male with a history of type 2 diabetes, hypertension, rheumatoid arthritis, OSA, hypertension who presents today for follow up of diabetes. He would also like to discuss back pain.  1) Type 2 Diabetes: Current medications include: metformin 1000 mg BID. He is interested in weight loss medication. His brother is currently on a weight loss pill and has lost 40 pounds.   He is checking his blood glucose 0 times daily.  Last A1C: 7.7 in April 2023, 6.7 today Last Eye Exam: UTD Last Foot Exam: Due Pneumonia Vaccination: 2017 Urine Microalbumin: Due Statin: atorvastatin   Dietary changes since last visit: He endorses an overall fair diet.    Exercise: Active at work and home.   BP Readings from Last 3 Encounters:  11/17/21 128/68  05/18/21 130/68  11/16/20 118/68   2) Acute on Chronic Back Pain: Acute to the left lower to mid thoracic back for the last 2-3 weeks. His pain is noticeable when laying in his chair or when moving in certain directions. He does not notice his pain when walking. He is taking gabapentin 600 mg BID, also on methotrexate and Humira for rheumatoid arthritis.   He denies injury, increased physical activity, urinary symptoms, difficulty urinating, radiation of pain to groin or left lower extremity.     Review of Systems  Respiratory:  Negative for shortness of breath.   Cardiovascular:  Negative for chest pain.  Neurological:  Negative for dizziness and numbness.         Past Medical History:  Diagnosis Date   Arthritis    COVID-19 virus infection 08/27/2020   Hypertension    Type 2 diabetes mellitus (HCC)    URI (upper respiratory infection) 03/04/2020    Social History   Socioeconomic History   Marital status: Married    Spouse name: Not on file   Number of children: Not on file   Years of education:  Not on file   Highest education level: Not on file  Occupational History   Not on file  Tobacco Use   Smoking status: Never   Smokeless tobacco: Current    Types: Chew  Substance and Sexual Activity   Alcohol use: No    Alcohol/week: 0.0 standard drinks of alcohol   Drug use: No   Sexual activity: Not on file  Other Topics Concern   Not on file  Social History Narrative   Married.   Works at numerous occupations.    Enjoys tending to his animals.    Social Determinants of Health   Financial Resource Strain: Not on file  Food Insecurity: Not on file  Transportation Needs: Not on file  Physical Activity: Not on file  Stress: Not on file  Social Connections: Not on file  Intimate Partner Violence: Not on file    Past Surgical History:  Procedure Laterality Date   BACK SURGERY     CARPAL TUNNEL RELEASE     KNEE SURGERY     TOTAL KNEE ARTHROPLASTY      Family History  Problem Relation Age of Onset   Hypertension Father    Alzheimer's disease Father    Diabetes Brother    Hypertension Mother    Breast cancer Mother    Breast cancer Sister     Allergies  Allergen Reactions  Lisinopril Other (See Comments)    Myalgias, urinary frequency    Current Outpatient Medications on File Prior to Visit  Medication Sig Dispense Refill   acetaminophen (TYLENOL) 500 MG tablet Take 500 mg by mouth every 6 (six) hours as needed.     atorvastatin (LIPITOR) 10 MG tablet TAKE 1 TABLET BY MOUTH DAILY. FOR CHOLESTEROL 90 tablet 2   DULoxetine (CYMBALTA) 60 MG capsule Take 60 mg by mouth daily.     folic acid (FOLVITE) 1 MG tablet Take 1 mg by mouth daily.     gabapentin (NEURONTIN) 600 MG tablet TAKE 1 TABLET (600 MG TOTAL) BY MOUTH 3 (THREE) TIMES DAILY. FOR PAIN. 270 tablet 2   HUMIRA PEN 40 MG/0.4ML PNKT Inject 40 mLs as directed every 7 (seven) weeks.      hydrochlorothiazide (HYDRODIURIL) 12.5 MG tablet TAKE 1 TABLET (12.5 MG TOTAL) BY MOUTH DAILY. FOR BLOOD PRESSURE. 90  tablet 3   metFORMIN (GLUCOPHAGE) 1000 MG tablet Take 1 tablet (1,000 mg total) by mouth 2 (two) times daily with a meal. for diabetes. 180 tablet 0   methotrexate 50 MG/2ML injection Inject 0.8 ml weekly as directed on the same day each week.  **Discard 28 days after first use.** 8 mL 1   olmesartan (BENICAR) 20 MG tablet TAKE 1 TABLET (20 MG TOTAL) BY MOUTH DAILY. FOR BLOOD PRESSURE. 90 tablet 2   tamsulosin (FLOMAX) 0.4 MG CAPS capsule TAKE 2 CAPSULES (0.8 MG TOTAL) BY MOUTH DAILY. FOR URINARY FREQUNCY 180 capsule 1   methotrexate (50 MG/ML) 1 g injection Inject 8 mg into the vein every 7 (seven) days.     No current facility-administered medications on file prior to visit.    BP 128/68   Pulse 70   Temp 97.7 F (36.5 C) (Temporal)   Ht 5' 7.5" (1.715 m)   Wt 280 lb (127 kg)   SpO2 96%   BMI 43.21 kg/m  Objective:   Physical Exam Cardiovascular:     Rate and Rhythm: Normal rate and regular rhythm.  Pulmonary:     Effort: Pulmonary effort is normal.     Breath sounds: Normal breath sounds. No wheezing or rales.  Musculoskeletal:     Cervical back: Neck supple.     Thoracic back: No tenderness. Normal range of motion.       Back:  Skin:    General: Skin is warm and dry.  Neurological:     Mental Status: He is alert and oriented to person, place, and time.           Assessment & Plan:   Problem List Items Addressed This Visit       Endocrine   Type 2 diabetes mellitus with hyperglycemia ([place]) - Primary    Improved with A1C of 6.7 today!  Continue metformin 1000 mg BID. Add Ozempic 0.25 mg weekly x 4 weeks, then 0.5 mg weekly thereafter for weight loss and diabetes.  Managed on statin. Urine microalbumin pending.   Pneumonia vaccine UTD.   Follow up in 6 months.      Relevant Medications   Semaglutide,0.25 or 0.5MG /DOS, (OZEMPIC, 0.25 OR 0.5 MG/DOSE,) 2 MG/3ML SOPN   Other Relevant Orders   Microalbumin/Creatinine Ratio, Urine   POCT glycosylated  hemoglobin (Hb A1C)     Other   Chronic back pain    Acute on chronic flare.   Suspect MSK etiology given that his pain is noticeable with certain movements. No obvious shingles. Doesn't seem to be  renal stone.  Start cyclobenzaprine 5 mg BID PRN. Discussed stretching, walking, etc.  Follow up PRN.      Relevant Medications   cyclobenzaprine (FLEXERIL) 5 MG tablet   Other Visit Diagnoses     Need for immunization against influenza       Relevant Orders   Flu Vaccine QUAD 48mo+IM (Fluarix, Fluzone & Alfiuria Quad PF) (Completed)          Pleas Koch, NP

## 2021-11-17 NOTE — Assessment & Plan Note (Addendum)
Improved with A1C of 6.7 today!  Continue metformin 1000 mg BID. Add Ozempic 0.25 mg weekly x 4 weeks, then 0.5 mg weekly thereafter for weight loss and diabetes.  Managed on statin. Urine microalbumin pending.   Pneumonia vaccine UTD.   Follow up in 6 months.

## 2021-12-20 ENCOUNTER — Other Ambulatory Visit (HOSPITAL_COMMUNITY): Payer: Self-pay

## 2021-12-21 ENCOUNTER — Other Ambulatory Visit: Payer: Self-pay | Admitting: Primary Care

## 2021-12-21 ENCOUNTER — Other Ambulatory Visit (HOSPITAL_COMMUNITY): Payer: Self-pay

## 2021-12-21 DIAGNOSIS — E1165 Type 2 diabetes mellitus with hyperglycemia: Secondary | ICD-10-CM

## 2021-12-26 ENCOUNTER — Other Ambulatory Visit (HOSPITAL_COMMUNITY): Payer: Self-pay

## 2021-12-26 MED ORDER — METHOTREXATE SODIUM CHEMO INJECTION 50 MG/2ML
20.0000 mg | INTRAMUSCULAR | 0 refills | Status: AC
  Filled 2021-12-26: qty 2, 14d supply, fill #0

## 2021-12-27 ENCOUNTER — Other Ambulatory Visit (HOSPITAL_COMMUNITY): Payer: Self-pay

## 2022-01-05 ENCOUNTER — Other Ambulatory Visit (HOSPITAL_COMMUNITY): Payer: Self-pay

## 2022-01-05 MED ORDER — METHOTREXATE SODIUM CHEMO INJECTION 250 MG/10ML
20.0000 mg | INTRAMUSCULAR | 1 refills | Status: DC
  Filled 2022-01-05: qty 10, 84d supply, fill #0

## 2022-01-06 ENCOUNTER — Other Ambulatory Visit (HOSPITAL_COMMUNITY): Payer: Self-pay

## 2022-01-09 ENCOUNTER — Other Ambulatory Visit: Payer: Self-pay | Admitting: Primary Care

## 2022-01-09 ENCOUNTER — Other Ambulatory Visit (HOSPITAL_COMMUNITY): Payer: Self-pay

## 2022-01-09 DIAGNOSIS — E1165 Type 2 diabetes mellitus with hyperglycemia: Secondary | ICD-10-CM

## 2022-01-10 ENCOUNTER — Other Ambulatory Visit (HOSPITAL_COMMUNITY): Payer: Self-pay

## 2022-01-16 ENCOUNTER — Other Ambulatory Visit (HOSPITAL_COMMUNITY): Payer: Self-pay

## 2022-01-20 ENCOUNTER — Other Ambulatory Visit (HOSPITAL_COMMUNITY): Payer: Self-pay

## 2022-01-23 ENCOUNTER — Other Ambulatory Visit (HOSPITAL_COMMUNITY): Payer: Self-pay

## 2022-01-31 ENCOUNTER — Other Ambulatory Visit (HOSPITAL_COMMUNITY): Payer: Self-pay

## 2022-02-02 ENCOUNTER — Telehealth: Payer: No Typology Code available for payment source | Admitting: Family Medicine

## 2022-02-02 DIAGNOSIS — U071 COVID-19: Secondary | ICD-10-CM | POA: Diagnosis not present

## 2022-02-02 MED ORDER — MOLNUPIRAVIR EUA 200MG CAPSULE: 4.0000 | ORAL_CAPSULE | Freq: Two times a day (BID) | ORAL | 0 refills | Status: AC

## 2022-02-02 NOTE — Patient Instructions (Signed)
Seth Larsen, thank you for joining Freddy Finner, NP for today's virtual visit.  While this provider is not your primary care provider (PCP), if your PCP is located in our provider database this encounter information will be shared with them immediately following your visit.   A [facility] MyChart account gives you access to today's visit and all your visits, tests, and labs performed at [facility] " click here if you don't have a [facility] MyChart account or go to [email or web address]  Consent: (Patient) Seth Larsen provided verbal consent for this virtual visit at the beginning of the encounter.  Current Medications:  Current Outpatient Medications:    molnupiravir EUA (LAGEVRIO) 200 mg CAPS capsule, Take 4 capsules (800 mg total) by mouth 2 (two) times daily for 5 days., Disp: 40 capsule, Rfl: 0   acetaminophen (TYLENOL) 500 MG tablet, Take 500 mg by mouth every 6 (six) hours as needed., Disp: , Rfl:    atorvastatin (LIPITOR) 10 MG tablet, TAKE 1 TABLET BY MOUTH DAILY. FOR CHOLESTEROL, Disp: 90 tablet, Rfl: 2   cyclobenzaprine (FLEXERIL) 5 MG tablet, Take 1 tablet (5 mg total) by mouth 2 (two) times daily as needed for muscle spasms., Disp: 14 tablet, Rfl: 0   DULoxetine (CYMBALTA) 60 MG capsule, Take 60 mg by mouth daily., Disp: , Rfl:    folic acid (FOLVITE) 1 MG tablet, Take 1 mg by mouth daily., Disp: , Rfl:    gabapentin (NEURONTIN) 600 MG tablet, TAKE 1 TABLET (600 MG TOTAL) BY MOUTH 3 (THREE) TIMES DAILY. FOR PAIN., Disp: 270 tablet, Rfl: 2   HUMIRA PEN 40 MG/0.4ML PNKT, Inject 40 mLs as directed every 7 (seven) weeks. , Disp: , Rfl:    hydrochlorothiazide (HYDRODIURIL) 12.5 MG tablet, TAKE 1 TABLET (12.5 MG TOTAL) BY MOUTH DAILY. FOR BLOOD PRESSURE., Disp: 90 tablet, Rfl: 3   metFORMIN (GLUCOPHAGE) 1000 MG tablet, Take 1 tablet (1,000 mg total) by mouth 2 (two) times daily with a meal. for diabetes., Disp: 180 tablet, Rfl: 0   methotrexate (50 MG/ML) 1 g  injection, Inject 8 mg into the vein every 7 (seven) days., Disp: , Rfl:    methotrexate 250 MG/10ML injection, Inject 0.8 mLs (20 mg total) into the skin every 7 (seven) days on the same day., Disp: 30 mL, Rfl: 1   methotrexate 50 MG/2ML injection, Inject 0.8 ml weekly as directed on the same day each week.  **Discard 28 days after first use.**, Disp: 8 mL, Rfl: 1   methotrexate 50 MG/2ML injection, Inject 0.8 mLs (20 mg total) as directed once a week on the same day each week., Disp: 2 mL, Rfl: 0   olmesartan (BENICAR) 20 MG tablet, TAKE 1 TABLET (20 MG TOTAL) BY MOUTH DAILY. FOR BLOOD PRESSURE., Disp: 90 tablet, Rfl: 2   Semaglutide,0.25 or 0.5MG /DOS, (OZEMPIC, 0.25 OR 0.5 MG/DOSE,) 2 MG/3ML SOPN, Inject 0.25 mg weekly into the skin x 4 weeks, increase to 0.5 mg weekly thereafter for diabetes., Disp: 6 mL, Rfl: 0   tamsulosin (FLOMAX) 0.4 MG CAPS capsule, TAKE 2 CAPSULES (0.8 MG TOTAL) BY MOUTH DAILY. FOR URINARY FREQUNCY, Disp: 180 capsule, Rfl: 1   Medications ordered in this encounter:  Meds ordered this encounter  Medications   molnupiravir EUA (LAGEVRIO) 200 mg CAPS capsule    Sig: Take 4 capsules (800 mg total) by mouth 2 (two) times daily for 5 days.    Dispense:  40 capsule    Refill:  0  Order Specific Question:   Supervising Provider    Answer:   Merrilee Jansky [[id number]]     *If you need refills on other medications prior to your next appointment, please contact your pharmacy*  Follow-Up: Call back or seek an in-person evaluation if the symptoms worsen or if the condition fails to improve as anticipated.  [facility] [phone]  Other Instructions  Please keep well-hydrated and get plenty of rest. Start a saline nasal rinse to flush out your nasal passages. You can use plain Mucinex to help thin congestion. If you have a humidifier, running in the bedroom at night. I want you to start OTC vitamin D3 1000 units daily, vitamin C 1000 mg daily, and a  zinc supplement. Please take prescribed medications as directed.  You have been enrolled in a MyChart symptom monitoring program. Please answer these questions daily so we can keep track of how you are doing.  You were to quarantine for 5 days from onset of your symptoms.  After day 5, if you have had no fever and you are feeling better, you can end quarantine but need to mask for an additional 5 days. After day 5 if you have a fever or are having significant symptoms, please quarantine for full 10 days.  If you note any worsening of symptoms, any significant shortness of breath or any chest pain, please seek ER evaluation ASAP.  Please do not delay care!  COVID-19: What to Do if You Are Sick If you test positive and are an older adult or someone who is at high risk of getting very sick from COVID-19, treatment may be available. Contact a healthcare provider right away after a positive test to determine if you are eligible, even if your symptoms are mild right now. You can also visit a Test to Treat location and, if eligible, receive a prescription from a provider. Don't delay: Treatment must be started within the first few days to be effective. If you have a fever, cough, or other symptoms, you might have COVID-19. Most people have mild illness and are able to recover at home. If you are sick: Keep track of your symptoms. If you have an emergency warning sign (including trouble breathing), call 911. Steps to help prevent the spread of COVID-19 if you are sick If you are sick with COVID-19 or think you might have COVID-19, follow the steps below to care for yourself and to help protect other people in your home and community. Stay home except to get medical care Stay home. Most people with COVID-19 have mild illness and can recover at home without medical care. Do not leave your home, except to get medical care. Do not visit public areas and do not go to places where you are unable to wear a  mask. Take care of yourself. Get rest and stay hydrated. Take over-the-counter medicines, such as acetaminophen, to help you feel better. Stay in touch with your doctor. Call before you get medical care. Be sure to get care if you have trouble breathing, or have any other emergency warning signs, or if you think it is an emergency. Avoid public transportation, ride-sharing, or taxis if possible. Get tested If you have symptoms of COVID-19, get tested. While waiting for test results, stay away from others, including staying apart from those living in your household. Get tested as soon as possible after your symptoms start. Treatments may be available for people with COVID-19 who are at risk for  becoming very sick. Don't delay: Treatment must be started early to be effective--some treatments must begin within 5 days of your first symptoms. Contact your healthcare provider right away if your test result is positive to determine if you are eligible. Self-tests are one of several options for testing for the virus that causes COVID-19 and may be more convenient than laboratory-based tests and point-of-care tests. Ask your healthcare provider or your local health department if you need help interpreting your test results. You can visit your state, tribal, local, and territorial health department's website to look for the latest local information on testing sites. Separate yourself from other people As much as possible, stay in a specific room and away from other people and pets in your home. If possible, you should use a separate bathroom. If you need to be around other people or animals in or outside of the home, wear a well-fitting mask. Tell your close contacts that they may have been exposed to COVID-19. An infected person can spread COVID-19 starting 48 hours (or 2 days) before the person has any symptoms or tests positive. By letting your close contacts know they may have been exposed to COVID-19, you are  helping to protect everyone. See COVID-19 and Animals if you have questions about pets. If you are diagnosed with COVID-19, someone from the health department may call you. Answer the call to slow the spread. Monitor your symptoms Symptoms of COVID-19 include fever, cough, or other symptoms. Follow care instructions from your healthcare provider and local health department. Your local health authorities may give instructions on checking your symptoms and reporting information. When to seek emergency medical attention Look for emergency warning signs* for COVID-19. If someone is showing any of these signs, seek emergency medical care immediately: Trouble breathing Persistent pain or pressure in the chest New confusion Inability to wake or stay awake Pale, gray, or blue-colored skin, lips, or nail beds, depending on skin tone *This list is not all possible symptoms. Please call your medical provider for any other symptoms that are severe or concerning to you. Call 911 or call ahead to your local emergency facility: Notify the operator that you are seeking care for someone who has or may have COVID-19. Call ahead before visiting your doctor Call ahead. Many medical visits for routine care are being postponed or done by phone or telemedicine. If you have a medical appointment that cannot be postponed, call your doctor's office, and tell them you have or may have COVID-19. This will help the office protect themselves and other patients. If you are sick, wear a well-fitting mask You should wear a mask if you must be around other people or animals, including pets (even at home). Wear a mask with the best fit, protection, and comfort for you. You don't need to wear the mask if you are alone. If you can't put on a mask (because of trouble breathing, for example), cover your coughs and sneezes in some other way. Try to stay at least 6 feet away from other people. This will help protect the people around  you. Masks should not be placed on young children under age 79 years, anyone who has trouble breathing, or anyone who is not able to remove the mask without help. Cover your coughs and sneezes Cover your mouth and nose with a tissue when you cough or sneeze. Throw away used tissues in a lined trash can. Immediately wash your hands with soap and water for at least 20 seconds.  If soap and water are not available, clean your hands with an alcohol-based hand sanitizer that contains at least 60% alcohol. Clean your hands often Wash your hands often with soap and water for at least 20 seconds. This is especially important after blowing your nose, coughing, or sneezing; going to the bathroom; and before eating or preparing food. Use hand sanitizer if soap and water are not available. Use an alcohol-based hand sanitizer with at least 60% alcohol, covering all surfaces of your hands and rubbing them together until they feel dry. Soap and water are the best option, especially if hands are visibly dirty. Avoid touching your eyes, nose, and mouth with unwashed hands. Handwashing Tips Avoid sharing personal household items Do not share dishes, drinking glasses, cups, eating utensils, towels, or bedding with other people in your home. Wash these items thoroughly after using them with soap and water or put in the dishwasher. Clean surfaces in your home regularly Clean and disinfect high-touch surfaces (for example, doorknobs, tables, handles, light switches, and countertops) in your "sick room" and bathroom. In shared spaces, you should clean and disinfect surfaces and items after each use by the person who is ill. If you are sick and cannot clean, a caregiver or other person should only clean and disinfect the area around you (such as your bedroom and bathroom) on an as needed basis. Your caregiver/other person should wait as long as possible (at least several hours) and wear a mask before entering, cleaning, and  disinfecting shared spaces that you use. Clean and disinfect areas that may have blood, stool, or body fluids on them. Use household cleaners and disinfectants. Clean visible dirty surfaces with household cleaners containing soap or detergent. Then, use a household disinfectant. Use a product from [employer] List N: Disinfectants for Coronavirus (COVID-19). Be sure to follow the instructions on the label to ensure safe and effective use of the product. Many products recommend keeping the surface wet with a disinfectant for a certain period of time (look at "contact time" on the product label). You may also need to wear personal protective equipment, such as gloves, depending on the directions on the product label. Immediately after disinfecting, wash your hands with soap and water for 20 seconds. For completed guidance on cleaning and disinfecting your home, visit Complete Disinfection Guidance. Take steps to improve ventilation at home Improve ventilation (air flow) at home to help prevent from spreading COVID-19 to other people in your household. Clear out COVID-19 virus particles in the air by opening windows, using air filters, and turning on fans in your home. Use this interactive tool to learn how to improve air flow in your home. When you can be around others after being sick with COVID-19 Deciding when you can be around others is different for different situations. Find out when you can safely end home isolation. For any additional questions about your care, contact your healthcare provider or state or local health department. 04/27/2020 Content source: [facility] ([facility]), Division of Viral Diseases This information is not intended to replace advice given to you by your health care provider. Make sure you discuss any questions you have with your health care provider. Document Revised: 06/10/2020 Document Reviewed: 06/10/2020 Elsevier Patient  Education  2022 [employer].      If you have been instructed to have an in-person evaluation today at a local Urgent Care facility, please use the link below. It will take you to a list of all  of our available [facility] Urgent C[facility], including address, phone number and hours of operation. Please do not delay care.  [facility]  If you or a family member do not have a primary care provider, use the link below to schedule a visit and establish care. When you choose a [facility] primary care physician or advanced practice provider, you gain a long-term partner in health. Find a Primary Care Provider  Learn more about [facility]'s in-office and virtual care options: [facility]

## 2022-02-02 NOTE — Progress Notes (Signed)
Virtual Visit Consent   Seth Larsen, you are scheduled for a virtual visit with a [facility] provider today. Just as with appointments in the office, your consent must be obtained to participate. Your consent will be active for this visit and any virtual visit you may have with one of our providers in the next 365 days. If you have a MyChart account, a copy of this consent can be sent to you electronically.  As this is a virtual visit, video technology does not allow for your provider to perform a traditional examination. This may limit your provider's ability to fully assess your condition. If your provider identifies any concerns that need to be evaluated in person or the need to arrange testing (such as labs, EKG, etc.), we will make arrangements to do so. Although advances in technology are sophisticated, we cannot ensure that it will always work on either your end or our end. If the connection with a video visit is poor, the visit may have to be switched to a telephone visit. With either a video or telephone visit, we are not always able to ensure that we have a secure connection.  By engaging in this virtual visit, you consent to the provision of healthcare and authorize for your insurance to be billed (if applicable) for the services provided during this visit. Depending on your insurance coverage, you may receive a charge related to this service.  I need to obtain your verbal consent now. Are you willing to proceed with your visit today? Seth Larsen has provided verbal consent on 02/02/2022 for a virtual visit (video or telephone). Freddy Finner, NP  Date: 02/02/2022 11:13 AM  Virtual Visit via Video Note   I, Freddy Finner, connected with  Seth Larsen  ([id number], 1960/09/14) on 02/02/22 at 11:15 AM EST by a video-enabled telemedicine application and verified that I am speaking with the correct person using two identifiers.  Location: Patient: Virtual Visit Location Patient:  Home Provider: Virtual Visit Location Provider: Home Office   I discussed the limitations of evaluation and management by telemedicine and the availability of in person appointments. The patient expressed understanding and agreed to proceed.    History of Present Illness: Seth Larsen is a 61 y.o. who identifies as a male who was assigned male at birth, and is being seen today for COVID + tested positive, 02/02/22- Wednesday started feeling bad- tired, achy, colder -chilly, fever -101.2 Max. Mild congestion and cough. Denies chest pain, shortness of breath, sore throat, ear pain.  COVID vaccines but not the last booster. Wife in the home- she does not have symptoms.Unsure where he got it. Wearing a mask during visit.   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   Lower extremity edema 04/06/2020   Chronic foot pain 04/06/2020   Nocturia 05/14/2019   Rheumatoid arthritis (HCC) 08/06/2018   Hyperlipidemia 07/23/2015   Type 2 diabetes mellitus with hyperglycemia (HCC) 07/23/2015   Preventative health care 07/23/2015   OSA (obstructive sleep apnea) 07/06/2015   Essential hypertension 07/06/2015   Chronic back pain 07/06/2015    Allergies:  Allergies  Allergen Reactions   Lisinopril Other (See Comments)    Myalgias, urinary frequency   Medications:  Current Outpatient Medications:    acetaminophen (TYLENOL) 500 MG tablet, Take 500 mg by mouth every 6 (six) hours as needed.,  Disp: , Rfl:    atorvastatin (LIPITOR) 10 MG tablet, TAKE 1 TABLET BY MOUTH DAILY. FOR CHOLESTEROL, Disp: 90 tablet, Rfl: 2   cyclobenzaprine (FLEXERIL) 5 MG tablet, Take 1 tablet (5 mg total) by mouth 2 (two) times daily as needed for muscle spasms., Disp: 14 tablet, Rfl: 0   DULoxetine (CYMBALTA) 60 MG capsule, Take 60 mg by mouth daily., Disp: , Rfl:    folic acid (FOLVITE) 1 MG tablet, Take 1  mg by mouth daily., Disp: , Rfl:    gabapentin (NEURONTIN) 600 MG tablet, TAKE 1 TABLET (600 MG TOTAL) BY MOUTH 3 (THREE) TIMES DAILY. FOR PAIN., Disp: 270 tablet, Rfl: 2   HUMIRA PEN 40 MG/0.4ML PNKT, Inject 40 mLs as directed every 7 (seven) weeks. , Disp: , Rfl:    hydrochlorothiazide (HYDRODIURIL) 12.5 MG tablet, TAKE 1 TABLET (12.5 MG TOTAL) BY MOUTH DAILY. FOR BLOOD PRESSURE., Disp: 90 tablet, Rfl: 3   metFORMIN (GLUCOPHAGE) 1000 MG tablet, Take 1 tablet (1,000 mg total) by mouth 2 (two) times daily with a meal. for diabetes., Disp: 180 tablet, Rfl: 0   methotrexate (50 MG/ML) 1 g injection, Inject 8 mg into the vein every 7 (seven) days., Disp: , Rfl:    methotrexate 250 MG/10ML injection, Inject 0.8 mLs (20 mg total) into the skin every 7 (seven) days on the same day., Disp: 30 mL, Rfl: 1   methotrexate 50 MG/2ML injection, Inject 0.8 ml weekly as directed on the same day each week.  **Discard 28 days after first use.**, Disp: 8 mL, Rfl: 1   methotrexate 50 MG/2ML injection, Inject 0.8 mLs (20 mg total) as directed once a week on the same day each week., Disp: 2 mL, Rfl: 0   olmesartan (BENICAR) 20 MG tablet, TAKE 1 TABLET (20 MG TOTAL) BY MOUTH DAILY. FOR BLOOD PRESSURE., Disp: 90 tablet, Rfl: 2   Semaglutide,0.25 or 0.5MG /DOS, (OZEMPIC, 0.25 OR 0.5 MG/DOSE,) 2 MG/3ML SOPN, Inject 0.25 mg weekly into the skin x 4 weeks, increase to 0.5 mg weekly thereafter for diabetes., Disp: 6 mL, Rfl: 0   tamsulosin (FLOMAX) 0.4 MG CAPS capsule, TAKE 2 CAPSULES (0.8 MG TOTAL) BY MOUTH DAILY. FOR URINARY FREQUNCY, Disp: 180 capsule, Rfl: 1  Observations/Objective: Patient is well-developed, well-nourished in no acute distress.  Resting comfortably  at home.  Head is normocephalic, atraumatic.  No labored breathing.  Speech is clear and coherent with logical content.  Patient is alert and oriented at baseline.  Mask in place   Assessment and Plan:   1. COVID-19  - molnupiravir EUA (LAGEVRIO) 200  mg CAPS capsule; Take 4 capsules (800 mg total) by mouth 2 (two) times daily for 5 days.  Dispense: 40 capsule; Refill: 0   - Continue OTC symptomatic management of choice - Will send OTC vitamins and supplement information through AVS - Take medication prescribed - Patient enrolled in MyChart symptom monitoring - Push fluids - Rest as needed - Discussed return precautions and when to seek in-person evaluation, sent via AVS as well   Reviewed side effects, risks and benefits of medication.   Patient acknowledged agreement and understanding of the plan.  Past Medical, Surgical, Social History, Allergies, and Medications have been Reviewed.   Follow Up Instructions: I discussed the assessment and treatment plan with the patient. The patient was provided an opportunity to ask questions and all were answered. The patient agreed with the plan and demonstrated an understanding of the instructions.  A copy of instructions were sent  to the patient via MyChart unless otherwise noted below.     The patient was advised to call back or seek an in-person evaluation if the symptoms worsen or if the condition fails to improve as anticipated.  Time:  I spent 10 minutes with the patient via telehealth technology discussing the above problems/concerns.    Freddy Finner, NP

## 2022-02-03 ENCOUNTER — Other Ambulatory Visit: Payer: Self-pay | Admitting: Primary Care

## 2022-02-03 DIAGNOSIS — E1165 Type 2 diabetes mellitus with hyperglycemia: Secondary | ICD-10-CM

## 2022-02-03 DIAGNOSIS — E785 Hyperlipidemia, unspecified: Secondary | ICD-10-CM

## 2022-02-07 ENCOUNTER — Other Ambulatory Visit (HOSPITAL_COMMUNITY): Payer: Self-pay

## 2022-02-08 ENCOUNTER — Other Ambulatory Visit (HOSPITAL_COMMUNITY): Payer: Self-pay

## 2022-02-11 ENCOUNTER — Other Ambulatory Visit: Payer: Self-pay | Admitting: Primary Care

## 2022-02-11 DIAGNOSIS — E1165 Type 2 diabetes mellitus with hyperglycemia: Secondary | ICD-10-CM

## 2022-02-14 ENCOUNTER — Other Ambulatory Visit (HOSPITAL_COMMUNITY): Payer: Self-pay

## 2022-02-15 ENCOUNTER — Other Ambulatory Visit (HOSPITAL_COMMUNITY): Payer: Self-pay

## 2022-02-16 ENCOUNTER — Other Ambulatory Visit (HOSPITAL_COMMUNITY): Payer: Self-pay

## 2022-03-15 LAB — HM DIABETES EYE EXAM

## 2022-03-27 ENCOUNTER — Other Ambulatory Visit: Payer: Self-pay | Admitting: Primary Care

## 2022-03-27 DIAGNOSIS — E1165 Type 2 diabetes mellitus with hyperglycemia: Secondary | ICD-10-CM

## 2022-04-19 ENCOUNTER — Other Ambulatory Visit: Payer: Self-pay | Admitting: Primary Care

## 2022-04-19 DIAGNOSIS — R351 Nocturia: Secondary | ICD-10-CM

## 2022-04-23 ENCOUNTER — Other Ambulatory Visit: Payer: Self-pay | Admitting: Primary Care

## 2022-04-23 DIAGNOSIS — E785 Hyperlipidemia, unspecified: Secondary | ICD-10-CM

## 2022-04-23 DIAGNOSIS — E1165 Type 2 diabetes mellitus with hyperglycemia: Secondary | ICD-10-CM

## 2022-04-23 DIAGNOSIS — I10 Essential (primary) hypertension: Secondary | ICD-10-CM

## 2022-04-24 ENCOUNTER — Ambulatory Visit (INDEPENDENT_AMBULATORY_CARE_PROVIDER_SITE_OTHER): Payer: No Typology Code available for payment source | Admitting: Podiatry

## 2022-04-24 ENCOUNTER — Ambulatory Visit (INDEPENDENT_AMBULATORY_CARE_PROVIDER_SITE_OTHER): Payer: No Typology Code available for payment source

## 2022-04-24 ENCOUNTER — Encounter: Payer: Self-pay | Admitting: Podiatry

## 2022-04-24 DIAGNOSIS — M79671 Pain in right foot: Secondary | ICD-10-CM

## 2022-04-24 DIAGNOSIS — M7751 Other enthesopathy of right foot: Secondary | ICD-10-CM

## 2022-04-24 MED ORDER — TRIAMCINOLONE ACETONIDE 10 MG/ML IJ SUSP
10.0000 mg | Freq: Once | INTRAMUSCULAR | Status: AC
  Administered 2022-04-24: 10 mg

## 2022-04-24 NOTE — Progress Notes (Signed)
Subjective:   Patient ID: Seth Larsen, male   DOB: 62 y.o.   MRN: [id number]   HPI Patient states he has had pain for years in his right ankle and foot and does walk a lot and [place].  States ankles been worse and he has had history of multiple back surgeries diabetes under good control and knee replacement with problems with the right 1.  Patient would like to be more active without pain if possible does not smoke   Review of Systems  All other systems reviewed and are negative.       Objective:  Physical Exam Vitals and nursing note reviewed.  Constitutional:      Appearance: He is well-developed.  Pulmonary:     Effort: Pulmonary effort is normal.  Musculoskeletal:        General: Normal range of motion.  Skin:    General: Skin is warm.  Neurological:     Mental Status: He is alert.     Neurovascular status     Assessment:  Intact muscle strength adequate range of motion within normal limits with patient found to have exquisite discomfort right subtalar joint into the ankle joint and into the lateral ankle gutter with reduction of motion of the subtalar joint right.  Patient does have inflammation associated with this has good digital perfusion well-oriented x 3 moderate forefoot pain history of [place] fasciitis     Plan:  Chronic pain with what hopefully is mostly within the sinus tarsi ankle and lateral ankle gutter.  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

## 2022-04-25 ENCOUNTER — Other Ambulatory Visit: Payer: Self-pay | Admitting: Podiatry

## 2022-04-25 DIAGNOSIS — M79671 Pain in right foot: Secondary | ICD-10-CM

## 2022-04-25 DIAGNOSIS — M7751 Other enthesopathy of right foot: Secondary | ICD-10-CM

## 2022-05-15 ENCOUNTER — Ambulatory Visit (INDEPENDENT_AMBULATORY_CARE_PROVIDER_SITE_OTHER): Payer: No Typology Code available for payment source | Admitting: Podiatry

## 2022-05-15 DIAGNOSIS — M19071 Primary osteoarthritis, right ankle and foot: Secondary | ICD-10-CM

## 2022-05-15 DIAGNOSIS — T148XXA Other injury of unspecified body region, initial encounter: Secondary | ICD-10-CM | POA: Diagnosis not present

## 2022-05-15 NOTE — Progress Notes (Unsigned)
R/ had injection last visit and it helped for a couple of days. Pain is back.

## 2022-05-16 NOTE — Progress Notes (Signed)
Subjective:   Patient ID: Seth Larsen, male   DOB: 62 y.o.   MRN: [id number]   HPI Patient presents stating that right foot only was better for a few days and it is really hurting again and it hurts on both the inside and the outside with the worst being on the outside of the foot.  States also the ankle gets sore   ROS      Objective:  Physical Exam  Neurovascular status intact with inflammation extending through the subtalar joint into the medial ankle that also seems to affect around the posterior tibial and then around the peroneal.     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  MRI taken to rule out tear posterior tibial arthritis subtalar joint or other ankle pathology with all questions answered today about what we may end up doing

## 2022-05-18 ENCOUNTER — Other Ambulatory Visit: Payer: Self-pay | Admitting: Primary Care

## 2022-05-18 DIAGNOSIS — I10 Essential (primary) hypertension: Secondary | ICD-10-CM

## 2022-05-18 DIAGNOSIS — E785 Hyperlipidemia, unspecified: Secondary | ICD-10-CM

## 2022-05-18 DIAGNOSIS — R351 Nocturia: Secondary | ICD-10-CM

## 2022-05-18 DIAGNOSIS — E1165 Type 2 diabetes mellitus with hyperglycemia: Secondary | ICD-10-CM

## 2022-05-19 ENCOUNTER — Ambulatory Visit (INDEPENDENT_AMBULATORY_CARE_PROVIDER_SITE_OTHER): Payer: No Typology Code available for payment source | Admitting: Primary Care

## 2022-05-19 ENCOUNTER — Encounter: Payer: Self-pay | Admitting: Primary Care

## 2022-05-19 ENCOUNTER — Encounter: Payer: Self-pay | Admitting: Podiatry

## 2022-05-19 VITALS — BP 118/72 | HR 72 | Temp 97.7°F | Ht 67.5 in | Wt 274.0 lb

## 2022-05-19 DIAGNOSIS — M79671 Pain in right foot: Secondary | ICD-10-CM

## 2022-05-19 DIAGNOSIS — Z125 Encounter for screening for malignant neoplasm of prostate: Secondary | ICD-10-CM

## 2022-05-19 DIAGNOSIS — M069 Rheumatoid arthritis, unspecified: Secondary | ICD-10-CM

## 2022-05-19 DIAGNOSIS — G4733 Obstructive sleep apnea (adult) (pediatric): Secondary | ICD-10-CM | POA: Diagnosis not present

## 2022-05-19 DIAGNOSIS — Z0000 Encounter for general adult medical examination without abnormal findings: Secondary | ICD-10-CM

## 2022-05-19 DIAGNOSIS — Z1211 Encounter for screening for malignant neoplasm of colon: Secondary | ICD-10-CM

## 2022-05-19 DIAGNOSIS — E785 Hyperlipidemia, unspecified: Secondary | ICD-10-CM | POA: Diagnosis not present

## 2022-05-19 DIAGNOSIS — I10 Essential (primary) hypertension: Secondary | ICD-10-CM | POA: Diagnosis not present

## 2022-05-19 DIAGNOSIS — M549 Dorsalgia, unspecified: Secondary | ICD-10-CM

## 2022-05-19 DIAGNOSIS — E1165 Type 2 diabetes mellitus with hyperglycemia: Secondary | ICD-10-CM | POA: Diagnosis not present

## 2022-05-19 DIAGNOSIS — R351 Nocturia: Secondary | ICD-10-CM

## 2022-05-19 DIAGNOSIS — G8929 Other chronic pain: Secondary | ICD-10-CM

## 2022-05-19 LAB — COMPREHENSIVE METABOLIC PANEL
ALT: 25 U/L (ref 0–53)
AST: 18 U/L (ref 0–37)
Albumin: 4.5 g/dL (ref 3.5–5.2)
Alkaline Phosphatase: 29 U/L — ABNORMAL LOW (ref 39–117)
BUN: 20 mg/dL (ref 6–23)
CO2: 32 mEq/L (ref 19–32)
Calcium: 9.7 mg/dL (ref 8.4–10.5)
Chloride: 99 mEq/L (ref 96–112)
Creatinine, Ser: 0.88 mg/dL (ref 0.40–1.50)
GFR: 91.44 mL/min (ref >60.00)
Glucose, Bld: 102 mg/dL — ABNORMAL HIGH (ref 70–99)
Potassium: 4.6 mEq/L (ref 3.5–5.1)
Sodium: 137 mEq/L (ref 135–145)
Total Bilirubin: 0.4 mg/dL (ref 0.2–1.2)
Total Protein: 7.1 g/dL (ref 6.0–8.3)

## 2022-05-19 LAB — LIPID PANEL
Cholesterol: 146 mg/dL (ref 0–200)
HDL: 50 mg/dL (ref >39.00)
LDL Cholesterol: 71 mg/dL (ref 0–99)
NonHDL: 95.79
Total CHOL/HDL Ratio: 3
Triglycerides: 124 mg/dL (ref 0.0–149.0)
VLDL: 24.8 mg/dL (ref 0.0–40.0)

## 2022-05-19 LAB — HEMOGLOBIN A1C: Hgb A1c MFr Bld: 6.5 % (ref 4.6–6.5)

## 2022-05-19 LAB — PSA: PSA: 0.56 ng/mL (ref 0.10–4.00)

## 2022-05-19 MED ORDER — SEMAGLUTIDE (1 MG/DOSE) 4 MG/3ML ~~LOC~~ SOPN
1.0000 mg | PEN_INJECTOR | SUBCUTANEOUS | 1 refills | Status: DC
Start: 2022-05-19 — End: 2022-08-15

## 2022-05-19 NOTE — Patient Instructions (Addendum)
Stop by the lab prior to leaving today. I will notify you of your results once received.   We increased your dose of Ozempic to 1 mg weekly for diabetes.  You will either be contacted via phone regarding your referral to GI for the colonoscopy, or you may receive a letter on your MyChart portal from our referral team with instructions for scheduling an appointment. Please let us know if you have not been contacted by anyone within two weeks.  Stop the gabapentin medication. Update me.  Please schedule a follow up visit for 6 months for a diabetes check.  It was a pleasure to see you today!

## 2022-05-19 NOTE — Assessment & Plan Note (Signed)
Following with podiatry. Pending MRI.  Reviewed office notes from April 2024.

## 2022-05-19 NOTE — Assessment & Plan Note (Signed)
Controlled.  Continue HCTZ 12.5 mg daily and olmesartan 20 mg daily. CMP pending.

## 2022-05-19 NOTE — Assessment & Plan Note (Signed)
Immunizations UTD. Colonoscopy due, referral placed. PSA due and pending.  Discussed the importance of a healthy diet and regular exercise in order for weight loss, and to reduce the risk of further co-morbidity.  Exam stable. Labs pending.  Follow up in 1 year for repeat physical.

## 2022-05-19 NOTE — Progress Notes (Signed)
Subjective:    Patient ID: Seth Larsen, male    DOB: 10/12/1960, 62 y.o.   MRN: [id number]  HPI  Seth Larsen is a very pleasant 62 y.o. male who presents today for complete physical and follow up of chronic conditions.  Immunizations: -Tetanus: Completed in 2017 -Influenza: Completed this season -Shingles: Completed Shingrix series -Pneumonia: Completed in 2017  Diet: Fair diet.  Exercise: No regular exercise.  Eye exam: Completes annually  Dental exam: Completes semi-annually    Colonoscopy: Completed Cologuard in 2021, due May 2024  PSA: Due  BP Readings from Last 3 Encounters:  05/19/22 118/72  11/17/21 128/68  05/18/21 130/68    Wt Readings from Last 3 Encounters:  05/19/22 274 lb (124.3 kg)  11/17/21 280 lb (127 kg)  05/18/21 282 lb 4 oz (128 kg)        Review of Systems  Constitutional:  Negative for unexpected weight change.  HENT:  Negative for rhinorrhea.   Respiratory:  Negative for cough and shortness of breath.   Cardiovascular:  Negative for chest pain.  Gastrointestinal:  Negative for constipation and diarrhea.  Genitourinary:  Negative for difficulty urinating.  Musculoskeletal:  Positive for arthralgias.  Skin:  Negative for rash.  Allergic/Immunologic: Negative for environmental allergies.  Neurological:  Negative for dizziness and headaches.  Psychiatric/Behavioral:  The patient is not nervous/anxious.          Past Medical History:  Diagnosis Date   Arthritis    COVID-19 virus infection 08/27/2020   Hypertension    Type 2 diabetes mellitus    URI (upper respiratory infection) 03/04/2020    Social History   Socioeconomic History   Marital status: Married    Spouse name: Not on file   Number of children: Not on file   Years of education: Not on file   Highest education level: Not on file  Occupational History   Not on file  Tobacco Use   Smoking status: Never   Smokeless tobacco: Current    Types: Chew  Substance  and Sexual Activity   Alcohol use: No    Alcohol/week: 0.0 standard drinks of alcohol   Drug use: No   Sexual activity: Not on file  Other Topics Concern   Not on file  Social History Narrative   Married.   Works at numerous occupations.    Enjoys tending to his animals.    Social Determinants of Health   Financial Resource Strain: Not on file  Food Insecurity: Not on file  Transportation Needs: Not on file  Physical Activity: Not on file  Stress: Not on file  Social Connections: Not on file  Intimate Partner Violence: Not on file    Past Surgical History:  Procedure Laterality Date   BACK SURGERY     CARPAL TUNNEL RELEASE     KNEE SURGERY     TOTAL KNEE ARTHROPLASTY      Family History  Problem Relation Age of Onset   Hypertension Father    Alzheimer's disease Father    Diabetes Brother    Hypertension Mother    Breast cancer Mother    Breast cancer Sister     Allergies  Allergen Reactions   Lisinopril Other (See Comments)    Myalgias, urinary frequency    Current Outpatient Medications on File Prior to Visit  Medication Sig Dispense Refill   acetaminophen (TYLENOL) 500 MG tablet Take 500 mg by mouth every 6 (six) hours as needed.  atorvastatin (LIPITOR) 10 MG tablet TAKE 1 TABLET BY MOUTH EVERY DAY FOR CHOLESTEROL 90 tablet 0   DULoxetine (CYMBALTA) 60 MG capsule Take 60 mg by mouth daily.     folic acid (FOLVITE) 1 MG tablet Take 1 mg by mouth daily.     gabapentin (NEURONTIN) 600 MG tablet TAKE 1 TABLET (600 MG TOTAL) BY MOUTH 3 (THREE) TIMES DAILY. FOR PAIN. 270 tablet 2   HUMIRA PEN 40 MG/0.4ML PNKT Inject 40 mLs as directed every 7 (seven) weeks.      hydrochlorothiazide (HYDRODIURIL) 12.5 MG tablet TAKE 1 TABLET (12.5 MG TOTAL) BY MOUTH DAILY. FOR BLOOD PRESSURE. 90 tablet 3   metFORMIN (GLUCOPHAGE) 1000 MG tablet TAKE 1 TABLET (1,000 MG TOTAL) BY MOUTH 2 (TWO) TIMES DAILY WITH A MEAL. FOR DIABETES. 180 tablet 0   methotrexate 50 MG/2ML injection  Inject 0.8 mLs (20 mg total) as directed once a week on the same day each week. 2 mL 0   olmesartan (BENICAR) 20 MG tablet TAKE 1 TABLET (20 MG TOTAL) BY MOUTH DAILY. FOR BLOOD PRESSURE. 90 tablet 0   tamsulosin (FLOMAX) 0.4 MG CAPS capsule TAKE 2 CAPSULES (0.8 MG TOTAL) BY MOUTH DAILY. FOR URINARY FREQUNCY 180 capsule 0   cyclobenzaprine (FLEXERIL) 5 MG tablet Take 1 tablet (5 mg total) by mouth 2 (two) times daily as needed for muscle spasms. (Patient not taking: Reported on 05/19/2022) 14 tablet 0   No current facility-administered medications on file prior to visit.    BP 118/72   Pulse 72   Temp 97.7 F (36.5 C) (Temporal)   Ht 5' 7.5" (1.715 m)   Wt 274 lb (124.3 kg)   SpO2 94%   BMI 42.28 kg/m  Objective:   Physical Exam HENT:     Right Ear: Tympanic membrane and ear canal normal.     Left Ear: Tympanic membrane and ear canal normal.     Nose: Nose normal.     Right Sinus: No maxillary sinus tenderness or frontal sinus tenderness.     Left Sinus: No maxillary sinus tenderness or frontal sinus tenderness.  Eyes:     Conjunctiva/sclera: Conjunctivae normal.  Neck:     Thyroid: No thyromegaly.     Vascular: No carotid bruit.  Cardiovascular:     Rate and Rhythm: Normal rate and regular rhythm.     Heart sounds: Normal heart sounds.  Pulmonary:     Effort: Pulmonary effort is normal.     Breath sounds: Normal breath sounds. No wheezing or rales.  Abdominal:     General: Bowel sounds are normal.     Palpations: Abdomen is soft.     Tenderness: There is no abdominal tenderness.  Musculoskeletal:        General: Normal range of motion.     Cervical back: Neck supple.  Skin:    General: Skin is warm and dry.  Neurological:     Mental Status: He is alert and oriented to person, place, and time.     Cranial Nerves: No cranial nerve deficit.     Deep Tendon Reflexes: Reflexes are normal and symmetric.  Psychiatric:        Mood and Affect: Mood normal.            Assessment & Plan:  Preventative health care Assessment & Plan: Immunizations UTD. Colonoscopy due, referral placed. PSA due and pending.  Discussed the importance of a healthy diet and regular exercise in order for weight loss, and to  reduce the risk of further co-morbidity.  Exam stable. Labs pending.  Follow up in 1 year for repeat physical.    Essential hypertension Assessment & Plan: Controlled.  Continue HCTZ 12.5 mg daily and olmesartan 20 mg daily. CMP pending.  Orders: -     Comprehensive metabolic panel  Type 2 diabetes mellitus with hyperglycemia, without long-term current use of insulin Assessment & Plan: Repeat A1C pending.  Increase Ozempic to 1 mg for weight loss benefits. Continue metformin 1000 mg BID.  Follow up in 6 months.  Orders: -     Hemoglobin A1c -     Semaglutide (1 MG/DOSE); Inject 1 mg as directed once a week. for diabetes.  Dispense: 9 mL; Refill: 1  OSA (obstructive sleep apnea) Assessment & Plan: Non compliant to CPAP machine due to limited amounts of sleep each night.   Nocturia Assessment & Plan: Improved.  Continue Flomax 0.8 mg daily. PSA pending.   Chronic pain in right foot Assessment & Plan: Following with podiatry. Pending MRI.  Reviewed office notes from April 2024.   Rheumatoid arthritis, involving unspecified site, unspecified whether rheumatoid factor present Assessment & Plan: Following with rheumatology. Reviewed office notes from June 2023.  Continue methotrexate 50 mg/2 mg, 0.8 ml weekly. He is no longer on Humira. He will update his current medication regimen.  Continue duloxetine 60 mg daily.    Chronic back pain, unspecified back location, unspecified back pain laterality Assessment & Plan: Unclear if gabapentin is helping.   Will stop gabapentin to see if this has been effective.  He will update.    Hyperlipidemia, unspecified hyperlipidemia type Assessment & Plan: Repeat lipid panel  pending.  Discussed the importance of a healthy diet and regular exercise in order for weight loss, and to reduce the risk of further co-morbidity. Continue atorvastatin 10 mg daily.   Orders: -     Lipid panel  Screening for prostate cancer -     PSA  Screening for colon cancer -     Ambulatory referral to Gastroenterology        Doreene Nest, NP

## 2022-05-19 NOTE — Assessment & Plan Note (Signed)
Repeat A1C pending.  Increase Ozempic to 1 mg for weight loss benefits. Continue metformin 1000 mg BID.  Follow up in 6 months.

## 2022-05-19 NOTE — Assessment & Plan Note (Addendum)
Following with rheumatology. Reviewed office notes from June 2023.  Continue methotrexate 50 mg/2 mg, 0.8 ml weekly. He is no longer on Humira. He will update his current medication regimen.  Continue duloxetine 60 mg daily.

## 2022-05-19 NOTE — Assessment & Plan Note (Signed)
Non compliant to CPAP machine due to limited amounts of sleep each night.

## 2022-05-19 NOTE — Assessment & Plan Note (Signed)
Improved.  Continue Flomax 0.8 mg daily. PSA pending.

## 2022-05-19 NOTE — Assessment & Plan Note (Signed)
Repeat lipid panel pending.  Discussed the importance of a healthy diet and regular exercise in order for weight loss, and to reduce the risk of further co-morbidity. Continue atorvastatin 10 mg daily.

## 2022-05-19 NOTE — Assessment & Plan Note (Signed)
Unclear if gabapentin is helping.   Will stop gabapentin to see if this has been effective.  He will update.

## 2022-05-23 ENCOUNTER — Ambulatory Visit
Admission: RE | Admit: 2022-05-23 | Discharge: 2022-05-23 | Disposition: A | Discharge status: Home / Self Care | Payer: No Typology Code available for payment source | Source: Ambulatory Visit | Attending: Podiatry | Admitting: Podiatry

## 2022-05-23 DIAGNOSIS — M19071 Primary osteoarthritis, right ankle and foot: Secondary | ICD-10-CM

## 2022-05-23 DIAGNOSIS — T148XXA Other injury of unspecified body region, initial encounter: Secondary | ICD-10-CM

## 2022-05-26 ENCOUNTER — Telehealth: Payer: Self-pay | Admitting: Podiatry

## 2022-05-26 NOTE — Telephone Encounter (Signed)
I"ve not gotten the report back from radiologist yet

## 2022-05-26 NOTE — Telephone Encounter (Signed)
Pt calling for MRI results

## 2022-05-29 NOTE — Progress Notes (Signed)
Anyone like to see this patient. Most likely scope and ligament repair

## 2022-05-30 NOTE — Progress Notes (Signed)
I agree on discussing as group. I'm going to have him come in for ankle injection and immobilization until we figure out what would be best

## 2022-05-31 ENCOUNTER — Encounter: Payer: Self-pay | Admitting: Podiatry

## 2022-05-31 ENCOUNTER — Ambulatory Visit (INDEPENDENT_AMBULATORY_CARE_PROVIDER_SITE_OTHER): Payer: No Typology Code available for payment source | Admitting: Podiatry

## 2022-05-31 DIAGNOSIS — M7751 Other enthesopathy of right foot: Secondary | ICD-10-CM

## 2022-05-31 MED ORDER — TRIAMCINOLONE ACETONIDE 10 MG/ML IJ SUSP
10.0000 mg | Freq: Once | INTRAMUSCULAR | Status: AC
Start: 2022-05-31 — End: 2022-05-31
  Administered 2022-05-31: 10 mg

## 2022-05-31 MED ORDER — DICLOFENAC SODIUM 75 MG PO TBEC: 75.0000 mg | DELAYED_RELEASE_TABLET | Freq: Two times a day (BID) | ORAL | 2 refills | Status: DC

## 2022-06-01 NOTE — Progress Notes (Signed)
Subjective:   Patient ID: Seth Larsen, male   DOB: 62 y.o.   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

## 2022-07-14 ENCOUNTER — Ambulatory Visit (INDEPENDENT_AMBULATORY_CARE_PROVIDER_SITE_OTHER): Payer: No Typology Code available for payment source | Admitting: Podiatry

## 2022-07-14 ENCOUNTER — Encounter: Payer: Self-pay | Admitting: Podiatry

## 2022-07-14 DIAGNOSIS — M7751 Other enthesopathy of right foot: Secondary | ICD-10-CM

## 2022-07-14 DIAGNOSIS — M19071 Primary osteoarthritis, right ankle and foot: Secondary | ICD-10-CM | POA: Diagnosis not present

## 2022-07-14 MED ORDER — DICLOFENAC SODIUM 75 MG PO TBEC: 75.0000 mg | DELAYED_RELEASE_TABLET | Freq: Two times a day (BID) | ORAL | 2 refills | Status: DC

## 2022-07-14 NOTE — Progress Notes (Signed)
Subjective:   Patient ID: Seth Larsen, male   DOB: 62 y.o.   MRN: [id number]   HPI Patient states it has improved and states the boot has been helpful but it has gotten torn up and I still take the medicine periodically   ROS      Objective:  Physical Exam  Neurovascular status intact with patient found to have inflammation pain of the right ankle that is feeling better than it was range of motion has improved     Assessment:  Appears to be improved from condition related to arthritis of the subtalar joint right     Plan:  H&P reviewed at great length discussed different treatment options and strategy.  At this point we can do periodic injections not today and boot can be done and worn periodically along with utilization of oral anti-inflammatories as needed.  Other than that I do not see anything else with the ultimate fusion of the subtalar joint that may be necessary and that will be approach depending on how she continues to respond

## 2022-07-15 IMAGING — DX DG CHEST 1V
1 series · 1 of 1 positions shown · non-contrast
Comparison: CT Abdomen and Pelvis 07/12/2006.

CLINICAL DATA: 59-year-old male with shortness of breath for 2
weeks. Nasal congestion.

EXAM:
CHEST  1 VIEW

[chest ap]
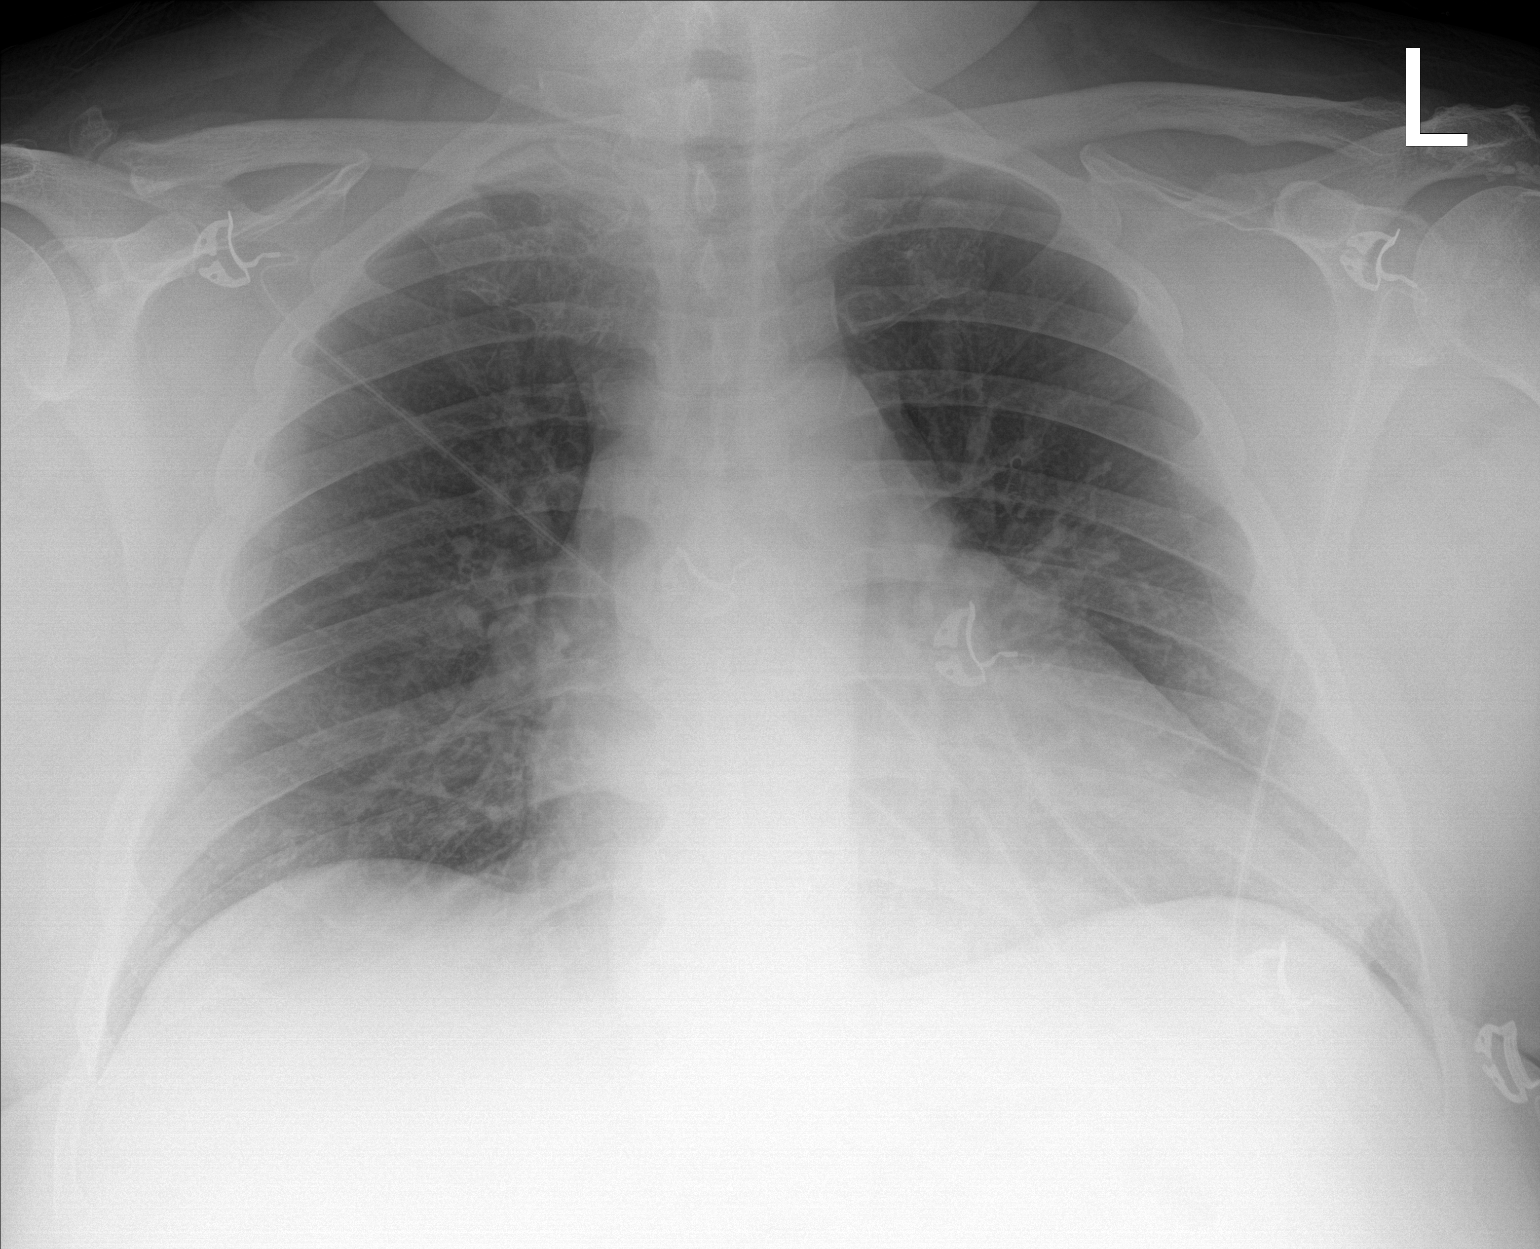

[1 of 1 positions shown; findings below may reference images not displayed]

FINDINGS: AP view at 0996 hours. Cardiac size and mediastinal contours within
normal limits. Low normal lung volumes. Visualized tracheal air
column is within normal limits. Allowing for portable technique the
lungs are clear. No pneumothorax. No acute osseous abnormality
identified. Paucity of bowel gas in the upper abdomen.
IMPRESSION: Negative portable chest.

## 2022-07-26 ENCOUNTER — Telehealth: Payer: Self-pay | Admitting: Primary Care

## 2022-07-26 DIAGNOSIS — G8929 Other chronic pain: Secondary | ICD-10-CM

## 2022-07-26 MED ORDER — GABAPENTIN 600 MG PO TABS
600.0000 mg | ORAL_TABLET | Freq: Two times a day (BID) | ORAL | 2 refills | Status: DC
Start: 2022-07-26 — End: 2023-06-14

## 2022-07-26 NOTE — Telephone Encounter (Signed)
Noted, Rx sent to pharmacy. 

## 2022-07-26 NOTE — Telephone Encounter (Signed)
Prescription Request  07/26/2022  LOV: 05/19/2022  What is the name of the medication or equipment? gabapentin (NEURONTIN) 600 MG tablet, patient has one pill left  Have you contacted your pharmacy to request a refill? Yes   Which pharmacy would you like this sent to?  [id number], [place] - [address] [place] [address] Phone: [phone] Fax: [phone]    Patient notified that their request is being sent to the clinical staff for review and that they should receive a response within 2 business days.   Please advise at Mobile [phone] (mobile)

## 2022-07-26 NOTE — Telephone Encounter (Signed)
In my notes he stopped his gabapentin and was supposed to update me if he resumed it.  When did he resume his gabapentin? How often is he taking his gabapentin?  For example once daily?  Twice daily?  3 times daily?

## 2022-07-26 NOTE — Telephone Encounter (Signed)
Patient states he resumed the gabapentin a little over a month ago. Patient is taking one 600 mg tablet twice daily.

## 2022-07-26 NOTE — Addendum Note (Signed)
Addended by: Doreene Nest on: 07/26/2022 03:13 PM   Modules accepted: Orders

## 2022-07-31 ENCOUNTER — Encounter: Payer: Self-pay | Admitting: Internal Medicine

## 2022-08-11 ENCOUNTER — Other Ambulatory Visit: Payer: Self-pay | Admitting: Primary Care

## 2022-08-11 DIAGNOSIS — I10 Essential (primary) hypertension: Secondary | ICD-10-CM

## 2022-08-14 ENCOUNTER — Telehealth: Payer: Self-pay | Admitting: Primary Care

## 2022-08-14 DIAGNOSIS — E1165 Type 2 diabetes mellitus with hyperglycemia: Secondary | ICD-10-CM

## 2022-08-14 NOTE — Telephone Encounter (Signed)
Patient is still taking ozempic, automatic fill,  Semaglutide, 1 MG/DOSE, 4 MG/3ML SOPN   Patient not seeing results with weight loss  Prefer phone call [phone]  Patient would like to discontinue, but wants to make sure it is okay with Seth Larsen  Please call him asap.patient states pharmacy is messaging him to pick up his ozempic

## 2022-08-15 MED ORDER — SEMAGLUTIDE (2 MG/DOSE) 8 MG/3ML ~~LOC~~ SOPN
2.0000 mg | PEN_INJECTOR | SUBCUTANEOUS | 0 refills | Status: DC
Start: 2022-08-15 — End: 2022-11-22

## 2022-08-15 NOTE — Telephone Encounter (Signed)
Called patient and reviewed all information. Patient verbalized understanding.  He would like to go up on the dosage to 2mg  weekly of Ozempic.  Seth Larsen

## 2022-08-15 NOTE — Telephone Encounter (Signed)
Noted, new Rx for 2 mg dose ordered and sent to pharmacy

## 2022-08-15 NOTE — Addendum Note (Signed)
Addended by: Doreene Nest on: 08/15/2022 04:21 PM   Modules accepted: Orders

## 2022-08-15 NOTE — Telephone Encounter (Signed)
Please call patient:  Does he want to try the increased dose of 2 mg weekly of Ozempic to see if this makes a difference with weight loss?

## 2022-09-06 ENCOUNTER — Ambulatory Visit (AMBULATORY_SURGERY_CENTER): Payer: No Typology Code available for payment source

## 2022-09-06 VITALS — Ht 67.5 in | Wt 270.0 lb

## 2022-09-06 DIAGNOSIS — Z1211 Encounter for screening for malignant neoplasm of colon: Secondary | ICD-10-CM

## 2022-09-06 MED ORDER — NA SULFATE-K SULFATE-MG SULF 17.5-3.13-1.6 GM/177ML PO SOLN
1.0000 | Freq: Once | ORAL | 0 refills | Status: AC
Start: 2022-09-06 — End: 2022-09-06

## 2022-09-06 NOTE — Progress Notes (Signed)

## 2022-09-18 ENCOUNTER — Other Ambulatory Visit: Payer: Self-pay | Admitting: Primary Care

## 2022-09-18 DIAGNOSIS — E785 Hyperlipidemia, unspecified: Secondary | ICD-10-CM

## 2022-09-18 DIAGNOSIS — R351 Nocturia: Secondary | ICD-10-CM

## 2022-09-18 DIAGNOSIS — E1165 Type 2 diabetes mellitus with hyperglycemia: Secondary | ICD-10-CM

## 2022-09-26 ENCOUNTER — Encounter: Payer: Self-pay | Admitting: Internal Medicine

## 2022-10-10 ENCOUNTER — Encounter: Payer: Self-pay | Admitting: Internal Medicine

## 2022-10-10 ENCOUNTER — Ambulatory Visit (AMBULATORY_SURGERY_CENTER): Payer: No Typology Code available for payment source | Admitting: Internal Medicine

## 2022-10-10 VITALS — BP 161/89 | HR 67 | Temp 97.7°F | Resp 15 | Ht 67.0 in | Wt 270.0 lb

## 2022-10-10 DIAGNOSIS — Z1211 Encounter for screening for malignant neoplasm of colon: Secondary | ICD-10-CM | POA: Diagnosis present

## 2022-10-10 DIAGNOSIS — D124 Benign neoplasm of descending colon: Secondary | ICD-10-CM | POA: Diagnosis not present

## 2022-10-10 MED ORDER — SODIUM CHLORIDE 0.9 % IV SOLN: 500.0000 mL | Freq: Once | INTRAVENOUS | Status: DC

## 2022-10-10 NOTE — Progress Notes (Signed)
HISTORY OF PRESENT ILLNESS:  Seth Larsen is a 62 y.o. male who presents for routine screening colonoscopy.  No complaints  REVIEW OF SYSTEMS:  All non-GI ROS negative except for  Past Medical History:  Diagnosis Date   Arthritis    COVID-19 virus infection 08/27/2020   Hypertension    Type 2 diabetes mellitus (HCC)    URI (upper respiratory infection) 03/04/2020    Past Surgical History:  Procedure Laterality Date   BACK SURGERY     CARPAL TUNNEL RELEASE     KNEE SURGERY     TOTAL KNEE ARTHROPLASTY      Social History AUSTUN DEBAERE  reports that he has never smoked. His smokeless tobacco use includes chew. He reports that he does not drink alcohol and does not use drugs.  family history includes Alzheimer's disease in his father; Breast cancer in his mother and sister; Diabetes in his brother; Hypertension in his father and mother.  Allergies  Allergen Reactions   Lisinopril Other (See Comments)    Myalgias, urinary frequency       PHYSICAL EXAMINATION: Vital signs: BP 132/74   Pulse 67   Temp 97.7 F (36.5 C)   Resp 13   Ht 5\' 7"  (1.702 m)   Wt 270 lb (122.5 kg)   SpO2 97%   BMI 42.29 kg/m  General: Well-developed, well-nourished, no acute distress HEENT: Sclerae are anicteric, conjunctiva pink. Oral mucosa intact Lungs: Clear Heart: Regular Abdomen: soft, nontender, nondistended, no obvious ascites, no peritoneal signs, normal bowel sounds. No organomegaly. Extremities: No edema Psychiatric: alert and oriented x3. Cooperative     ASSESSMENT:  Colon cancer screening   PLAN:   Screening colonoscopy

## 2022-10-10 NOTE — Progress Notes (Signed)
Pt's states no medical or surgical changes since previsit or office visit. 

## 2022-10-10 NOTE — Progress Notes (Signed)
Sedate, gd SR, tolerated procedure well, VSS, report to RN 

## 2022-10-10 NOTE — Patient Instructions (Addendum)
-  Handout on polyps, diverticulosis provided -await pathology results -repeat colonoscopy for surveillance recommended. Date to be determined when pathology result become available   -Continue present medications   YOU HAD AN ENDOSCOPIC PROCEDURE TODAY AT [facility]:   Refer to the procedure report that was given to you for any specific questions about what was found during the examination.  If the procedure report does not answer your questions, please call your gastroenterologist to clarify.  If you requested that your care partner not be given the details of your procedure findings, then the procedure report has been included in a sealed envelope for you to review at your convenience later.  YOU SHOULD EXPECT: Some feelings of bloating in the abdomen. Passage of more gas than usual.  Walking can help get rid of the air that was put into your GI tract during the procedure and reduce the bloating. If you had a lower endoscopy (such as a colonoscopy or flexible sigmoidoscopy) you may notice spotting of blood in your stool or on the toilet paper. If you underwent a bowel prep for your procedure, you may not have a normal bowel movement for a few days.  Please Note:  You might notice some irritation and congestion in your nose or some drainage.  This is from the oxygen used during your procedure.  There is no need for concern and it should clear up in a day or so.  SYMPTOMS TO REPORT IMMEDIATELY:  Following lower endoscopy (colonoscopy or flexible sigmoidoscopy):  Excessive amounts of blood in the stool  Significant tenderness or worsening of abdominal pains  Swelling of the abdomen that is new, acute  Fever of 100F or higher   For urgent or emergent issues, a gastroenterologist can be reached at any hour by calling [phone]. Do not use MyChart messaging for urgent concerns.    DIET:  We do recommend a small meal at first, but then you may proceed to your regular diet.   Drink plenty of fluids but you should avoid alcoholic beverages for 24 hours.  ACTIVITY:  You should plan to take it easy for the rest of today and you should NOT DRIVE or use heavy machinery until tomorrow (because of the sedation medicines used during the test).    FOLLOW UP: Our staff will call the number listed on your records the next business day following your procedure.  We will call around 7:15- 8:00 am to check on you and address any questions or concerns that you may have regarding the information given to you following your procedure. If we do not reach you, we will leave a message.     If any biopsies were taken you will be contacted by phone or by letter within the next 1-3 weeks.  Please call us at [phone] if you have not heard about the biopsies in 3 weeks.    SIGNATURES/CONFIDENTIALITY: You and/or your care partner have signed paperwork which will be entered into your electronic medical record.  These signatures attest to the fact that that the information above on your After Visit Summary has been reviewed and is understood.  Full responsibility of the confidentiality of this discharge information lies with you and/or your care-partner.

## 2022-10-10 NOTE — Op Note (Signed)
[facility] Patient Name: Seth Larsen Procedure Date: 10/10/2022 8:24 AM MRN: [id number] Endoscopist: Wilhemina Bonito. Marina Goodell , MD, [id number] Age: 62 Referring MD:  Date of Birth: [birth year] Gender: Male Account #: [id number] Procedure:                Colonoscopy with cold snare x 1 Indications:              Screening for colorectal malignant neoplasm Medicines:                Monitored Anesthesia Care Procedure:                Pre-Anesthesia Assessment:                           - Prior to the procedure, a History and Physical                            was performed, and patient medications and                            allergies were reviewed. The patient's tolerance of                            previous anesthesia was also reviewed. The risks                            and benefits of the procedure and the sedation                            options and risks were discussed with the patient.                            All questions were answered, and informed consent                            was obtained. Prior Anticoagulants: The patient has                            taken no anticoagulant or antiplatelet agents. ASA                            Grade Assessment: II - A patient with mild systemic                            disease. After reviewing the risks and benefits,                            the patient was deemed in satisfactory condition to                            undergo the procedure.                           After obtaining informed consent, the colonoscope  was passed under direct vision. Throughout the                            procedure, the patient's blood pressure, pulse, and                            oxygen saturations were monitored continuously. The                            CF [id number] #[id number] was introduced through the anus                            and advanced to the the cecum, identified by                             appendiceal orifice and ileocecal valve. The                            ileocecal valve, appendiceal orifice, and rectum                            were photographed. The quality of the bowel                            preparation was excellent. The colonoscopy was                            performed without difficulty. The patient tolerated                            the procedure well. The bowel preparation used was                            SUPREP via split dose instruction. Scope In: 8:39:34 AM Scope Out: 8:52:52 AM Scope Withdrawal Time: 0 hours 9 minutes 45 seconds  Total Procedure Duration: 0 hours 13 minutes 18 seconds  Findings:                 A 5 mm polyp was found in the descending colon. The                            polyp was removed with a cold snare. Resection and                            retrieval were complete.                           Multiple diverticula were found in the sigmoid                            colon.                           The exam was otherwise without abnormality on  direct and retroflexion views. Complications:            No immediate complications. Estimated blood loss:                            None. Estimated Blood Loss:     Estimated blood loss: none. Impression:               - One 5 mm polyp in the descending colon, removed                            with a cold snare. Resected and retrieved.                           - Diverticulosis in the sigmoid colon.                           - The examination was otherwise normal on direct                            and retroflexion views. Recommendation:           - Repeat colonoscopy in 7-10 years for surveillance.                           - Patient has a contact number available for                            emergencies. The signs and symptoms of potential                            delayed complications were discussed with the                            patient.  Return to normal activities tomorrow.                            Written discharge instructions were provided to the                            patient.                           - Resume previous diet.                           - Continue present medications.                           - Await pathology results. Wilhemina Bonito. Marina Goodell, MD 10/10/2022 9:08:40 AM This report has been signed electronically.

## 2022-10-10 NOTE — Progress Notes (Signed)
Called to room to assist during endoscopic procedure.  Patient ID and intended procedure confirmed with present staff. Received instructions for my participation in the procedure from the performing physician.  

## 2022-10-11 ENCOUNTER — Telehealth: Payer: Self-pay

## 2022-10-11 NOTE — Telephone Encounter (Signed)
  Follow up Call-     10/10/2022    7:33 AM  Call back number  Post procedure Call Back phone  # [phone]  Permission to leave phone message Yes     Patient questions:  Do you have a fever, pain , or abdominal swelling? No. Pain Score  0 *  Have you tolerated food without any problems? Yes.    Have you been able to return to your normal activities? Yes.    Do you have any questions about your discharge instructions: Diet   No. Medications  No. Follow up visit  No.  Do you have questions or concerns about your Care? No.  Actions: * If pain score is 4 or above: No action needed, pain <4.

## 2022-10-12 ENCOUNTER — Encounter: Payer: Self-pay | Admitting: Internal Medicine

## 2022-10-25 ENCOUNTER — Other Ambulatory Visit: Payer: Self-pay | Admitting: Podiatry

## 2022-11-13 ENCOUNTER — Telehealth: Payer: Self-pay | Admitting: Primary Care

## 2022-11-13 DIAGNOSIS — E1165 Type 2 diabetes mellitus with hyperglycemia: Secondary | ICD-10-CM

## 2022-11-13 MED ORDER — METFORMIN HCL 1000 MG PO TABS
1000.0000 mg | ORAL_TABLET | Freq: Two times a day (BID) | ORAL | 0 refills | Status: DC
Start: 2022-11-13 — End: 2023-02-12

## 2022-11-13 NOTE — Addendum Note (Signed)
Addended by: Doreene Nest on: 11/13/2022 04:49 PM   Modules accepted: Orders

## 2022-11-13 NOTE — Telephone Encounter (Signed)
Prescription Request  11/13/2022  LOV: 05/19/2022  What is the name of the medication or equipment? metFORMIN (GLUCOPHAGE) 1000 MG tablet   Have you contacted your pharmacy to request a refill? Yes   Which pharmacy would you like this sent to?  [id number], [place] - [address] [place] [address] Phone: [phone] Fax: [phone]    Patient notified that their request is being sent to the clinical staff for review and that they should receive a response within 2 business days.   Please advise at Mobile [phone] (mobile)

## 2022-11-13 NOTE — Telephone Encounter (Signed)
Refills sent to pharmacy. 

## 2022-11-15 ENCOUNTER — Encounter: Payer: Self-pay | Admitting: Podiatry

## 2022-11-15 ENCOUNTER — Ambulatory Visit (INDEPENDENT_AMBULATORY_CARE_PROVIDER_SITE_OTHER): Payer: No Typology Code available for payment source | Admitting: Podiatry

## 2022-11-15 VITALS — Ht 67.0 in | Wt 270.0 lb

## 2022-11-15 DIAGNOSIS — M7672 Peroneal tendinitis, left leg: Secondary | ICD-10-CM

## 2022-11-15 DIAGNOSIS — M7751 Other enthesopathy of right foot: Secondary | ICD-10-CM | POA: Diagnosis not present

## 2022-11-15 MED ORDER — TRIAMCINOLONE ACETONIDE 10 MG/ML IJ SUSP
10.0000 mg | Freq: Once | INTRAMUSCULAR | Status: AC
Start: 2022-11-15 — End: 2022-11-15
  Administered 2022-11-15: 10 mg via INTRA_ARTICULAR

## 2022-11-15 NOTE — Progress Notes (Signed)
Subjective:   Patient ID: Seth Larsen, male   DOB: 62 y.o.   MRN: [id number]   HPI Patient presents with ankle pain right that is done well for a number of months but is starting to become bothersome and on the left there is discomfort in the outside of the foot that has been sore   ROS      Objective:  Physical Exam  Neurovascular status intact inflammation pain sinus tarsi right with reduced range of motion of the ankle and the left shows pain in the peroneal tendon group near its insertion base fifth met     Assessment:  Chronic capsulitis sinus tarsi right peroneal tendinitis acute left     Plan:  H&P reviewed both conditions for the right I did sterile prep injected the capsule 3 mg Kenalog 5 mg Xylocaine for left I did do sterile prep injected the tendon after explaining risk 3 mg Dexasone Kenalog 5 mg Xylocaine advised on ice therapy reappoint as needed

## 2022-11-21 ENCOUNTER — Ambulatory Visit: Payer: No Typology Code available for payment source | Admitting: Primary Care

## 2022-11-22 ENCOUNTER — Ambulatory Visit: Payer: No Typology Code available for payment source | Admitting: Primary Care

## 2022-11-22 ENCOUNTER — Other Ambulatory Visit: Payer: Self-pay | Admitting: Primary Care

## 2022-11-22 ENCOUNTER — Encounter: Payer: Self-pay | Admitting: Primary Care

## 2022-11-22 VITALS — BP 126/64 | HR 65 | Temp 97.3°F | Ht 67.0 in | Wt 275.0 lb

## 2022-11-22 DIAGNOSIS — Z23 Encounter for immunization: Secondary | ICD-10-CM | POA: Diagnosis not present

## 2022-11-22 DIAGNOSIS — E1165 Type 2 diabetes mellitus with hyperglycemia: Secondary | ICD-10-CM

## 2022-11-22 DIAGNOSIS — Z7985 Long-term (current) use of injectable non-insulin antidiabetic drugs: Secondary | ICD-10-CM

## 2022-11-22 DIAGNOSIS — Z7984 Long term (current) use of oral hypoglycemic drugs: Secondary | ICD-10-CM | POA: Diagnosis not present

## 2022-11-22 LAB — POCT GLYCOSYLATED HEMOGLOBIN (HGB A1C): Hemoglobin A1C: 5.9 % — AB (ref 4.0–5.6)

## 2022-11-22 LAB — MICROALBUMIN / CREATININE URINE RATIO
Creatinine,U: 174.8 mg/dL
Microalb Creat Ratio: 1.8 mg/g (ref 0.0–30.0)
Microalb, Ur: 3.1 mg/dL — ABNORMAL HIGH (ref 0.0–1.9)

## 2022-11-22 MED ORDER — TIRZEPATIDE 2.5 MG/0.5ML ~~LOC~~ SOAJ: 2.5000 mg | SUBCUTANEOUS | 0 refills | Status: DC

## 2022-11-22 NOTE — Assessment & Plan Note (Addendum)
Improved and controlled with A1C of 5.9 today.  Switch to [facility] for weight loss benefits. Stop Ozempic 2 mg weekly.  Start tirzepitide Greggory Keen) for diabetes/weight loss. Start by injecting 2.5 mg into the skin once weekly for 4 weeks, then increase to 5 mg once weekly thereafter.  Continue metformin 1000 mg twice daily.  Urine microalbumin pending.  Follow-up in 6 months.

## 2022-11-22 NOTE — Telephone Encounter (Signed)
Can we complete the Prior Auth? Currently on Ozempic but would like to switch to [facility] because he has not experienced weight loss with Ozempic.  He does have type 2 diabetes.

## 2022-11-22 NOTE — Progress Notes (Signed)
Subjective:    Patient ID: Seth Larsen, male    DOB: 04-13-1960, 62 y.o.   MRN: [id number]  HPI  Seth Larsen is a very pleasant 62 y.o. male with a history of hypertension, OSA, type 2 diabetes, rheumatoid arthritis, hyperlipidemia who presents today for follow-up of diabetes.  Current medications include: Semaglutide 2 mg weekly, metformin 1000 mg twice daily. He would like to stop taking Ozempic as he's not seen weight loss. He has not made changes in his diet. Ozempic has not helped with cravings.   He is checking his blood glucose on occasion. Doesn't recall the readings.   Last A1C: 6.5 in April 2024, 5.9 today Last Eye Exam: Up-to-date Last Foot Exam: Due Pneumonia Vaccination: 2017 Urine Microalbumin: Due Statin: Atorvastatin  Dietary changes since last visit: None. Works odd hours.    Exercise: Active, no regular exercise.   BP Readings from Last 3 Encounters:  11/22/22 126/64  10/10/22 (!) 161/89  05/19/22 118/72   Wt Readings from Last 3 Encounters:  11/22/22 275 lb (124.7 kg)  11/15/22 270 lb (122.5 kg)  10/10/22 270 lb (122.5 kg)       Review of Systems  Respiratory:  Negative for shortness of breath.   Cardiovascular:  Negative for chest pain.  Neurological:  Positive for numbness. Negative for dizziness.         Past Medical History:  Diagnosis Date   Arthritis    COVID-19 virus infection 08/27/2020   Hypertension    Type 2 diabetes mellitus (HCC)    URI (upper respiratory infection) 03/04/2020    Social History   Socioeconomic History   Marital status: Married    Spouse name: Not on file   Number of children: Not on file   Years of education: Not on file   Highest education level: Not on file  Occupational History   Not on file  Tobacco Use   Smoking status: Never   Smokeless tobacco: Current    Types: Chew  Vaping Use   Vaping status: Never Used  Substance and Sexual Activity   Alcohol use: No    Alcohol/week: 0.0  standard drinks of alcohol   Drug use: No   Sexual activity: Not on file  Other Topics Concern   Not on file  Social History Narrative   Married.   Works at numerous occupations.    Enjoys tending to his animals.    Social Determinants of Health   Financial Resource Strain: Not on file  Food Insecurity: Not on file  Transportation Needs: Not on file  Physical Activity: Not on file  Stress: Not on file  Social Connections: Not on file  Intimate Partner Violence: Not on file    Past Surgical History:  Procedure Laterality Date   BACK SURGERY     CARPAL TUNNEL RELEASE     KNEE SURGERY     TOTAL KNEE ARTHROPLASTY      Family History  Problem Relation Age of Onset   Hypertension Mother    Breast cancer Mother    Hypertension Father    Alzheimer's disease Father    Breast cancer Sister    Diabetes Brother    Colon cancer Neg Hx    Colon polyps Neg Hx    Esophageal cancer Neg Hx    Rectal cancer Neg Hx    Stomach cancer Neg Hx     Allergies  Allergen Reactions   Lisinopril Other (See Comments)    Myalgias, urinary  frequency    Current Outpatient Medications on File Prior to Visit  Medication Sig Dispense Refill   acetaminophen (TYLENOL) 500 MG tablet Take 500 mg by mouth every 6 (six) hours as needed.     atorvastatin (LIPITOR) 10 MG tablet TAKE 1 TABLET BY MOUTH EVERY DAY FOR CHOLESTEROL 90 tablet 1   diclofenac (VOLTAREN) 75 MG EC tablet TAKE 1 TABLET BY MOUTH TWICE A DAY 50 tablet 2   DULoxetine (CYMBALTA) 60 MG capsule Take 60 mg by mouth daily.     folic acid (FOLVITE) 1 MG tablet Take 1 mg by mouth daily.     gabapentin (NEURONTIN) 600 MG tablet Take 1 tablet (600 mg total) by mouth 2 (two) times daily. For pain. 180 tablet 2   hydrochlorothiazide (HYDRODIURIL) 12.5 MG tablet TAKE 1 TABLET (12.5 MG TOTAL) BY MOUTH DAILY FOR BLOOD PRESSURE 90 tablet 3   HYRIMOZ 40 MG/0.4ML SOAJ Inject into the skin every 14 (fourteen) days.     metFORMIN (GLUCOPHAGE) 1000 MG  tablet Take 1 tablet (1,000 mg total) by mouth 2 (two) times daily with a meal. for diabetes. 180 tablet 0   methotrexate 50 MG/2ML injection Inject 0.8 mLs (20 mg total) as directed once a week on the same day each week. 2 mL 0   olmesartan (BENICAR) 20 MG tablet TAKE 1 TABLET (20 MG TOTAL) BY MOUTH DAILY. FOR BLOOD PRESSURE. 90 tablet 2   tamsulosin (FLOMAX) 0.4 MG CAPS capsule TAKE 2 CAPSULES (0.8 MG TOTAL) BY MOUTH DAILY. FOR URINARY FREQUNCY 180 capsule 1   diclofenac (VOLTAREN) 75 MG EC tablet Take 1 tablet (75 mg total) by mouth 2 (two) times daily. (Patient not taking: Reported on 11/22/2022) 50 tablet 2   No current facility-administered medications on file prior to visit.    BP 126/64   Pulse 65   Temp (!) 97.3 F (36.3 C) (Temporal)   Ht 5\' 7"  (1.702 m)   Wt 275 lb (124.7 kg)   SpO2 97%   BMI 43.07 kg/m  Objective:   Physical Exam Cardiovascular:     Rate and Rhythm: Normal rate and regular rhythm.  Pulmonary:     Effort: Pulmonary effort is normal.     Breath sounds: Normal breath sounds.  Musculoskeletal:     Cervical back: Neck supple.  Skin:    General: Skin is warm and dry.  Neurological:     Mental Status: He is alert and oriented to person, place, and time.  Psychiatric:        Mood and Affect: Mood normal.           Assessment & Plan:  Type 2 diabetes mellitus with hyperglycemia, without long-term current use of insulin (HCC) Assessment & Plan: Improved and controlled with A1C of 5.9 today.  Switch to [facility] for weight loss benefits. Stop Ozempic 2 mg weekly.  Start tirzepitide Greggory Keen) for diabetes/weight loss. Start by injecting 2.5 mg into the skin once weekly for 4 weeks, then increase to 5 mg once weekly thereafter.  Continue metformin 1000 mg twice daily.  Urine microalbumin pending.  Follow-up in 6 months.  Orders: -     POCT glycosylated hemoglobin (Hb A1C) -     Microalbumin / creatinine urine ratio -     Tirzepatide; Inject  2.5 mg into the skin once a week. for diabetes.  Dispense: 2 mL; Refill: 0        Doreene Nest, NP

## 2022-11-22 NOTE — Patient Instructions (Signed)
Stop Ozempic.  Start tirzepitide Greggory Keen) for diabetes/weight loss. Start by injecting 2.5 mg into the skin once weekly for 4 weeks, then increase to 5 mg once weekly thereafter. Please notify me once you've used your last 2.5 mg pen so that I can prescribe the next dose.   Continue metformin.  Please schedule a physical to meet with me in 6 months.   It was a pleasure to see you today!

## 2022-11-24 ENCOUNTER — Telehealth: Payer: Self-pay

## 2022-11-24 ENCOUNTER — Other Ambulatory Visit (HOSPITAL_COMMUNITY): Payer: Self-pay

## 2022-11-24 DIAGNOSIS — G8929 Other chronic pain: Secondary | ICD-10-CM

## 2022-11-24 NOTE — Telephone Encounter (Signed)
Noted, Rx signed.

## 2022-11-24 NOTE — Telephone Encounter (Signed)
 PA has been submitted and documented in separate encounter, please sign off on rx in this encounter as PA team is unable to resolve RX requests. Thank you

## 2022-11-24 NOTE — Telephone Encounter (Signed)
Pharmacy Patient Advocate Encounter   Received notification from RX Request Messages that prior authorization for Mounjaro 2.5MG /0.5ML auto-injectors is required/requested.   Insurance verification completed.   The patient is insured through [facility] .   Per test claim: PA required; PA started via CoverMyMeds. KEY [id number] . Waiting for clinical questions to populate.

## 2022-11-30 ENCOUNTER — Ambulatory Visit: Payer: No Typology Code available for payment source | Admitting: Podiatry

## 2022-11-30 ENCOUNTER — Encounter: Payer: Self-pay | Admitting: Podiatry

## 2022-11-30 DIAGNOSIS — M7751 Other enthesopathy of right foot: Secondary | ICD-10-CM

## 2022-11-30 DIAGNOSIS — M7672 Peroneal tendinitis, left leg: Secondary | ICD-10-CM | POA: Diagnosis not present

## 2022-12-01 NOTE — Progress Notes (Signed)
Subjective:   Patient ID: Seth Larsen, male   DOB: 62 y.o.   MRN: [id number]   HPI Patient states I did not get relief from the last injections and it seems like it is in different areas   ROS      Objective:  Physical Exam  Neurovascular status intact with the evaluation showing no pain currently in the sinus tarsi but I did note pain in the right medial and central ankle and the left around the peroneal tendon which may be compensatory or may be primary     Assessment:  Ankle inflammatory condition right with peroneal tendinitis left with resolved sinus tarsitis currently     Plan:  H&P reviewed for the right I did do sterile prep I injected the ankle medial side 3 mg Kenalog 5 mg Xylocaine and I injected the peroneal tendon left 3 mg dexamethasone Kenalog 5 mg Xylocaine after explaining risk.  Patient will be seen back hopefully this will give him for 5 months relief

## 2022-12-04 ENCOUNTER — Other Ambulatory Visit (HOSPITAL_COMMUNITY): Payer: Self-pay

## 2022-12-05 ENCOUNTER — Other Ambulatory Visit (HOSPITAL_COMMUNITY): Payer: Self-pay

## 2022-12-05 ENCOUNTER — Telehealth: Payer: Self-pay | Admitting: Primary Care

## 2022-12-05 NOTE — Telephone Encounter (Signed)
Called and notified patient. Scheduled patient appt 10/20 @ 7:20.  Patient states he is already taking diclofenac twice daily since his foot doctor prescribed and it is not helping.

## 2022-12-05 NOTE — Telephone Encounter (Signed)
Correct, patient needs office visit.  He could try taking the diclofenac medication twice daily that the foot doctor provided him.  Not sure if he is already doing so.  Happy to see him at his convenience.

## 2022-12-05 NOTE — Telephone Encounter (Signed)
Noted.  Will evaluate. 

## 2022-12-05 NOTE — Telephone Encounter (Signed)
Patient visited the office to leave a message for Jae Dire. States he has been having some sciatic nerve pain that is radiating down to his left foot. States this has been ongoing for around four days, pt says he has seen Jae Dire recently for a follow up. Patient wanted to know if any medications could be sent in for him? Advised pt that Jae Dire would likely want to see him for an examination before prescribing anything, pt asked if Jae Dire could make that determination. Please advise pt if needed at mobile number

## 2022-12-06 ENCOUNTER — Other Ambulatory Visit (HOSPITAL_COMMUNITY): Payer: Self-pay

## 2022-12-06 ENCOUNTER — Encounter: Payer: Self-pay | Admitting: Primary Care

## 2022-12-06 ENCOUNTER — Ambulatory Visit: Payer: No Typology Code available for payment source | Admitting: Primary Care

## 2022-12-06 VITALS — BP 142/78 | HR 78 | Temp 98.2°F | Ht 67.0 in | Wt 278.0 lb

## 2022-12-06 DIAGNOSIS — G8929 Other chronic pain: Secondary | ICD-10-CM | POA: Diagnosis not present

## 2022-12-06 DIAGNOSIS — M549 Dorsalgia, unspecified: Secondary | ICD-10-CM | POA: Diagnosis not present

## 2022-12-06 DIAGNOSIS — M545 Low back pain, unspecified: Secondary | ICD-10-CM

## 2022-12-06 HISTORY — DX: Other chronic pain: G89.29

## 2022-12-06 HISTORY — DX: Low back pain, unspecified: M54.50

## 2022-12-06 MED ORDER — PREDNISONE 20 MG PO TABS
ORAL_TABLET | ORAL | 0 refills | Status: DC
Start: 2022-12-06 — End: 2023-03-26

## 2022-12-06 MED ORDER — CYCLOBENZAPRINE HCL 5 MG PO TABS: 5.0000 mg | ORAL_TABLET | Freq: Three times a day (TID) | ORAL | 0 refills | Status: DC | PRN

## 2022-12-06 MED ORDER — METHYLPREDNISOLONE ACETATE 80 MG/ML IJ SUSP
80.0000 mg | Freq: Once | INTRAMUSCULAR | Status: AC
Start: 2022-12-06 — End: 2022-12-06
  Administered 2022-12-06: 80 mg via INTRAMUSCULAR

## 2022-12-06 MED ORDER — DULOXETINE HCL 30 MG PO CPEP
30.0000 mg | ORAL_CAPSULE | Freq: Every day | ORAL | 0 refills | Status: DC
Start: 2022-12-06 — End: 2023-05-23

## 2022-12-06 NOTE — Assessment & Plan Note (Signed)
Will resume Cymbalta at 30 mg 7-14 days, then increase to 60 mg thereafter if needed.  He will update.  Continue gabapentin 600 mg BID. Stop diclofenac.   Use cyclobenzaprine PRN.

## 2022-12-06 NOTE — Progress Notes (Signed)
Subjective:    Patient ID: Seth Larsen, male    DOB: [birth year], 62 y.o.   MRN: [id number]  Back Pain Associated symptoms include numbness. Pertinent negatives include no weakness.    Seth Larsen is a very pleasant 62 y.o. male with a history of hypertension, OSA, type 2 diabetes, rheumatoid arthritis, chronic back pain who presents today to discuss back pain.  Symptom onset 1 week ago with left lower lumbar pain with radiation down the left lateral lower extremity to the ankle. Evaluated by podiatry on 11/30/22, received an injection to the right and left ankle and was placed in a boot to the left foot. The following day after after wearing the boot he began noticing his back and left lower extremity pain.  Long history of chronic back pain since his 34s. Typically he takes muscle relaxer medication and pain medication which helps. He's recently been taking hydrocodone once daily from an older prescription which helped some. He's also been taking Tylenol and diclofenac 75 mg BID which hasn't helped. He's been taking Cymbalta 60 mg "as needed" which has caused him to feel jittery each time he takes.   He denies loss of bowel/bladder control, numbness in the groin, injury/trauma, lower extremity weakness.    Review of Systems  Musculoskeletal:  Positive for back pain.  Skin:  Negative for color change.  Neurological:  Positive for numbness. Negative for weakness.         Past Medical History:  Diagnosis Date   Arthritis    COVID-19 virus infection 08/27/2020   Hypertension    Type 2 diabetes mellitus (HCC)    URI (upper respiratory infection) 03/04/2020    Social History   Socioeconomic History   Marital status: Married    Spouse name: Not on file   Number of children: Not on file   Years of education: Not on file   Highest education level: Not on file  Occupational History   Not on file  Tobacco Use   Smoking status: Never   Smokeless tobacco: Current    Types:  Chew  Vaping Use   Vaping status: Never Used  Substance and Sexual Activity   Alcohol use: No    Alcohol/week: 0.0 standard drinks of alcohol   Drug use: No   Sexual activity: Not on file  Other Topics Concern   Not on file  Social History Narrative   Married.   Works at numerous occupations.    Enjoys tending to his animals.    Social Determinants of Health   Financial Resource Strain: Not on file  Food Insecurity: Not on file  Transportation Needs: Not on file  Physical Activity: Not on file  Stress: Not on file  Social Connections: Not on file  Intimate Partner Violence: Not on file    Past Surgical History:  Procedure Laterality Date   BACK SURGERY     CARPAL TUNNEL RELEASE     KNEE SURGERY     TOTAL KNEE ARTHROPLASTY      Family History  Problem Relation Age of Onset   Hypertension Mother    Breast cancer Mother    Hypertension Father    Alzheimer's disease Father    Breast cancer Sister    Diabetes Brother    Colon cancer Neg Hx    Colon polyps Neg Hx    Esophageal cancer Neg Hx    Rectal cancer Neg Hx    Stomach cancer Neg Hx     Allergies  Allergen Reactions   Lisinopril Other (See Comments)    Myalgias, urinary frequency    Current Outpatient Medications on File Prior to Visit  Medication Sig Dispense Refill   acetaminophen (TYLENOL) 500 MG tablet Take 500 mg by mouth every 6 (six) hours as needed.     atorvastatin (LIPITOR) 10 MG tablet TAKE 1 TABLET BY MOUTH EVERY DAY FOR CHOLESTEROL 90 tablet 1   diclofenac (VOLTAREN) 75 MG EC tablet Take 1 tablet (75 mg total) by mouth 2 (two) times daily. 50 tablet 2   diclofenac (VOLTAREN) 75 MG EC tablet TAKE 1 TABLET BY MOUTH TWICE A DAY 50 tablet 2   DULoxetine (CYMBALTA) 60 MG capsule Take 60 mg by mouth daily.     folic acid (FOLVITE) 1 MG tablet Take 1 mg by mouth daily.     gabapentin (NEURONTIN) 600 MG tablet Take 1 tablet (600 mg total) by mouth 2 (two) times daily. For pain. 180 tablet 2    hydrochlorothiazide (HYDRODIURIL) 12.5 MG tablet TAKE 1 TABLET (12.5 MG TOTAL) BY MOUTH DAILY FOR BLOOD PRESSURE 90 tablet 3   HYRIMOZ 40 MG/0.4ML SOAJ Inject into the skin every 14 (fourteen) days.     metFORMIN (GLUCOPHAGE) 1000 MG tablet Take 1 tablet (1,000 mg total) by mouth 2 (two) times daily with a meal. for diabetes. 180 tablet 0   methotrexate 50 MG/2ML injection Inject 0.8 mLs (20 mg total) as directed once a week on the same day each week. 2 mL 0   olmesartan (BENICAR) 20 MG tablet TAKE 1 TABLET (20 MG TOTAL) BY MOUTH DAILY. FOR BLOOD PRESSURE. 90 tablet 2   tamsulosin (FLOMAX) 0.4 MG CAPS capsule TAKE 2 CAPSULES (0.8 MG TOTAL) BY MOUTH DAILY. FOR URINARY FREQUNCY 180 capsule 1   tirzepatide (MOUNJARO) 2.5 MG/0.5ML Pen INJECT 2.5 MG INTO THE SKIN ONCE A WEEK. FOR DIABETES. 2 mL 0   No current facility-administered medications on file prior to visit.    BP (!) 142/78   Pulse 78   Temp 98.2 F (36.8 C) (Temporal)   Ht 5\' 7"  (1.702 m)   Wt 278 lb (126.1 kg)   SpO2 98%   BMI 43.54 kg/m  Objective:   Physical Exam Cardiovascular:     Rate and Rhythm: Normal rate and regular rhythm.  Pulmonary:     Effort: Pulmonary effort is normal.     Breath sounds: Normal breath sounds.  Musculoskeletal:     Cervical back: Neck supple.  Skin:    General: Skin is warm and dry.  Neurological:     Mental Status: He is alert and oriented to person, place, and time.  Psychiatric:        Mood and Affect: Mood normal.           Assessment & Plan:  Acute on chronic low back pain Assessment & Plan: With sciatica.  No alarm signs on exam or HPI.  Depo medrol 80 mg provided IM today. Rx for cyclobenzaprine 5 mg TID PRN provided. Drowsiness precautions provided.  Start prednisone. Take 3 tablets my mouth once daily in the morning for 3 days, then 2 tablets for 3 days, then 1 tablet for 3 days. Stop diclofenac.   Will resume Cymbalta at 30 mg daily x 7-14 days, then increase to 60  mg thereafter.   Close follow up PRN.  Orders: -     predniSONE; Take 3 tablets my mouth once daily in the morning for 3 days, then 2 tablets for  3 days, then 1 tablet for 3 days.  Dispense: 18 tablet; Refill: 0  Chronic back pain, unspecified back location, unspecified back pain laterality Assessment & Plan: Will resume Cymbalta at 30 mg 7-14 days, then increase to 60 mg thereafter if needed.  He will update.  Continue gabapentin 600 mg BID. Stop diclofenac.   Use cyclobenzaprine PRN.  Orders: -     Cyclobenzaprine HCl; Take 1 tablet (5 mg total) by mouth 3 (three) times daily as needed.  Dispense: 30 tablet; Refill: 0        Doreene Nest, NP

## 2022-12-06 NOTE — Telephone Encounter (Signed)
Pharmacy Patient Advocate Encounter   Received notification from CoverMyMeds that prior authorization for Mounjaro 2.5 is required/requested.   Insurance verification completed.   The patient is insured through [facility] .   Per test claim: PA required; PA submitted to above mentioned insurance via Prompt PA Key/confirmation #/EOC [id number] Status is pending

## 2022-12-06 NOTE — Patient Instructions (Signed)
Start prednisone tomorrow morning. Take 3 tablets my mouth once daily in the morning for 3 days, then 2 tablets for 3 days, then 1 tablet for 3 days.  You may take cyclobenzaprine muscle relaxer every 8 hours as needed. Drowsiness precautions provided.   Stop taking diclofenac for pain.  Start duloxetine (Cymbalta) 30 mg daily for 7-14 days, then increase to the 60 mg dose at home.  We will check on the [facility].   It was a pleasure to see you today!

## 2022-12-06 NOTE — Telephone Encounter (Signed)
PA is pending-we have to call the patients plan  PA request has been Submitted. New Encounter created for follow up. For additional info see Pharmacy Prior Auth telephone encounter from 10/17.

## 2022-12-06 NOTE — Assessment & Plan Note (Addendum)
With sciatica.  No alarm signs on exam or HPI.  Depo medrol 80 mg provided IM today. Rx for cyclobenzaprine 5 mg TID PRN provided. Drowsiness precautions provided.  Start prednisone. Take 3 tablets my mouth once daily in the morning for 3 days, then 2 tablets for 3 days, then 1 tablet for 3 days. Stop diclofenac.   Will resume Cymbalta at 30 mg daily x 7-14 days, then increase to 60 mg thereafter.   Close follow up PRN.

## 2022-12-06 NOTE — Telephone Encounter (Signed)
Please submit PA for [place]

## 2022-12-06 NOTE — Telephone Encounter (Signed)
Al Decant,  I've pasted your questions below.   "Has patient had an 2 hour plasma glucose test, history of hyperglycemic crisis and a random plasma glucose, fasting plasma glucose test? We will need labs for those to continue request"  Does he need all of these labs to continue, or just one or a few?  Please respond by creating a note. Thanks!  Mayra Reel, NP-C

## 2022-12-06 NOTE — Addendum Note (Signed)
Addended by: Lonia Blood on: 12/06/2022 08:06 AM   Modules accepted: Orders

## 2022-12-07 NOTE — Telephone Encounter (Signed)
I have also attached the PA form in the patients media if you would like take a look.

## 2022-12-07 NOTE — Telephone Encounter (Signed)
It asks if patient has had each lab test, so I'm not sure if ALL tests have to be done or if just one of has to be done for an approval to be obtained. Since it asks for each test separately I am assuming they want each to be done.

## 2022-12-08 MED ORDER — TRAMADOL HCL 50 MG PO TABS
50.0000 mg | ORAL_TABLET | Freq: Three times a day (TID) | ORAL | 0 refills | Status: DC | PRN
Start: 2022-12-08 — End: 2022-12-13

## 2022-12-11 ENCOUNTER — Telehealth: Payer: Self-pay | Admitting: Primary Care

## 2022-12-11 ENCOUNTER — Encounter: Payer: Self-pay | Admitting: *Deleted

## 2022-12-11 DIAGNOSIS — G8929 Other chronic pain: Secondary | ICD-10-CM

## 2022-12-11 NOTE — Addendum Note (Signed)
Addended by: Doreene Nest on: 12/11/2022 07:05 PM   Modules accepted: Orders

## 2022-12-11 NOTE — Telephone Encounter (Signed)
Pt wife called in and stated that she needs a call back soon as possible pt needs a referral  to Neurosurgery pt is in a lot of pain wife can be reached at [phone].

## 2022-12-11 NOTE — Telephone Encounter (Signed)
Called and spoke with patients wife, she is very concerned about patient. He is in a lot of pain from the lower back/sciatica pain. Patient is taking Prednisone, Flexeril and tramadol without any relief.  They are requesting an urgent referral be placed for [facility] Neurosurgery.

## 2022-12-11 NOTE — Telephone Encounter (Signed)
It looks like the referral coordinator sent him a letter on MyChart today for scheduling. It was approved for Dr. Lonie Peak office. I will change the referral to a "stat" referral.  Will you notify the referrals pool that we changed it to stat? Also let the patient know what's going on.

## 2022-12-12 MED ORDER — HYDROCODONE-ACETAMINOPHEN 5-325 MG PO TABS: 1.0000 | ORAL_TABLET | Freq: Three times a day (TID) | ORAL | 0 refills | Status: AC | PRN

## 2022-12-12 NOTE — Telephone Encounter (Signed)
Referral team notified that status was changed.

## 2022-12-12 NOTE — Telephone Encounter (Signed)
Noted  

## 2022-12-15 ENCOUNTER — Ambulatory Visit
Admission: RE | Admit: 2022-12-15 | Discharge: 2022-12-15 | Disposition: A | Discharge status: Home / Self Care | Payer: No Typology Code available for payment source | Source: Ambulatory Visit | Attending: Primary Care | Admitting: Primary Care

## 2022-12-15 ENCOUNTER — Telehealth: Payer: Self-pay | Admitting: Primary Care

## 2022-12-15 DIAGNOSIS — M545 Low back pain, unspecified: Secondary | ICD-10-CM | POA: Insufficient documentation

## 2022-12-15 DIAGNOSIS — G8929 Other chronic pain: Secondary | ICD-10-CM | POA: Diagnosis present

## 2022-12-15 NOTE — Telephone Encounter (Signed)
Patient wife called in and some questions regarding his MRI. She stated that it keeps getting denied and at this point they would rather pay for it if need be. She can be reached at [phone]. Thank you!

## 2022-12-15 NOTE — Telephone Encounter (Signed)
Spoke with patients wife Jasmine December, she is aware of the approval and is calling to schedule the MRI - hopefully they can work him in today.

## 2022-12-15 NOTE — Telephone Encounter (Signed)
Approved  Auth# [id number]  PCP Vernona Rieger  Location: [facility]  Approved 12/12/2022  Expires   05/07/2023

## 2022-12-18 DIAGNOSIS — G8929 Other chronic pain: Secondary | ICD-10-CM

## 2022-12-20 ENCOUNTER — Other Ambulatory Visit: Payer: Self-pay | Admitting: Primary Care

## 2022-12-20 DIAGNOSIS — G8929 Other chronic pain: Secondary | ICD-10-CM

## 2022-12-22 ENCOUNTER — Other Ambulatory Visit (HOSPITAL_COMMUNITY): Payer: Self-pay

## 2022-12-25 ENCOUNTER — Other Ambulatory Visit: Payer: Self-pay | Admitting: Primary Care

## 2022-12-25 DIAGNOSIS — E1165 Type 2 diabetes mellitus with hyperglycemia: Secondary | ICD-10-CM

## 2022-12-25 MED ORDER — HYDROCODONE-ACETAMINOPHEN 5-325 MG PO TABS: 1.0000 | ORAL_TABLET | Freq: Four times a day (QID) | ORAL | 0 refills | Status: DC | PRN

## 2023-01-09 ENCOUNTER — Telehealth: Payer: Self-pay | Admitting: Primary Care

## 2023-01-09 NOTE — Telephone Encounter (Signed)
Patient says that due to his new insurance, he will only be able to get medication Semaglutide, 2 MG/DOSE, (OZEMPIC, 2 MG/DOSE,) 8 MG/3ML SOPN in 1 syringe per 28 days. Anything more will not be covered, placed a copy of letter patient received in Kate's box

## 2023-01-10 NOTE — Telephone Encounter (Signed)
Called and spoke with patient, advised of Seth Larsen message. Nothing is needed at this time.

## 2023-01-10 NOTE — Telephone Encounter (Signed)
Noted. Looks like we set a 90 day supply on 12/25/22. The pharmacy should be able to run each month per refill.   Is there anything he needs for [place] to do now?

## 2023-01-10 NOTE — Telephone Encounter (Signed)
Noted  

## 2023-01-26 ENCOUNTER — Ambulatory Visit: Payer: PRIVATE HEALTH INSURANCE | Admitting: Podiatry

## 2023-02-02 ENCOUNTER — Ambulatory Visit: Payer: PRIVATE HEALTH INSURANCE | Admitting: Podiatry

## 2023-02-07 ENCOUNTER — Other Ambulatory Visit: Payer: Self-pay | Admitting: Podiatry

## 2023-02-12 ENCOUNTER — Other Ambulatory Visit: Payer: Self-pay | Admitting: Primary Care

## 2023-02-12 DIAGNOSIS — E1165 Type 2 diabetes mellitus with hyperglycemia: Secondary | ICD-10-CM

## 2023-02-28 ENCOUNTER — Other Ambulatory Visit: Payer: Self-pay | Admitting: Primary Care

## 2023-02-28 DIAGNOSIS — E785 Hyperlipidemia, unspecified: Secondary | ICD-10-CM

## 2023-02-28 DIAGNOSIS — R351 Nocturia: Secondary | ICD-10-CM

## 2023-02-28 DIAGNOSIS — I10 Essential (primary) hypertension: Secondary | ICD-10-CM

## 2023-03-22 LAB — HM DIABETES EYE EXAM

## 2023-03-26 ENCOUNTER — Emergency Department (HOSPITAL_BASED_OUTPATIENT_CLINIC_OR_DEPARTMENT_OTHER)
Admission: EM | Admit: 2023-03-26 | Discharge: 2023-03-26 | Disposition: A | Discharge status: Home / Self Care | Payer: PRIVATE HEALTH INSURANCE | Attending: Emergency Medicine | Admitting: Emergency Medicine

## 2023-03-26 ENCOUNTER — Other Ambulatory Visit: Payer: Self-pay

## 2023-03-26 ENCOUNTER — Ambulatory Visit: Payer: PRIVATE HEALTH INSURANCE | Admitting: General Practice

## 2023-03-26 ENCOUNTER — Emergency Department (HOSPITAL_BASED_OUTPATIENT_CLINIC_OR_DEPARTMENT_OTHER): Payer: PRIVATE HEALTH INSURANCE | Admitting: Radiology

## 2023-03-26 ENCOUNTER — Encounter: Payer: Self-pay | Admitting: General Practice

## 2023-03-26 ENCOUNTER — Encounter (HOSPITAL_BASED_OUTPATIENT_CLINIC_OR_DEPARTMENT_OTHER): Payer: Self-pay | Admitting: *Deleted

## 2023-03-26 ENCOUNTER — Emergency Department (HOSPITAL_BASED_OUTPATIENT_CLINIC_OR_DEPARTMENT_OTHER): Payer: PRIVATE HEALTH INSURANCE

## 2023-03-26 VITALS — BP 80/40 | HR 85 | Temp 97.7°F | Wt 263.0 lb

## 2023-03-26 DIAGNOSIS — I10 Essential (primary) hypertension: Secondary | ICD-10-CM | POA: Insufficient documentation

## 2023-03-26 DIAGNOSIS — R631 Polydipsia: Secondary | ICD-10-CM

## 2023-03-26 DIAGNOSIS — N410 Acute prostatitis: Secondary | ICD-10-CM | POA: Insufficient documentation

## 2023-03-26 DIAGNOSIS — R0609 Other forms of dyspnea: Secondary | ICD-10-CM | POA: Insufficient documentation

## 2023-03-26 DIAGNOSIS — D72829 Elevated white blood cell count, unspecified: Secondary | ICD-10-CM | POA: Insufficient documentation

## 2023-03-26 DIAGNOSIS — Z79899 Other long term (current) drug therapy: Secondary | ICD-10-CM | POA: Insufficient documentation

## 2023-03-26 DIAGNOSIS — E119 Type 2 diabetes mellitus without complications: Secondary | ICD-10-CM | POA: Diagnosis not present

## 2023-03-26 DIAGNOSIS — Z7984 Long term (current) use of oral hypoglycemic drugs: Secondary | ICD-10-CM | POA: Insufficient documentation

## 2023-03-26 DIAGNOSIS — E861 Hypovolemia: Secondary | ICD-10-CM | POA: Insufficient documentation

## 2023-03-26 DIAGNOSIS — N179 Acute kidney failure, unspecified: Secondary | ICD-10-CM | POA: Diagnosis not present

## 2023-03-26 DIAGNOSIS — R3915 Urgency of urination: Secondary | ICD-10-CM | POA: Insufficient documentation

## 2023-03-26 DIAGNOSIS — R5383 Other fatigue: Secondary | ICD-10-CM | POA: Diagnosis present

## 2023-03-26 HISTORY — DX: Polydipsia: R63.1

## 2023-03-26 LAB — POC URINALSYSI DIPSTICK (AUTOMATED)
Bilirubin, UA: 1
Blood, UA: 10
Glucose, UA: NEGATIVE
Ketones, UA: POSITIVE
Nitrite, UA: NEGATIVE
Protein, UA: POSITIVE — AB
Spec Grav, UA: >=1.03 — AB (ref 1.010–1.025)
Urobilinogen, UA: 0.2 U/dL
pH, UA: 5 (ref 5.0–8.0)

## 2023-03-26 LAB — TROPONIN I (HIGH SENSITIVITY): Troponin I (High Sensitivity): 6 ng/L (ref <18)

## 2023-03-26 LAB — CBC
HCT: 38.5 % — ABNORMAL LOW (ref 39.0–52.0)
Hemoglobin: 13 g/dL (ref 13.0–17.0)
MCH: 31.2 pg (ref 26.0–34.0)
MCHC: 33.8 g/dL (ref 30.0–36.0)
MCV: 92.3 fL (ref 80.0–100.0)
Platelets: 246 10*3/uL (ref 150–400)
RBC: 4.17 MIL/uL — ABNORMAL LOW (ref 4.22–5.81)
RDW: 14 % (ref 11.5–15.5)
WBC: 21.7 10*3/uL — ABNORMAL HIGH (ref 4.0–10.5)
nRBC: 0 % (ref 0.0–0.2)

## 2023-03-26 LAB — URINALYSIS, ROUTINE W REFLEX MICROSCOPIC
Bacteria, UA: NONE SEEN
Bilirubin Urine: NEGATIVE
Glucose, UA: NEGATIVE mg/dL
Ketones, ur: NEGATIVE mg/dL
Leukocytes,Ua: NEGATIVE
Nitrite: NEGATIVE
Protein, ur: 30 mg/dL — AB
Specific Gravity, Urine: 1.023 (ref 1.005–1.030)
pH: 5.5 (ref 5.0–8.0)

## 2023-03-26 LAB — HEPATIC FUNCTION PANEL
ALT: 41 U/L (ref 0–44)
AST: 18 U/L (ref 15–41)
Albumin: 3.8 g/dL (ref 3.5–5.0)
Alkaline Phosphatase: 25 U/L — ABNORMAL LOW (ref 38–126)
Bilirubin, Direct: 0.2 mg/dL (ref 0.0–0.2)
Indirect Bilirubin: 0.5 mg/dL (ref 0.3–0.9)
Total Bilirubin: 0.7 mg/dL (ref 0.0–1.2)
Total Protein: 7.2 g/dL (ref 6.5–8.1)

## 2023-03-26 LAB — BASIC METABOLIC PANEL
Anion gap: 11 (ref 5–15)
BUN: 36 mg/dL — ABNORMAL HIGH (ref 8–23)
CO2: 26 mmol/L (ref 22–32)
Calcium: 9.2 mg/dL (ref 8.9–10.3)
Chloride: 96 mmol/L — ABNORMAL LOW (ref 98–111)
Creatinine, Ser: 1.06 mg/dL (ref 0.61–1.24)
GFR, Estimated: >60 mL/min (ref >60)
Glucose, Bld: 108 mg/dL — ABNORMAL HIGH (ref 70–99)
Potassium: 3.9 mmol/L (ref 3.5–5.1)
Sodium: 133 mmol/L — ABNORMAL LOW (ref 135–145)

## 2023-03-26 LAB — CBG MONITORING, ED: Glucose-Capillary: 84 mg/dL (ref 70–99)

## 2023-03-26 LAB — LACTIC ACID, PLASMA: Lactic Acid, Venous: 0.8 mmol/L (ref 0.5–1.9)

## 2023-03-26 LAB — RESP PANEL BY RT-PCR (RSV, FLU A&B, COVID)  RVPGX2
Influenza A by PCR: NEGATIVE
Influenza B by PCR: NEGATIVE
Resp Syncytial Virus by PCR: NEGATIVE
SARS Coronavirus 2 by RT PCR: NEGATIVE

## 2023-03-26 MED ORDER — SODIUM CHLORIDE 0.9 % IV SOLN
1.0000 g | Freq: Once | INTRAVENOUS | Status: AC
  Administered 2023-03-26: 1 g via INTRAVENOUS
  Filled 2023-03-26: qty 10

## 2023-03-26 MED ORDER — LACTATED RINGERS IV BOLUS
1000.0000 mL | Freq: Once | INTRAVENOUS | Status: AC
  Administered 2023-03-26: 1000 mL via INTRAVENOUS

## 2023-03-26 MED ORDER — CIPROFLOXACIN HCL 500 MG PO TABS: 500.0000 mg | ORAL_TABLET | Freq: Two times a day (BID) | ORAL | 0 refills | Status: AC

## 2023-03-26 MED ORDER — IOHEXOL 300 MG/ML  SOLN
100.0000 mL | Freq: Once | INTRAMUSCULAR | Status: AC | PRN
Start: 2023-03-26 — End: 2023-03-26
  Administered 2023-03-26: 100 mL via INTRAVENOUS

## 2023-03-26 NOTE — ED Provider Notes (Signed)
 [place] EMERGENCY DEPARTMENT AT [facility] Provider Note   CSN: [id number] Arrival date & time: 03/26/23  1702     History  Chief Complaint  Patient presents with   Illness    Seth Larsen is a 63 y.o. male.  Patient is a 63 year old male with a history of hypertension, diabetes, arthritis on hyrimoz (biologic) who is presenting today with multiple vague symptoms over the last 2-1/2 to 3 weeks.  Patient reports he is just felt unwell.  He has had malaise, fatigue.  This has been waxing and waning but more pronounced since 13-Apr-2023.  He reports last week he had symptoms of chills and sweats but his wife took his temperature multiple times and reported he never had a fever.  He has had intermittent low back pain but reports a history of chronic back pain and nothing that is been persistent.  More recently in the last few days he is also had urinary urgency and frequency but denies any dysuria.  He has not had any diarrhea and reports normal bowel movements.  He feels that he has had a mild decreased appetite but is still been eating and drinking.  He continues to feel worse and today he could barely complete his first job and could not even go to a second job so finally went to the doctor.  When he arrived at the office he was noted to be hypotensive with pressures in the 80s.  He does take 2 blood pressure medications and has been compliant with those.  He does report a recent change in his biologic injection due to insurance covering the 1 he was on and that switched about 2 weeks ago but reports he was having the symptoms prior to that.  He has had no abdominal pain, chest pain, nausea or vomiting.  He denies any cough or congestion.  His wife felt that on Sunday he seemed out of breath anytime he tried to do anything but he has not had that today.  He has not had any swelling in his legs.  The history is provided by the patient and the spouse.  Illness      Home  Medications Prior to Admission medications   Medication Sig Start Date End Date Taking? Authorizing Provider  ciprofloxacin (CIPRO) 500 MG tablet Take 1 tablet (500 mg total) by mouth every 12 (twelve) hours for 28 days. 03/26/23 04/23/23 Yes Espiridion Supinski, Alphonzo Lemmings, MD  atorvastatin (LIPITOR) 10 MG tablet TAKE 1 TABLET BY MOUTH EVERY DAY FOR CHOLESTEROL 02/28/23   Doreene Nest, NP  cyclobenzaprine (FLEXERIL) 5 MG tablet Take 1 tablet (5 mg total) by mouth 3 (three) times daily as needed. 12/06/22   Doreene Nest, NP  diclofenac (VOLTAREN) 75 MG EC tablet TAKE 1 TABLET BY MOUTH TWICE A DAY 02/08/23   Lenn Sink, DPM  DULoxetine (CYMBALTA) 30 MG capsule Take 1 capsule (30 mg total) by mouth daily. For chronic pain. 12/06/22   Doreene Nest, NP  DULoxetine (CYMBALTA) 60 MG capsule Take 60 mg by mouth daily. 04/02/19   [provider]  folic acid (FOLVITE) 1 MG tablet Take 1 mg by mouth daily.    [provider]  gabapentin (NEURONTIN) 600 MG tablet Take 1 tablet (600 mg total) by mouth 2 (two) times daily. For pain. 07/26/22   Doreene Nest, NP  hydrochlorothiazide (HYDRODIURIL) 12.5 MG tablet TAKE 1 TABLET (12.5 MG TOTAL) BY MOUTH DAILY FOR BLOOD PRESSURE 05/19/22   Chestine Spore,  Keane Scrape, NP  HYDROcodone-acetaminophen (NORCO/VICODIN) 5-325 MG tablet Take 1-2 tablets by mouth every 6 (six) hours as needed for moderate pain (pain score 4-6). 12/25/22   Doreene Nest, NP  HYRIMOZ 40 MG/0.4ML SOAJ Inject into the skin every 14 (fourteen) days. 07/10/22   [provider]  metFORMIN (GLUCOPHAGE) 1000 MG tablet TAKE 1 TABLET (1,000 MG TOTAL) BY MOUTH 2 (TWO) TIMES DAILY WITH A MEAL. FOR DIABETES. 02/12/23   Doreene Nest, NP  methotrexate 50 MG/2ML injection Inject 0.8 mLs (20 mg total) as directed once a week on the same day each week. 12/26/21     olmesartan (BENICAR) 20 MG tablet TAKE 1 TABLET (20 MG TOTAL) BY MOUTH DAILY. FOR BLOOD PRESSURE. 08/11/22   Doreene Nest, NP  Semaglutide, 2 MG/DOSE, (OZEMPIC, 2 MG/DOSE,) 8 MG/3ML SOPN INJECT 2 MG AS DIRECTED ONCE A WEEK. FOR DIABETES. 12/25/22   Doreene Nest, NP  tamsulosin (FLOMAX) 0.4 MG CAPS capsule TAKE 2 CAPSULES (0.8 MG TOTAL) BY MOUTH DAILY. FOR URINARY FREQUNCY 02/28/23   Doreene Nest, NP  traMADol Janean Sark) 50 MG tablet Take by mouth. 12/20/22   [provider]      Allergies    Lisinopril    Review of Systems   Review of Systems  Physical Exam Updated Vital Signs BP (!) 124/51   Pulse 71   Temp 98.4 F (36.9 C) (Oral)   Resp 18   SpO2 95%  Physical Exam Vitals and nursing note reviewed.  Constitutional:      General: He is not in acute distress.    Appearance: He is well-developed.  HENT:     Head: Normocephalic and atraumatic.  Eyes:     Conjunctiva/sclera: Conjunctivae normal.     Pupils: Pupils are equal, round, and reactive to light.  Cardiovascular:     Rate and Rhythm: Normal rate and regular rhythm.     Pulses: Normal pulses.     Heart sounds: No murmur heard. Pulmonary:     Effort: Pulmonary effort is normal. No respiratory distress.     Breath sounds: Normal breath sounds. No wheezing or rales.  Abdominal:     General: There is no distension.     Palpations: Abdomen is soft.     Tenderness: There is no abdominal tenderness. There is no right CVA tenderness, left CVA tenderness, guarding or rebound.  Musculoskeletal:        General: No tenderness. Normal range of motion.     Cervical back: Normal range of motion and neck supple.     Right lower leg: No edema.     Left lower leg: No edema.  Skin:    General: Skin is warm and dry.     Findings: No erythema or rash.  Neurological:     Mental Status: He is alert and oriented to person, place, and time.  Psychiatric:        Behavior: Behavior normal.     ED Results / Procedures / Treatments   Labs (all labs ordered are listed, but only abnormal results are displayed) Labs Reviewed   BASIC METABOLIC PANEL - Abnormal; Notable for the following components:      Result Value   Sodium 133 (*)    Chloride 96 (*)    Glucose, Bld 108 (*)    BUN 36 (*)    All other components within normal limits  CBC - Abnormal; Notable for the following components:   WBC 21.7 (*)  RBC 4.17 (*)    HCT 38.5 (*)    All other components within normal limits  URINALYSIS, ROUTINE W REFLEX MICROSCOPIC - Abnormal; Notable for the following components:   Hgb urine dipstick SMALL (*)    Protein, ur 30 (*)    All other components within normal limits  HEPATIC FUNCTION PANEL - Abnormal; Notable for the following components:   Alkaline Phosphatase 25 (*)    All other components within normal limits  RESP PANEL BY RT-PCR (RSV, FLU A&B, COVID)  [id number]  URINE CULTURE  LACTIC ACID, PLASMA  CBG MONITORING, ED  TROPONIN I (HIGH SENSITIVITY)    EKG None  Radiology CT ABDOMEN PELVIS W CONTRAST Result Date: 03/26/2023 CLINICAL DATA:  Sepsis. EXAM: CT ABDOMEN AND PELVIS WITH CONTRAST TECHNIQUE: Multidetector CT imaging of the abdomen and pelvis was performed using the standard protocol following bolus administration of intravenous contrast. RADIATION DOSE REDUCTION: This exam was performed according to the departmental dose-optimization program which includes automated exposure control, adjustment of the mA and/or kV according to patient size and/or use of iterative reconstruction technique. CONTRAST:  OMNIPAQUE IOHEXOL 300 MG/ML  SOLN COMPARISON:  CT abdomen pelvis dated 07/12/2006. FINDINGS: Lower chest: The visualized lung bases are clear. No intra-abdominal free air or free fluid. Hepatobiliary: Fatty liver. No biliary dilatation. The gallbladder is unremarkable. Pancreas: Unremarkable. No pancreatic ductal dilatation or surrounding inflammatory changes. Spleen: Normal in size without focal abnormality. Adrenals/Urinary Tract: The adrenal glands are unremarkable. There is no hydronephrosis on  either side. There is symmetric enhancement and excretion of contrast by both kidneys. The visualized ureters appear unremarkable. The urinary bladder is minimally distended. There is perivesical stranding. Correlation with urinalysis recommended to evaluate for cystitis. Stomach/Bowel: There is sigmoid diverticulosis. Moderate stool throughout the colon. There is no bowel obstruction or active inflammation. The appendix is normal. Vascular/Lymphatic: Mild aortoiliac atherosclerotic disease to the IVC is unremarkable. No portal venous gas. There is no adenopathy. Reproductive: Stranding adjacent to the prostate and seminal vesicles may be related to cystitis or prostatitis. Other: Small fat containing bilateral inguinal and umbilical hernias. Musculoskeletal: Degenerative changes of the spine. No acute osseous pathology. IMPRESSION: 1. Perivesical and periprostatic stranding. Correlation with urinalysis recommended to evaluate for cystitis or prostatitis. 2. Sigmoid diverticulosis. No bowel obstruction. Normal appendix. 3. Fatty liver. 4.  Aortic Atherosclerosis (ICD10-I70.0). Electronically Signed   By: Elgie Collard M.D.   On: 03/26/2023 21:02   DG Chest 2 View Result Date: 03/26/2023 CLINICAL DATA:  Generalized weakness and hypotension. EXAM: CHEST - 2 VIEW COMPARISON:  February 20, 2020 FINDINGS: The heart size and mediastinal contours are within normal limits. Both lungs are clear. Multilevel degenerative changes are seen within the mid and lower thoracic spine. IMPRESSION: No active cardiopulmonary disease. Electronically Signed   By: Aram Candela M.D.   On: 03/26/2023 19:48    Procedures Procedures    Medications Ordered in ED Medications  lactated ringers bolus 1,000 mL (0 mLs Intravenous Stopped 03/26/23 2117)  iohexol (OMNIPAQUE) 300 MG/ML solution 100 mL (100 mLs Intravenous Contrast Given 03/26/23 1956)  cefTRIAXone (ROCEPHIN) 1 g in sodium chloride 0.9 % 100 mL IVPB (1 g Intravenous  New Bag/Given 03/26/23 2239)  lactated ringers bolus 1,000 mL (1,000 mLs Intravenous New Bag/Given 03/26/23 2239)    ED Course/ Medical Decision Making/ A&P  Medical Decision Making Amount and/or Complexity of Data Reviewed Independent Historian: spouse External Data Reviewed: notes. Labs: ordered. Decision-making details documented in ED Course. Radiology: ordered and independent interpretation performed. Decision-making details documented in ED Course. ECG/medicine tests: ordered and independent interpretation performed. Decision-making details documented in ED Course.  Risk Prescription drug management.   Pt with multiple medical problems and comorbidities and presenting today with a complaint that caries a high risk for morbidity and mortality.  Here today with multiple vague symptoms.  He has no localized abdominal pain or chest pain but has been noted to be hypotensive.  Symptoms have been progressive over 3 weeks.  He did have some urinary complaints as well.  Concern for pyelonephritis versus acute abdominal process versus aortic abnormality versus acute infectious back etiology.  He denies any respiratory symptoms and low suspicion for pneumonia or acute cardiac disease. I independently interpreted patient's labs and EKG.  CBC today with a leukocytosis of 21,000, BMP with mild AKI today with creatinine of 1 from his baseline of 0.8 with normal sugar and potassium, UA today with 11-20 red cells and 6-10 white cells but no evidence of bacteria and viral panel is negative. Trop is neg. EKG I have independently visualized and interpreted pt's images today.  CXR wnl.  11:20 PM CT of the abd pelvis without hydronephrosis or gallbladder pathology.  LFT's wnl.  Radiology reports perivesical and Periprostatic stranding concerning for cystitis or prostatitis.  Patient otherwise has no other findings.  Given patient's symptoms we will treat for prostatitis with  cipro but urine culture also done.  At this time patient does not meet criteria for admission.  Findings were discussed with he and his wife and he is comfortable going home but was given strict return precautions.          Final Clinical Impression(s) / ED Diagnoses Final diagnoses:  Prostatitis, acute    Rx / DC Orders ED Discharge Orders          Ordered    ciprofloxacin (CIPRO) 500 MG tablet  Every 12 hours        03/26/23 2319              Gwyneth Sprout, MD 03/26/23 2320

## 2023-03-26 NOTE — Patient Instructions (Addendum)
 As discussed, please go to the ER.   Schedule follow up with Seth Larsen in 2-3 days.   It was nice to meet you.

## 2023-03-26 NOTE — Assessment & Plan Note (Signed)
 EKG tracing is personally reviewed.  EKG notes sinus rhythm with non specific QRS widening.   No chest pain.  Given presentation, length of symptoms, discussed with patient and his wife, patient will go to the ER. Marland Kitchen

## 2023-03-26 NOTE — Assessment & Plan Note (Signed)
 All of the BP readings in office consisently low and worsening.   Suspect this related to dehydration.  Urine spec grav - >=1.030; urine color amber.   Initially was going to do STAT labs; however suspect patient requires immediate intervention given presentation and symptoms.   Discussed with patient and his wife, both verbalize understanding and agreeable with plan.

## 2023-03-26 NOTE — Progress Notes (Signed)
 Established Patient Office Visit  Subjective   Patient ID: Seth Larsen, male    DOB: Oct 01, 1960  Age: 63 y.o. MRN: [id number]  Chief Complaint  Patient presents with   Fatigue    Weak, dizzy, tremors in hands, very thirsty, gets winded at times, chills and sweating, having urinary issues; gets the urge and goes before he can get to the bathroom. Sx started couple weeks ago and getting worse; urinary sx started about 3 maybe 4 days ago and is on medication for his urination issues.      HPI  Seth Larsen is a 63 year old male, patient of Vernona Rieger, NP, with past medical history of HTN, OSA, type 2 diabetes, HLD, presents today for an acute visit to discuss fatigue.   His wife is also present today.  Symptom onset two weeks ago and getting worse. Initially symptoms started with weakness, dizziness, getting short-winded and lethargy. He did slightly get better and has been able to work every day. On Thursday, he had chills with shaking, his wife checked his temperature was 98.0 F. Then 3-4 days ago, he started developing urinary symptoms. He has urine urgency with no painful urination or burning with urination. On Friday, he had the urge every hour. He did have some accidents and had to buy depends. Feels thirsty all the time. Has a BP cuff at home but hasn't been check BP. Currently managed on Olmesartan 20 mg once daily and hydrochlorothiazide 12.5 mg once daily. He has been taking his medications as prescribed, last dose this morning. He is currently on Flomax 0.4 mg for urinary frequency. His wife does report that he does not drink enough water. Works two jobs from 3 am-7 am and then 8-5 p.m.    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   Lower extremity edema 04/06/2020   Chronic foot pain 04/06/2020   Nocturia 05/14/2019   Rheumatoid arthritis (HCC) 08/06/2018   Hyperlipidemia 07/23/2015   Type 2 diabetes mellitus with hyperglycemia (HCC) 07/23/2015   Preventative health care 07/23/2015   OSA (obstructive sleep apnea) 07/06/2015   Essential hypertension 07/06/2015   Chronic back pain 07/06/2015   Past Medical History:  Diagnosis Date   Arthritis    COVID-19 virus infection 08/27/2020   Hypertension    Type 2 diabetes mellitus (HCC)    URI (upper respiratory infection) 03/04/2020   Allergies  Allergen Reactions   Lisinopril Other (See Comments)    Myalgias, urinary frequency         05/19/2022    8:01 AM 05/18/2021    8:07 AM 05/13/2020    8:40 AM  Depression screen PHQ 2/9  Decreased Interest 1 0 0  Down, Depressed, Hopeless 0 0 0  PHQ - 2 Score 1 0 0  Altered sleeping 1 0 3  Tired, decreased energy 2 1 0  Change in appetite 1 0 0  Feeling bad or failure about yourself  0 0 0  Trouble concentrating 0 0 0  Moving slowly or fidgety/restless 1 0 0  Suicidal thoughts 0 0 0  PHQ-9 Score 6 1 3   Difficult doing work/chores Not difficult at all  Not  difficult at all       05/19/2022    8:02 AM  GAD 7 : Generalized Anxiety Score  Nervous, Anxious, on Edge 0  Control/stop worrying 0  Worry too much - different things 1  Trouble relaxing 0  Restless 0  Easily annoyed or irritable 0  Afraid - awful might happen 0  Total GAD 7 Score 1  Anxiety Difficulty Not difficult at all      Review of Systems  Constitutional:  Positive for chills and malaise/fatigue. Negative for fever.  Respiratory:  Positive for shortness of breath.        Shortness of breath with activity.   Cardiovascular:  Negative for chest pain.  Gastrointestinal:  Negative for nausea and vomiting.  Genitourinary:  Positive for flank pain, frequency and urgency. Negative for dysuria  and hematuria.  Neurological:  Positive for dizziness and weakness. Negative for headaches.      Objective:     BP (!) 80/40   Pulse 85   Temp 97.7 F (36.5 C) (Oral)   Wt 263 lb (119.3 kg)   SpO2 95%   BMI 41.19 kg/m  BP Readings from Last 3 Encounters:  03/26/23 (!) 80/40  12/06/22 (!) 142/78  11/22/22 126/64   Wt Readings from Last 3 Encounters:  03/26/23 263 lb (119.3 kg)  12/06/22 278 lb (126.1 kg)  11/22/22 275 lb (124.7 kg)      Physical Exam Vitals and nursing note reviewed.  Constitutional:      Appearance: Normal appearance.  Cardiovascular:     Rate and Rhythm: Normal rate and regular rhythm.     Pulses: Normal pulses.     Heart sounds: Normal heart sounds.  Pulmonary:     Effort: Pulmonary effort is normal.     Breath sounds: Normal breath sounds.  Abdominal:     General: Bowel sounds are normal.     Tenderness: There is no abdominal tenderness. There is no right CVA tenderness, left CVA tenderness, guarding or rebound.  Skin:    General: Skin is warm.  Neurological:     General: No focal deficit present.     Mental Status: He is alert and oriented to person, place, and time.     Gait: Gait normal.  Psychiatric:        Mood and Affect: Mood normal.        Behavior: Behavior normal.        Thought Content: Thought content normal.        Judgment: Judgment normal.      Results for orders placed or performed in visit on 03/26/23  POCT Urinalysis Dipstick (Automated)  Result Value Ref Range   Color, UA amber    Clarity, UA clear    Glucose, UA Negative Negative   Bilirubin, UA 1 mg/dL    Ketones, UA positive    Spec Grav, UA >=1.030 (A) 1.010 - 1.025   Blood, UA 10 ery/uL    pH, UA 5.0 5.0 - 8.0   Protein, UA Positive (A) Negative   Urobilinogen, UA 0.2 0.2 or 1.0 E.U./dL   Nitrite, UA neg    Leukocytes, UA Trace (A) Negative       The ASCVD Risk score (Arnett DK, et al., 2019) failed to calculate for the following reasons:   The  valid systolic blood pressure range is 90 to 200 mmHg    Assessment & Plan:  Dyspnea on exertion Assessment & Plan: EKG tracing is personally reviewed.  EKG  notes sinus rhythm with non specific QRS widening.   No chest pain.  Given presentation, length of symptoms, discussed with patient and his wife, patient will go to the ER. .   Orders: -     EKG 12-Lead  Urinary urgency Assessment & Plan: POC UA show trace of leukocytes, positive for protein and ketones.  Urine culture pending.   Patient sent to the ER for IV fluids. Suspect dehydration and/or AKI.  Orders: -     POCT Urinalysis Dipstick (Automated) -     Urine Culture  Polydipsia Assessment & Plan: Last A1C in October was 5.9.  Currently on metformin and ozempic.    Hypotension due to hypovolemia Assessment & Plan: All of the BP readings in office consisently low and worsening.   Suspect this related to dehydration.  Urine spec grav - >=1.030; urine color amber.   Initially was going to do STAT labs; however suspect patient requires immediate intervention given presentation and symptoms.   Discussed with patient and his wife, both verbalize understanding and agreeable with plan.     Return in about 3 days (around 03/29/2023) for follow up on symptoms with PCP.Modesto Charon, NP

## 2023-03-26 NOTE — Discharge Instructions (Addendum)
 Because the CAT scan showed possibility that your prostate was infected we will give you a longer course of antibiotics that will be for 4 weeks however this will also treat a urinary tract infection.  Make sure you are drinking plenty of fluids and resting over the next few days.  Avoid taking your blood pressure medication if your blood pressure is low when you check it in the morning.  If you start having fever, confusion, vomiting, new pain or other concerns including passing out or you develop chest pain or shortness of breath return to the emergency room.

## 2023-03-26 NOTE — Assessment & Plan Note (Addendum)
 POC UA show trace of leukocytes, positive for protein and ketones.  Urine culture pending.   Patient sent to the ER for IV fluids. Suspect dehydration and/or AKI.

## 2023-03-26 NOTE — ED Triage Notes (Addendum)
 Pt has been feeling unwell for 2 weeks, he states that he has been feeling tired, lack of energy and generally unwell.  Pt was seen at his PCP and tells me that they told him he was dehydrated.  No labs were done.  Pt was hypotensive at PCP office.  Pt reports generalized weakness and fatigue

## 2023-03-26 NOTE — Assessment & Plan Note (Signed)
 Last A1C in October was 5.9.  Currently on metformin and ozempic.

## 2023-03-26 NOTE — ED Notes (Signed)
 Pt states he has not been feeling well x 2 weeks, mainly weakness and fatigue Denies cough or CP Wife at bedside

## 2023-03-26 NOTE — ED Notes (Signed)
 Patient transported to CT

## 2023-03-27 ENCOUNTER — Encounter: Payer: Self-pay | Admitting: General Practice

## 2023-03-27 LAB — URINE CULTURE
MICRO NUMBER:: 16092684
SPECIMEN QUALITY:: ADEQUATE

## 2023-03-29 LAB — URINE CULTURE: Culture: 10000 — AB

## 2023-03-30 ENCOUNTER — Ambulatory Visit: Payer: PRIVATE HEALTH INSURANCE | Admitting: Primary Care

## 2023-03-30 ENCOUNTER — Encounter: Payer: Self-pay | Admitting: Primary Care

## 2023-03-30 VITALS — BP 148/66 | HR 95 | Temp 97.9°F | Ht 67.0 in | Wt 260.0 lb

## 2023-03-30 DIAGNOSIS — N410 Acute prostatitis: Secondary | ICD-10-CM

## 2023-03-30 DIAGNOSIS — I10 Essential (primary) hypertension: Secondary | ICD-10-CM | POA: Diagnosis not present

## 2023-03-30 HISTORY — DX: Acute prostatitis: N41.0

## 2023-03-30 NOTE — Assessment & Plan Note (Signed)
 With recent ED visit. ED notes, labs, imaging reviewed.  Improving. Vitals improved.  Continue ciprofloxacin 500 mg BID until complete.

## 2023-03-30 NOTE — Progress Notes (Signed)
 Subjective:    Patient ID: Seth Larsen, male    DOB: [birth year], 63 y.o.   MRN: [id number]  HPI  Seth Larsen is a very pleasant 63 y.o. male with a history of hypertension, OSA, type 2 diabetes, rheumatoid arthritis, chronic back pain, lower extremity edema, exertional dyspnea who presents today for ED follow-up.  Originally evaluated by Norma Fredrickson, NP on 03/26/2023 for a 2-week history of weakness, dizziness, exertional dyspnea with lethargy.  He was noted to be hypotensive in the office with several blood pressure checks.  He was referred to the emergency department for further evaluation.  He presented to [facility] in [place] later that day for evaluation of hypotension and other symptoms.  He mentioned respiratory illness 2 weeks prior.  During his stay in the ED he was treated with IV fluids, Rocephin 1 g IVP back for presumed cystitis.  He underwent CT abdomen/pelvis which was negative for acute process.  Lab work and chest x-ray were without acute process.  Symptoms were suspected to be secondary to prostatitis so he was treated with ciprofloxacin 500 mg twice daily for 28 days.  He was discharged home.  Since his ED visit he is compliant to ciprofloxacin 500 mg twice daily. He discusses symptoms of urinary urgency with urine dribbling, nocturia, urinary incontinence, fatigue for 1-2 weeks prior to his visit in our office and to the ED. He's feeling better now. Energy has improved, is able to urinate better, no urinary incontinence, less nocturia. His dizziness has resolved. He's drinking plenty of water. He has held his olmesartan 20 mg since last visit.    BP Readings from Last 3 Encounters:  03/30/23 (!) 148/66  03/26/23 (!) 136/45  03/26/23 (!) 80/40   Wt Readings from Last 3 Encounters:  03/30/23 260 lb (117.9 kg)  03/26/23 263 lb (119.3 kg)  12/06/22 278 lb (126.1 kg)      Review of Systems  Constitutional:  Negative for chills and fever.  Genitourinary:   Negative for dysuria, frequency, hematuria and urgency.         Past Medical History:  Diagnosis Date   Arthritis    COVID-19 virus infection 08/27/2020   Hypertension    Type 2 diabetes mellitus (HCC)    URI (upper respiratory infection) 03/04/2020    Social History   Socioeconomic History   Marital status: Married    Spouse name: Not on file   Number of children: Not on file   Years of education: Not on file   Highest education level: Not on file  Occupational History   Not on file  Tobacco Use   Smoking status: Never   Smokeless tobacco: Current    Types: Chew  Vaping Use   Vaping status: Never Used  Substance and Sexual Activity   Alcohol use: No    Alcohol/week: 0.0 standard drinks of alcohol   Drug use: No   Sexual activity: Not on file  Other Topics Concern   Not on file  Social History Narrative   Married.   Works at numerous occupations.    Enjoys tending to his animals.    Social Drivers of Corporate investment banker Strain: Not on file  Food Insecurity: Not on file  Transportation Needs: Not on file  Physical Activity: Not on file  Stress: Not on file  Social Connections: Not on file  Intimate Partner Violence: Not on file    Past Surgical History:  Procedure Laterality Date   BACK  SURGERY     CARPAL TUNNEL RELEASE     KNEE SURGERY     TOTAL KNEE ARTHROPLASTY      Family History  Problem Relation Age of Onset   Hypertension Mother    Breast cancer Mother    Hypertension Father    Alzheimer's disease Father    Breast cancer Sister    Diabetes Brother    Colon cancer Neg Hx    Colon polyps Neg Hx    Esophageal cancer Neg Hx    Rectal cancer Neg Hx    Stomach cancer Neg Hx     Allergies  Allergen Reactions   Lisinopril Other (See Comments)    Myalgias, urinary frequency    Current Outpatient Medications on File Prior to Visit  Medication Sig Dispense Refill   atorvastatin (LIPITOR) 10 MG tablet TAKE 1 TABLET BY MOUTH EVERY  DAY FOR CHOLESTEROL 90 tablet 0   ciprofloxacin (CIPRO) 500 MG tablet Take 1 tablet (500 mg total) by mouth every 12 (twelve) hours for 28 days. 56 tablet 0   cyclobenzaprine (FLEXERIL) 5 MG tablet Take 1 tablet (5 mg total) by mouth 3 (three) times daily as needed. 30 tablet 0   diclofenac (VOLTAREN) 75 MG EC tablet TAKE 1 TABLET BY MOUTH TWICE A DAY 50 tablet 2   DULoxetine (CYMBALTA) 30 MG capsule Take 1 capsule (30 mg total) by mouth daily. For chronic pain. 14 capsule 0   DULoxetine (CYMBALTA) 60 MG capsule Take 60 mg by mouth daily.     folic acid (FOLVITE) 1 MG tablet Take 1 mg by mouth daily.     gabapentin (NEURONTIN) 600 MG tablet Take 1 tablet (600 mg total) by mouth 2 (two) times daily. For pain. 180 tablet 2   hydrochlorothiazide (HYDRODIURIL) 12.5 MG tablet TAKE 1 TABLET (12.5 MG TOTAL) BY MOUTH DAILY FOR BLOOD PRESSURE 90 tablet 3   HYDROcodone-acetaminophen (NORCO/VICODIN) 5-325 MG tablet Take 1-2 tablets by mouth every 6 (six) hours as needed for moderate pain (pain score 4-6). 30 tablet 0   metFORMIN (GLUCOPHAGE) 1000 MG tablet TAKE 1 TABLET (1,000 MG TOTAL) BY MOUTH 2 (TWO) TIMES DAILY WITH A MEAL. FOR DIABETES. 180 tablet 1   methotrexate 50 MG/2ML injection Inject 0.8 mLs (20 mg total) as directed once a week on the same day each week. 2 mL 0   olmesartan (BENICAR) 20 MG tablet TAKE 1 TABLET (20 MG TOTAL) BY MOUTH DAILY. FOR BLOOD PRESSURE. 90 tablet 2   Semaglutide, 2 MG/DOSE, (OZEMPIC, 2 MG/DOSE,) 8 MG/3ML SOPN INJECT 2 MG AS DIRECTED ONCE A WEEK. FOR DIABETES. 9 mL 1   tamsulosin (FLOMAX) 0.4 MG CAPS capsule TAKE 2 CAPSULES (0.8 MG TOTAL) BY MOUTH DAILY. FOR URINARY FREQUNCY 180 capsule 0   traMADol (ULTRAM) 50 MG tablet Take by mouth.     HYRIMOZ 40 MG/0.4ML SOAJ Inject into the skin every 14 (fourteen) days. (Patient not taking: Reported on 03/30/2023)     No current facility-administered medications on file prior to visit.    BP (!) 148/66   Pulse 95   Temp 97.9 F  (36.6 C) (Temporal)   Ht 5\' 7"  (1.702 m)   Wt 260 lb (117.9 kg)   SpO2 98%   BMI 40.72 kg/m  Objective:   Physical Exam Cardiovascular:     Rate and Rhythm: Normal rate and regular rhythm.  Pulmonary:     Effort: Pulmonary effort is normal.     Breath sounds: Normal breath sounds.  Musculoskeletal:     Cervical back: Neck supple.  Skin:    General: Skin is warm and dry.  Neurological:     Mental Status: He is alert and oriented to person, place, and time.  Psychiatric:        Mood and Affect: Mood normal.           Assessment & Plan:  Acute bacterial prostatitis Assessment & Plan: With recent ED visit. ED notes, labs, imaging reviewed.  Improving. Vitals improved.  Continue ciprofloxacin 500 mg BID until complete.     Essential hypertension Assessment & Plan: Resume olmesartan 20 mg daily. Continue hydrochlorothiazide 12.5 mg daily.         Doreene Nest, NP

## 2023-03-30 NOTE — Assessment & Plan Note (Signed)
 Resume olmesartan 20 mg daily. Continue hydrochlorothiazide 12.5 mg daily.

## 2023-04-09 ENCOUNTER — Encounter: Payer: Self-pay | Admitting: Podiatry

## 2023-04-09 ENCOUNTER — Ambulatory Visit (INDEPENDENT_AMBULATORY_CARE_PROVIDER_SITE_OTHER): Payer: PRIVATE HEALTH INSURANCE

## 2023-04-09 ENCOUNTER — Ambulatory Visit (INDEPENDENT_AMBULATORY_CARE_PROVIDER_SITE_OTHER): Payer: PRIVATE HEALTH INSURANCE | Admitting: Podiatry

## 2023-04-09 DIAGNOSIS — M76821 Posterior tibial tendinitis, right leg: Secondary | ICD-10-CM | POA: Diagnosis not present

## 2023-04-09 DIAGNOSIS — M7751 Other enthesopathy of right foot: Secondary | ICD-10-CM

## 2023-04-09 MED ORDER — TRIAMCINOLONE ACETONIDE 10 MG/ML IJ SUSP
10.0000 mg | Freq: Once | INTRAMUSCULAR | Status: AC
  Administered 2023-04-09: 10 mg via INTRA_ARTICULAR

## 2023-04-11 NOTE — Progress Notes (Signed)
 Subjective:   Patient ID: Seth Larsen, male   DOB: 63 y.o.   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

## 2023-04-18 ENCOUNTER — Ambulatory Visit: Payer: Self-pay | Admitting: Primary Care

## 2023-04-18 NOTE — Telephone Encounter (Signed)
  Chief Complaint: dizziness Symptoms: dizziness, nausea, visual changes Frequency: began at 0500 this morning Pertinent Negatives: Patient denies CP. SOB, weakness, numbness Disposition: [] ED /[] Urgent Care (no appt availability in office) / [x] Appointment(In office/virtual)/ []  [facility]/ [] Home Care/ [x] Refused Recommended Disposition /[] [facility]/ []  Follow-up with PCP Additional Notes: Patient calls reporting dizziness that began around 0500 this morning. States he was seeing "white spots", but once rested symptoms resolved. Patient states this has happened in the past as well, but he has not been evaluated by a provider. Patient reports in the past he has also had low BP readings, not regularly monitoring at home at this time. States he feels some of his medications may need adjustment. Per protocol, patient to be evaluated within 24 hours. First available appointment with PCP or any provider in clinic outside of time frame, patient declines to be seen in alt office. Patient requests this RN notify PCP and requests call back. Care advice reviewed, patient verbalized understanding. Alerting PCP for review.    Copied from CRM [id number]. Topic: Clinical - Red Word Triage >> Apr 18, 2023  2:41 PM Sim Boast F wrote: Red Word that prompted transfer to Nurse Triage: Patient experienced dizziness, weakness,white spots this morning around 5am, patient wants to know what he should regarding his medications Reason for Disposition  [1] MODERATE dizziness (e.g., interferes with normal activities) AND [2] has NOT been evaluated by doctor (or NP/PA) for this  (Exception: Dizziness caused by heat exposure, sudden standing, or poor fluid intake.)  Answer Assessment - Initial Assessment Questions 1. DESCRIPTION: "Describe your dizziness."     Like I was drunk or something 2. LIGHTHEADED: "Do you feel lightheaded?" (e.g., somewhat faint, woozy, weak upon standing)     Woozy and weak- it  is resolved at this time. 3. VERTIGO: "Do you feel like either you or the room is spinning or tilting?" (i.e. vertigo)     Denies 4. SEVERITY: "How bad is it?"  "Do you feel like you are going to faint?" "Can you stand and walk?"   - MILD: Feels slightly dizzy, but walking normally.   - MODERATE: Feels unsteady when walking, but not falling; interferes with normal activities (e.g., school, work).   - SEVERE: Unable to walk without falling, or requires assistance to walk without falling; feels like passing out now.      Moderate, states he felt he was staggering 5. ONSET:  "When did the dizziness begin?"     0500 this morning was mild, worsening around 0630, mostly gone at this time. 6. AGGRAVATING FACTORS: "Does anything make it worse?" (e.g., standing, change in head position)     Bending over 7. HEART RATE: "Can you tell me your heart rate?" "How many beats in 15 seconds?"  (Note: not all patients can do this)       Unable to monitor 8. CAUSE: "What do you think is causing the dizziness?"     Recently having an infection and wondering if it is the same. 9. RECURRENT SYMPTOM: "Have you had dizziness before?" If Yes, ask: "When was the last time?" "What happened that time?"     End of last year, brief episode, was not evaluated. 10. OTHER SYMPTOMS: "Do you have any other symptoms?" (e.g., fever, chest pain, vomiting, diarrhea, bleeding)       Seeing white spots, nausea  Protocols used: Dizziness - Lightheadedness-A-AH

## 2023-04-18 NOTE — Telephone Encounter (Signed)
 Called and scheduled patient appt for 04/19/23 with Mort Sawyers

## 2023-04-18 NOTE — Telephone Encounter (Signed)
 Since patient is not checking BP he needs to be seen in office or UC. His PCP is out of the office until next week

## 2023-04-19 ENCOUNTER — Encounter: Payer: Self-pay | Admitting: Family

## 2023-04-19 ENCOUNTER — Ambulatory Visit (INDEPENDENT_AMBULATORY_CARE_PROVIDER_SITE_OTHER): Payer: PRIVATE HEALTH INSURANCE | Admitting: Family

## 2023-04-19 VITALS — BP 118/62 | HR 78 | Temp 98.3°F | Ht 67.0 in | Wt 258.0 lb

## 2023-04-19 DIAGNOSIS — I10 Essential (primary) hypertension: Secondary | ICD-10-CM

## 2023-04-19 DIAGNOSIS — R42 Dizziness and giddiness: Secondary | ICD-10-CM

## 2023-04-19 DIAGNOSIS — I952 Hypotension due to drugs: Secondary | ICD-10-CM | POA: Insufficient documentation

## 2023-04-19 DIAGNOSIS — R29898 Other symptoms and signs involving the musculoskeletal system: Secondary | ICD-10-CM

## 2023-04-19 DIAGNOSIS — H93A3 Pulsatile tinnitus, bilateral: Secondary | ICD-10-CM

## 2023-04-19 DIAGNOSIS — N410 Acute prostatitis: Secondary | ICD-10-CM

## 2023-04-19 DIAGNOSIS — H539 Unspecified visual disturbance: Secondary | ICD-10-CM | POA: Diagnosis not present

## 2023-04-19 DIAGNOSIS — E1165 Type 2 diabetes mellitus with hyperglycemia: Secondary | ICD-10-CM | POA: Diagnosis not present

## 2023-04-19 DIAGNOSIS — H9390 Unspecified disorder of ear, unspecified ear: Secondary | ICD-10-CM

## 2023-04-19 DIAGNOSIS — Z7984 Long term (current) use of oral hypoglycemic drugs: Secondary | ICD-10-CM

## 2023-04-19 HISTORY — DX: Unspecified disorder of ear, unspecified ear: H93.90

## 2023-04-19 HISTORY — DX: Dizziness and giddiness: R42

## 2023-04-19 LAB — CBC WITH DIFFERENTIAL/PLATELET
Basophils Absolute: 0 10*3/uL (ref 0.0–0.1)
Basophils Relative: 0.5 % (ref 0.0–3.0)
Eosinophils Absolute: 0.2 10*3/uL (ref 0.0–0.7)
Eosinophils Relative: 1.8 % (ref 0.0–5.0)
HCT: 41.3 % (ref 39.0–52.0)
Hemoglobin: 13.6 g/dL (ref 13.0–17.0)
Lymphocytes Relative: 23.2 % (ref 12.0–46.0)
Lymphs Abs: 2.4 10*3/uL (ref 0.7–4.0)
MCHC: 33 g/dL (ref 30.0–36.0)
MCV: 95.1 fl (ref 78.0–100.0)
Monocytes Absolute: 1.3 10*3/uL — ABNORMAL HIGH (ref 0.1–1.0)
Monocytes Relative: 13.1 % — ABNORMAL HIGH (ref 3.0–12.0)
Neutro Abs: 6.3 10*3/uL (ref 1.4–7.7)
Neutrophils Relative %: 61.4 % (ref 43.0–77.0)
Platelets: 204 10*3/uL (ref 150.0–400.0)
RBC: 4.34 Mil/uL (ref 4.22–5.81)
RDW: 14.6 % (ref 11.5–15.5)
WBC: 10.2 10*3/uL (ref 4.0–10.5)

## 2023-04-19 LAB — BASIC METABOLIC PANEL
BUN: 35 mg/dL — ABNORMAL HIGH (ref 6–23)
CO2: 30 meq/L (ref 19–32)
Calcium: 9.7 mg/dL (ref 8.4–10.5)
Chloride: 100 meq/L (ref 96–112)
Creatinine, Ser: 0.96 mg/dL (ref 0.40–1.50)
GFR: 83.51 mL/min (ref >60.00)
Glucose, Bld: 93 mg/dL (ref 70–99)
Potassium: 4.5 meq/L (ref 3.5–5.1)
Sodium: 138 meq/L (ref 135–145)

## 2023-04-19 LAB — TSH: TSH: 0.79 u[IU]/mL (ref 0.35–5.50)

## 2023-04-19 LAB — MICROALBUMIN / CREATININE URINE RATIO
Creatinine,U: 0 mg/dL
Microalb Creat Ratio: UNDETERMINED mg/g (ref 0.0–30.0)
Microalb, Ur: <0.7 mg/dL

## 2023-04-19 LAB — VITAMIN B12: Vitamin B-12: 163 pg/mL — ABNORMAL LOW (ref 211–911)

## 2023-04-19 LAB — HEMOGLOBIN A1C: Hgb A1c MFr Bld: 6.1 % (ref 4.6–6.5)

## 2023-04-19 NOTE — Assessment & Plan Note (Signed)
 Will order carotid u/s as only occurs with dizziness  Heat to site, neck exercises, handout sent to [employer]

## 2023-04-19 NOTE — Assessment & Plan Note (Signed)
 Along with dizziness will order u/s carotid  Pending results.  If any sudden cp doe sob go to er.

## 2023-04-19 NOTE — Patient Instructions (Signed)
   Your imaging for carotid ultrasound Has been ordered at the following location.  Please call to schedule a time and date that would work for you.   [address], [place] Phone [phone],  8-430 pm    ------------------------------------ Stop hydrochlorothiazide  Continue olmesartan  Start monitoring your blood pressure daily, around the same time of day, for the next 2-3 weeks.  Ensure that you have rested for 30 minutes prior to checking your blood pressure. Record your readings and bring them to your next visit.  If you feel dizzy, clammy, jittery, check blood sugar ------------------------------------

## 2023-04-19 NOTE — Assessment & Plan Note (Addendum)
 Suspect incidental likely dermatitis  Low suspicion shingles, not painful no nerve pain Pcp to eval next f/u

## 2023-04-19 NOTE — Progress Notes (Signed)
 Established Patient Office Visit  Subjective:   Patient ID: Seth Larsen, male    DOB: 1960/05/04  Age: 63 y.o. MRN: [id number]  CC:  Chief Complaint  Patient presents with   Hypotension    HPI: Seth Larsen is a 63 y.o. male presenting on 04/19/2023 for Hypotension  Yesterday called in with c/o dizziness that started yesterday am with white spots in his vision that happened at work. It was so bad he knew he couldn't drive because he couldn't barely see'  He does state this has occurred in the past. He has noticed at times he has low bp readings. He did have someone check his blood pressure yesterday and it was 88/44. He denies cp palp and or sob. He does state sometimes when these episodes occur he notices neck tightness and tenderness where he feels if he had a good massage it would feel better. He does feel increased lightheaded and dizziness when bending over and standing up. He did not check his sugar when this happened.he denies sinus pressure and or nasal congestion. No ear fullness or pain. He did also note bil ears he could feel his heartbeat in them yesterday but not since.he has been increasing his water intake.   Last eye exam one month ago, he states he did talk to the optometrist and he does have bil cataracts. He states he is negative for diabetic retinopathy  He takes hydrochlorothiazide 12.5 mg once daily as well as olmesartan 20 mg once daily.  Today he states he is feeling much better.   He is currently being treated for prostatitis that started to be treated two weeks ago , still on cipro 500 mg po bid x 28 days. He is feeling better in that regard.   DM2: he is on ozempic 2 mg weekly, metformin 1000 mg twice daily.       ROS: Negative unless specifically indicated above in HPI.   Relevant past medical history reviewed and updated as indicated.   Allergies and medications reviewed and updated.   Current Outpatient Medications:    atorvastatin (LIPITOR)  10 MG tablet, TAKE 1 TABLET BY MOUTH EVERY DAY FOR CHOLESTEROL, Disp: 90 tablet, Rfl: 0   ciprofloxacin (CIPRO) 500 MG tablet, Take 1 tablet (500 mg total) by mouth every 12 (twelve) hours for 28 days., Disp: 56 tablet, Rfl: 0   cyclobenzaprine (FLEXERIL) 5 MG tablet, Take 1 tablet (5 mg total) by mouth 3 (three) times daily as needed., Disp: 30 tablet, Rfl: 0   diclofenac (VOLTAREN) 75 MG EC tablet, TAKE 1 TABLET BY MOUTH TWICE A DAY, Disp: 50 tablet, Rfl: 2   DULoxetine (CYMBALTA) 30 MG capsule, Take 1 capsule (30 mg total) by mouth daily. For chronic pain., Disp: 14 capsule, Rfl: 0   DULoxetine (CYMBALTA) 60 MG capsule, Take 60 mg by mouth daily., Disp: , Rfl:    folic acid (FOLVITE) 1 MG tablet, Take 1 mg by mouth daily., Disp: , Rfl:    gabapentin (NEURONTIN) 600 MG tablet, Take 1 tablet (600 mg total) by mouth 2 (two) times daily. For pain., Disp: 180 tablet, Rfl: 2   hydrochlorothiazide (HYDRODIURIL) 12.5 MG tablet, TAKE 1 TABLET (12.5 MG TOTAL) BY MOUTH DAILY FOR BLOOD PRESSURE, Disp: 90 tablet, Rfl: 3   HYDROcodone-acetaminophen (NORCO/VICODIN) 5-325 MG tablet, Take 1-2 tablets by mouth every 6 (six) hours as needed for moderate pain (pain score 4-6)., Disp: 30 tablet, Rfl: 0   HYRIMOZ 40 MG/0.4ML SOAJ, Inject into  the skin every 14 (fourteen) days., Disp: , Rfl:    metFORMIN (GLUCOPHAGE) 1000 MG tablet, TAKE 1 TABLET (1,000 MG TOTAL) BY MOUTH 2 (TWO) TIMES DAILY WITH A MEAL. FOR DIABETES., Disp: 180 tablet, Rfl: 1   methotrexate 50 MG/2ML injection, Inject 0.8 mLs (20 mg total) as directed once a week on the same day each week., Disp: 2 mL, Rfl: 0   olmesartan (BENICAR) 20 MG tablet, TAKE 1 TABLET (20 MG TOTAL) BY MOUTH DAILY. FOR BLOOD PRESSURE., Disp: 90 tablet, Rfl: 2   Semaglutide, 2 MG/DOSE, (OZEMPIC, 2 MG/DOSE,) 8 MG/3ML SOPN, INJECT 2 MG AS DIRECTED ONCE A WEEK. FOR DIABETES., Disp: 9 mL, Rfl: 1   tamsulosin (FLOMAX) 0.4 MG CAPS capsule, TAKE 2 CAPSULES (0.8 MG TOTAL) BY MOUTH DAILY.  FOR URINARY FREQUNCY, Disp: 180 capsule, Rfl: 0   traMADol (ULTRAM) 50 MG tablet, Take by mouth., Disp: , Rfl:   Allergies  Allergen Reactions   Lisinopril Other (See Comments)    Myalgias, urinary frequency    Objective:   BP 118/62 (BP Location: Right Arm, Patient Position: Sitting, Cuff Size: Large)   Pulse 78   Temp 98.3 F (36.8 C) (Temporal)   Ht 5\' 7"  (1.702 m)   Wt 258 lb (117 kg)   SpO2 98%   BMI 40.41 kg/m    Physical Exam Vitals reviewed.  Constitutional:      General: He is not in acute distress.    Appearance: Normal appearance. He is obese. He is not ill-appearing, toxic-appearing or diaphoretic.  HENT:     Head: Normocephalic.     Right Ear: Tympanic membrane normal.     Left Ear: Tympanic membrane normal.     Ears:     Comments: Left inner ear canal with a few inner ear blisters no redness, skin colored, non tender.     Nose: Nose normal.     Mouth/Throat:     Mouth: Mucous membranes are moist.  Eyes:     Pupils: Pupils are equal, round, and reactive to light.  Cardiovascular:     Rate and Rhythm: Normal rate and regular rhythm.  Pulmonary:     Effort: Pulmonary effort is normal.     Breath sounds: Normal breath sounds. No wheezing.  Musculoskeletal:        General: Normal range of motion.     Cervical back: Normal range of motion.  Neurological:     General: No focal deficit present.     Mental Status: He is alert and oriented to person, place, and time. Mental status is at baseline.  Psychiatric:        Mood and Affect: Mood normal.        Behavior: Behavior normal.        Thought Content: Thought content normal.        Judgment: Judgment normal.   Orthostatic  Laying 120/64 pulse 72 Standing 110/60 pulse 77 Sitting 116/60 pulse 83  Assessment & Plan:  Acute bacterial prostatitis Assessment & Plan: Cont until complete with antibx duration   Orders: -     CBC with Differential/Platelet  Dizziness -     Vitamin B12 -     TSH -      US Carotid Bilateral; Future  Change in vision Assessment & Plan: Continue f/u with ophthalmologist  If any sudden change of vision again need more urgent eval  Orders: -     TSH  Hypotension due to drugs  Type 2 diabetes mellitus  with hyperglycemia, without long-term current use of insulin (HCC) -     Basic metabolic panel -     Hemoglobin A1c -     Microalbumin / creatinine urine ratio  Pulsatile tinnitus of both ears Assessment & Plan: Along with dizziness will order u/s carotid  Pending results.  If any sudden cp doe sob go to er.     Neck tightness Assessment & Plan: Will order carotid u/s as only occurs with dizziness  Heat to site, neck exercises, handout sent to mychart   Essential hypertension Assessment & Plan: Hypotensive trend Stop hydrochlorothiazide this time may be adding to dehydration and kidney stress which could potentially decrease blood pressure. Increase water intake throughout the day, no more than 90 oz daily. Will assess bmp to evaluate kidneys and check potassium status  Pt advised of the following:  Continue medication as prescribed. Monitor blood pressure periodically and/or when you feel symptomatic. Goal is <130/90 on average. Ensure that you have rested for 30 minutes prior to checking your blood pressure. Record your readings and bring them to your next visit if necessary.work on a low sodium diet.    Ear lesion Assessment & Plan: Suspect incidental likely dermatitis  Low suspicion shingles, not painful no nerve pain Pcp to eval next f/u      Follow up plan: Return in about 2 weeks (around 05/03/2023) for f/u blood pressure with pcp.  Mort Sawyers, FNP

## 2023-04-19 NOTE — Assessment & Plan Note (Signed)
 Continue f/u with ophthalmologist  If any sudden change of vision again need more urgent eval

## 2023-04-19 NOTE — Assessment & Plan Note (Signed)
 Hypotensive trend Stop hydrochlorothiazide this time may be adding to dehydration and kidney stress which could potentially decrease blood pressure. Increase water intake throughout the day, no more than 90 oz daily. Will assess bmp to evaluate kidneys and check potassium status  Pt advised of the following:  Continue medication as prescribed. Monitor blood pressure periodically and/or when you feel symptomatic. Goal is <130/90 on average. Ensure that you have rested for 30 minutes prior to checking your blood pressure. Record your readings and bring them to your next visit if necessary.work on a low sodium diet.

## 2023-04-19 NOTE — Progress Notes (Signed)
 I'd recommend just repeating microalbumin at your visit. I did it bc he was here in office and due but looks like ratio wasn't computed.  Monocytes elevated, has had prostatitis for a few weeks, being treated. Advise possible repeat x one month?

## 2023-04-19 NOTE — Assessment & Plan Note (Signed)
 Cont until complete with antibx duration

## 2023-04-24 ENCOUNTER — Other Ambulatory Visit: Payer: Self-pay | Admitting: Podiatry

## 2023-04-24 ENCOUNTER — Telehealth: Payer: Self-pay | Admitting: Primary Care

## 2023-04-24 NOTE — Telephone Encounter (Signed)
 The address [address] [place], [place] comes up as [facility] - they do not do Ultrasounds.   I called patient and stated that he would need to contact his insurance and ask them who is in network, he states that they are the ones that gave him the address, he states that he cannot remember what name they mentioned. I advised that I would need to know the name not just the address in order to best handle processing his [place].   He states that he will call back and let us know

## 2023-04-24 NOTE — Telephone Encounter (Signed)
 Copied from CRM [id number]. Topic: Clinical - Request for Lab/Test Order >> Apr 24, 2023  9:01 AM Orinda Kenner C wrote: Reason for CRM: Patient was seen last with NP, Dugal was advised to have an ultrasound carotid bilateral in [place] imaging, but that is out of [facility]. Patient's wife texted him that they can go to [address] [place], [place] # [phone], patient does not know the name of this location. Patient is asking for help to find an imaging location in network with his insurance. Please advise and call back [phone].

## 2023-04-26 ENCOUNTER — Other Ambulatory Visit: Payer: PRIVATE HEALTH INSURANCE

## 2023-04-27 ENCOUNTER — Ambulatory Visit (HOSPITAL_BASED_OUTPATIENT_CLINIC_OR_DEPARTMENT_OTHER)
Admission: RE | Admit: 2023-04-27 | Discharge: 2023-04-27 | Disposition: A | Discharge status: Home / Self Care | Payer: PRIVATE HEALTH INSURANCE | Source: Ambulatory Visit | Attending: Family | Admitting: Family

## 2023-04-27 DIAGNOSIS — R42 Dizziness and giddiness: Secondary | ICD-10-CM | POA: Insufficient documentation

## 2023-04-30 ENCOUNTER — Encounter: Payer: Self-pay | Admitting: Family

## 2023-05-01 ENCOUNTER — Ambulatory Visit (INDEPENDENT_AMBULATORY_CARE_PROVIDER_SITE_OTHER): Payer: PRIVATE HEALTH INSURANCE

## 2023-05-01 DIAGNOSIS — E538 Deficiency of other specified B group vitamins: Secondary | ICD-10-CM | POA: Diagnosis not present

## 2023-05-01 MED ORDER — CYANOCOBALAMIN 1000 MCG/ML IJ SOLN
1000.0000 ug | Freq: Once | INTRAMUSCULAR | Status: AC
  Administered 2023-05-01: 1000 ug via INTRAMUSCULAR

## 2023-05-01 NOTE — Progress Notes (Signed)
 Per orders of Mayra Reel, DPN AGNP-C, injection of vitamin b 12 given by Lewanda Rife in left deltoid. Patient tolerated injection well. Patient will make appointment for 1 month.

## 2023-05-02 ENCOUNTER — Telehealth: Payer: Self-pay

## 2023-05-02 NOTE — Telephone Encounter (Signed)
 Unable to reach [place]. Left voicemail to return call to our office.

## 2023-05-02 NOTE — Telephone Encounter (Signed)
 Copied from CRM [id number]. Topic: General - Other >> May 01, 2023  5:42 PM Eunice Blase wrote: Reason for CRM: Received call from [employer] per [facility] ph: [phone] fax: [phone] regarding [employer] stated manager Vernona Rieger to review [employer] insurance. Please provide Revonda Standard with an update.

## 2023-05-03 NOTE — Telephone Encounter (Signed)
 Unable to reach [place]. Left voicemail to return call to our office.

## 2023-05-07 NOTE — Telephone Encounter (Signed)
 Unable to reach [place]. Left voicemail to return call to our office.   3rd attempt, will await for Revonda Standard to return call to office.

## 2023-05-16 ENCOUNTER — Ambulatory Visit: Payer: PRIVATE HEALTH INSURANCE | Admitting: Primary Care

## 2023-05-23 ENCOUNTER — Ambulatory Visit: Payer: No Typology Code available for payment source | Admitting: Primary Care

## 2023-05-23 ENCOUNTER — Encounter: Payer: Self-pay | Admitting: Primary Care

## 2023-05-23 VITALS — BP 144/72 | HR 67 | Temp 97.5°F | Ht 67.0 in | Wt 258.0 lb

## 2023-05-23 DIAGNOSIS — G4733 Obstructive sleep apnea (adult) (pediatric): Secondary | ICD-10-CM | POA: Diagnosis not present

## 2023-05-23 DIAGNOSIS — E1165 Type 2 diabetes mellitus with hyperglycemia: Secondary | ICD-10-CM | POA: Diagnosis not present

## 2023-05-23 DIAGNOSIS — Z125 Encounter for screening for malignant neoplasm of prostate: Secondary | ICD-10-CM | POA: Diagnosis not present

## 2023-05-23 DIAGNOSIS — Z23 Encounter for immunization: Secondary | ICD-10-CM | POA: Diagnosis not present

## 2023-05-23 DIAGNOSIS — M549 Dorsalgia, unspecified: Secondary | ICD-10-CM

## 2023-05-23 DIAGNOSIS — M069 Rheumatoid arthritis, unspecified: Secondary | ICD-10-CM

## 2023-05-23 DIAGNOSIS — Z0000 Encounter for general adult medical examination without abnormal findings: Secondary | ICD-10-CM

## 2023-05-23 DIAGNOSIS — Z7985 Long-term (current) use of injectable non-insulin antidiabetic drugs: Secondary | ICD-10-CM | POA: Diagnosis not present

## 2023-05-23 DIAGNOSIS — E785 Hyperlipidemia, unspecified: Secondary | ICD-10-CM

## 2023-05-23 DIAGNOSIS — R29898 Other symptoms and signs involving the musculoskeletal system: Secondary | ICD-10-CM

## 2023-05-23 DIAGNOSIS — M79671 Pain in right foot: Secondary | ICD-10-CM

## 2023-05-23 DIAGNOSIS — G8929 Other chronic pain: Secondary | ICD-10-CM

## 2023-05-23 DIAGNOSIS — R351 Nocturia: Secondary | ICD-10-CM

## 2023-05-23 DIAGNOSIS — I10 Essential (primary) hypertension: Secondary | ICD-10-CM

## 2023-05-23 LAB — MICROALBUMIN / CREATININE URINE RATIO
Creatinine,U: 72.2 mg/dL
Microalb Creat Ratio: 28.4 mg/g (ref 0.0–30.0)
Microalb, Ur: 2.1 mg/dL — ABNORMAL HIGH (ref 0.0–1.9)

## 2023-05-23 LAB — LIPID PANEL
Cholesterol: 148 mg/dL (ref 0–200)
HDL: 53.6 mg/dL (ref >39.00)
LDL Cholesterol: 77 mg/dL (ref 0–99)
NonHDL: 94.14
Total CHOL/HDL Ratio: 3
Triglycerides: 86 mg/dL (ref 0.0–149.0)
VLDL: 17.2 mg/dL (ref 0.0–40.0)

## 2023-05-23 LAB — PSA: PSA: 0.98 ng/mL (ref 0.10–4.00)

## 2023-05-23 MED ORDER — OLMESARTAN MEDOXOMIL 40 MG PO TABS: 40.0000 mg | ORAL_TABLET | Freq: Every day | ORAL | 3 refills | Status: AC

## 2023-05-23 MED ORDER — METFORMIN HCL ER 500 MG PO TB24: 500.0000 mg | ORAL_TABLET | Freq: Every day | ORAL | 1 refills | Status: DC

## 2023-05-23 NOTE — Assessment & Plan Note (Signed)
 Above goal today.  Increase olmesartan to 40 mg daily. Remain off HCTZ 12.5 mg daily.  He will monitor blood pressure at home and report readings.

## 2023-05-23 NOTE — Patient Instructions (Addendum)
 Stop by the lab prior to leaving today. I will notify you of your results once received.   We increased your dose of olmesartan blood pressure medicine to 40 mg once daily.  I sent a new prescription to your pharmacy.  Remain off hydrochlorothiazide for blood pressure.  We reduced your metformin.  Take metformin ER 500 mg once daily with breakfast for diabetes.   Schedule a lab appointment to recheck your A1c in 3 months.  Please schedule a follow up visit for 6 months for a diabetes check.  It was a pleasure to see you today!

## 2023-05-23 NOTE — Assessment & Plan Note (Signed)
 Not using CPAP due to odd working hours.

## 2023-05-23 NOTE — Progress Notes (Signed)
 Subjective:    Patient ID: Seth Larsen, male    DOB: 02-Mar-1960, 63 y.o.   MRN: [id number]  HPI  Seth Larsen is a very pleasant 63 y.o. male who presents today for complete physical and follow up of chronic conditions.  Immunizations: -Tetanus: Completed in 2017 -Influenza: Completed last season  -Shingles: Completed Shingrix series -Pneumonia: Completed in 2017  Diet: Fair diet.  Exercise: No regular exercise.  Eye exam: Completes annually  Dental exam: Completes annually    Colonoscopy: Completed in 2024, due 2031  PSA: Due  BP Readings from Last 3 Encounters:  05/23/23 (!) 144/72  04/19/23 118/62  03/30/23 (!) 148/66         Review of Systems  Constitutional:  Negative for unexpected weight change.  HENT:  Negative for rhinorrhea.   Respiratory:  Negative for cough and shortness of breath.   Cardiovascular:  Negative for chest pain.  Gastrointestinal:  Negative for constipation and diarrhea.  Genitourinary:  Negative for difficulty urinating.  Musculoskeletal:  Negative for arthralgias and myalgias.  Skin:  Negative for rash.  Allergic/Immunologic: Negative for environmental allergies.  Neurological:  Negative for dizziness and headaches.  Psychiatric/Behavioral:  The patient is not nervous/anxious.          Past Medical History:  Diagnosis Date   Acute bacterial prostatitis 03/30/2023   Acute on chronic low back pain 12/06/2022   Arthritis    COVID-19 virus infection 08/27/2020   Dizziness 04/19/2023   Ear lesion 04/19/2023   Hypertension    Polydipsia 03/26/2023   Type 2 diabetes mellitus (HCC)    URI (upper respiratory infection) 03/04/2020    Social History   Socioeconomic History   Marital status: Married    Spouse name: Not on file   Number of children: Not on file   Years of education: Not on file   Highest education level: Not on file  Occupational History   Not on file  Tobacco Use   Smoking status: Never   Smokeless  tobacco: Current    Types: Chew  Vaping Use   Vaping status: Never Used  Substance and Sexual Activity   Alcohol use: No    Alcohol/week: 0.0 standard drinks of alcohol   Drug use: No   Sexual activity: Not on file  Other Topics Concern   Not on file  Social History Narrative   Married.   Works at numerous occupations.    Enjoys tending to his animals.    Social Drivers of Corporate investment banker Strain: Not on file  Food Insecurity: Not on file  Transportation Needs: Not on file  Physical Activity: Not on file  Stress: Not on file  Social Connections: Not on file  Intimate Partner Violence: Not on file    Past Surgical History:  Procedure Laterality Date   BACK SURGERY     CARPAL TUNNEL RELEASE     KNEE SURGERY     TOTAL KNEE ARTHROPLASTY      Family History  Problem Relation Age of Onset   Hypertension Mother    Breast cancer Mother    Hypertension Father    Alzheimer's disease Father    Breast cancer Sister    Diabetes Brother    Colon cancer Neg Hx    Colon polyps Neg Hx    Esophageal cancer Neg Hx    Rectal cancer Neg Hx    Stomach cancer Neg Hx     Allergies  Allergen Reactions  Lisinopril Other (See Comments)    Myalgias, urinary frequency    Current Outpatient Medications on File Prior to Visit  Medication Sig Dispense Refill   Adalimumab-atto (AMJEVITA) 40 MG/0.4ML SOAJ Inject 40 mg into the skin every 14 (fourteen) days.     atorvastatin (LIPITOR) 10 MG tablet TAKE 1 TABLET BY MOUTH EVERY DAY FOR CHOLESTEROL 90 tablet 0   cyclobenzaprine (FLEXERIL) 5 MG tablet Take 1 tablet (5 mg total) by mouth 3 (three) times daily as needed. 30 tablet 0   diclofenac (VOLTAREN) 75 MG EC tablet TAKE 1 TABLET BY MOUTH TWICE A DAY 50 tablet 2   DULoxetine (CYMBALTA) 60 MG capsule Take 60 mg by mouth daily.     folic acid (FOLVITE) 1 MG tablet Take 1 mg by mouth daily.     gabapentin (NEURONTIN) 600 MG tablet Take 1 tablet (600 mg total) by mouth 2 (two)  times daily. For pain. 180 tablet 2   methotrexate 50 MG/2ML injection Inject 0.8 mLs (20 mg total) as directed once a week on the same day each week. 2 mL 0   Semaglutide, 2 MG/DOSE, (OZEMPIC, 2 MG/DOSE,) 8 MG/3ML SOPN INJECT 2 MG AS DIRECTED ONCE A WEEK. FOR DIABETES. 9 mL 1   tamsulosin (FLOMAX) 0.4 MG CAPS capsule TAKE 2 CAPSULES (0.8 MG TOTAL) BY MOUTH DAILY. FOR URINARY FREQUNCY 180 capsule 0   No current facility-administered medications on file prior to visit.    BP (!) 144/72   Pulse 67   Temp (!) 97.5 F (36.4 C) (Temporal)   Ht 5\' 7"  (1.702 m)   Wt 258 lb (117 kg)   SpO2 95%   BMI 40.41 kg/m  Objective:   Physical Exam HENT:     Right Ear: Tympanic membrane and ear canal normal.     Left Ear: Tympanic membrane and ear canal normal.  Eyes:     Pupils: Pupils are equal, round, and reactive to light.  Cardiovascular:     Rate and Rhythm: Normal rate and regular rhythm.  Pulmonary:     Effort: Pulmonary effort is normal.     Breath sounds: Normal breath sounds.  Abdominal:     General: Bowel sounds are normal.     Palpations: Abdomen is soft.     Tenderness: There is no abdominal tenderness.  Musculoskeletal:        General: Normal range of motion.     Cervical back: Neck supple.  Skin:    General: Skin is warm and dry.  Neurological:     Mental Status: He is alert and oriented to person, place, and time.     Cranial Nerves: No cranial nerve deficit.     Deep Tendon Reflexes:     Reflex Scores:      Patellar reflexes are 2+ on the right side and 2+ on the left side. Psychiatric:        Mood and Affect: Mood normal.           Assessment & Plan:  Preventative health care Assessment & Plan: Prevnar 20 provided today Colonoscopy UTD, due 2031 PSA due and pending.  Discussed the importance of a healthy diet and regular exercise in order for weight loss, and to reduce the risk of further co-morbidity.  Exam stable. Labs pending.  Follow up in 1 year  for repeat physical.    Type 2 diabetes mellitus with hyperglycemia, without long-term current use of insulin (HCC) Assessment & Plan: Controlled with recent A1C of 6.1  today.   Continue Ozempic 2 mg weekly. Reduce metformin to XR 500 mg daily.  Repeat A1C in 3 months.  Follow up in 6 months.   Orders: -     metFORMIN HCl ER; Take 1 tablet (500 mg total) by mouth daily with breakfast. for diabetes.  Dispense: 90 tablet; Refill: 1 -     Microalbumin / creatinine urine ratio  OSA (obstructive sleep apnea) Assessment & Plan: Not using CPAP due to odd working hours.   Rheumatoid arthritis, involving unspecified site, unspecified whether rheumatoid factor present [facility]) Assessment & Plan: Following with rheumatology. Reviewed office notes from January 2025  Continue Amjevita 40 mg every 2 weeks and methotrexate 20 mg once weekly. Continue folic acid daily.     Chronic back pain, unspecified back location, unspecified back pain laterality Assessment & Plan: Stable.  Continue Cymbalta 60 mg daily. Continue gabapentin 600 mg BID. Continue Flexeril 5 mg PRN   Essential hypertension Assessment & Plan: Above goal today.  Increase olmesartan to 40 mg daily. Remain off HCTZ 12.5 mg daily.  He will monitor blood pressure at home and report readings.  Orders: -     Olmesartan Medoxomil; Take 1 tablet (40 mg total) by mouth daily. for blood pressure.  Dispense: 90 tablet; Refill: 3  Screening for prostate cancer -     PSA  Hyperlipidemia, unspecified hyperlipidemia type Assessment & Plan: Repeat lipid panel pending.  Continue atorvastatin 10 mg daily.  Orders: -     Lipid panel  Chronic pain in right foot Assessment & Plan: Following with podiatry, continue with injections PRN   Neck tightness Assessment & Plan: Reviewed carotid ultrasound which shows minimal plaque buildup. Continue atorvastatin 10 mg daily.   Nocturia Assessment &  Plan: Improved.  Continue tamsulosin 0.8 mg daily.         Metztli Sachdev K Shelton Soler, NP

## 2023-05-23 NOTE — Assessment & Plan Note (Signed)
 Following with rheumatology. Reviewed office notes from January 2025  Continue Amjevita 40 mg every 2 weeks and methotrexate 20 mg once weekly. Continue folic acid daily.

## 2023-05-23 NOTE — Assessment & Plan Note (Signed)
 Stable.  Continue Cymbalta 60 mg daily. Continue gabapentin 600 mg BID. Continue Flexeril 5 mg PRN

## 2023-05-23 NOTE — Assessment & Plan Note (Signed)
Improved.  Continue tamsulosin 0.8 mg daily.

## 2023-05-23 NOTE — Assessment & Plan Note (Signed)
 Prevnar 20 provided today Colonoscopy UTD, due 2031 PSA due and pending.  Discussed the importance of a healthy diet and regular exercise in order for weight loss, and to reduce the risk of further co-morbidity.  Exam stable. Labs pending.  Follow up in 1 year for repeat physical.

## 2023-05-23 NOTE — Assessment & Plan Note (Signed)
 Controlled with recent A1C of 6.1 today.   Continue Ozempic 2 mg weekly. Reduce metformin to XR 500 mg daily.  Repeat A1C in 3 months.  Follow up in 6 months.

## 2023-05-23 NOTE — Assessment & Plan Note (Signed)
 Following with podiatry, continue with injections PRN

## 2023-05-23 NOTE — Assessment & Plan Note (Signed)
 Reviewed carotid ultrasound which shows minimal plaque buildup. Continue atorvastatin 10 mg daily.

## 2023-05-23 NOTE — Assessment & Plan Note (Signed)
 Repeat lipid panel pending.  Continue atorvastatin 10 mg daily.

## 2023-05-26 ENCOUNTER — Other Ambulatory Visit: Payer: Self-pay | Admitting: Primary Care

## 2023-05-26 DIAGNOSIS — R351 Nocturia: Secondary | ICD-10-CM

## 2023-05-26 DIAGNOSIS — E785 Hyperlipidemia, unspecified: Secondary | ICD-10-CM

## 2023-06-05 ENCOUNTER — Ambulatory Visit (INDEPENDENT_AMBULATORY_CARE_PROVIDER_SITE_OTHER): Payer: PRIVATE HEALTH INSURANCE

## 2023-06-05 DIAGNOSIS — E538 Deficiency of other specified B group vitamins: Secondary | ICD-10-CM

## 2023-06-05 MED ORDER — CYANOCOBALAMIN 1000 MCG/ML IJ SOLN
1000.0000 ug | Freq: Once | INTRAMUSCULAR | Status: AC
  Administered 2023-06-05: 1000 ug via INTRAMUSCULAR

## 2023-06-05 NOTE — Progress Notes (Signed)
 Per orders of Aneta Bar, DPN AGNP-C, injection of B-12 given by Rona Cobia in right deltoid. Patient tolerated injection well. Patient will make appointment for 1 month. For one more injection. Continue oral B-12 after.

## 2023-06-09 ENCOUNTER — Other Ambulatory Visit: Payer: Self-pay | Admitting: Primary Care

## 2023-06-09 DIAGNOSIS — E1165 Type 2 diabetes mellitus with hyperglycemia: Secondary | ICD-10-CM

## 2023-06-14 ENCOUNTER — Other Ambulatory Visit: Payer: Self-pay | Admitting: Primary Care

## 2023-06-14 DIAGNOSIS — G8929 Other chronic pain: Secondary | ICD-10-CM

## 2023-06-14 NOTE — Telephone Encounter (Unsigned)
 Copied from CRM [id number]. Topic: Clinical - Medication Refill >> Jun 14, 2023  9:37 AM Alyse July wrote: Medication: gabapentin  (NEURONTIN ) 600 MG tablet  Has the patient contacted their pharmacy? Yes (Agent: If no, request that the patient contact the pharmacy for the refill. If patient does not wish to contact the pharmacy document the reason why and proceed with request.) (Agent: If yes, when and what did the pharmacy advise?)  This is the patient's preferred pharmacy:  [facility] [phone] [facility] [place] - [address] [address] Phone: [phone] Fax: [phone]  Is this the correct pharmacy for this prescription? Yes If no, delete pharmacy and type the correct one.   Has the prescription been filled recently? No  Is the patient out of the medication? Yes  Has the patient been seen for an appointment in the last year OR does the patient have an upcoming appointment? Yes  Can we respond through MyChart? Yes  Agent: Please be advised that Rx refills may take up to 3 business days. We ask that you follow-up with your pharmacy.

## 2023-06-15 MED ORDER — GABAPENTIN 600 MG PO TABS: 600.0000 mg | ORAL_TABLET | Freq: Two times a day (BID) | ORAL | 2 refills | Status: DC

## 2023-06-21 ENCOUNTER — Other Ambulatory Visit: Payer: Self-pay | Admitting: Primary Care

## 2023-06-21 DIAGNOSIS — I10 Essential (primary) hypertension: Secondary | ICD-10-CM

## 2023-07-05 ENCOUNTER — Telehealth: Payer: Self-pay | Admitting: Primary Care

## 2023-07-05 ENCOUNTER — Ambulatory Visit (INDEPENDENT_AMBULATORY_CARE_PROVIDER_SITE_OTHER)

## 2023-07-05 DIAGNOSIS — E538 Deficiency of other specified B group vitamins: Secondary | ICD-10-CM

## 2023-07-05 MED ORDER — CYANOCOBALAMIN 1000 MCG/ML IJ SOLN
1000.0000 ug | Freq: Once | INTRAMUSCULAR | Status: AC
  Administered 2023-07-05: 1000 ug via INTRAMUSCULAR

## 2023-07-05 NOTE — Telephone Encounter (Signed)
 Pt had nv today for his 3rd b12 inj. Pt asked does he need any further inj or is he finished? Pt states he believed he was told he'd only need 3 sessions. Please advise. Call back # [phone]

## 2023-07-05 NOTE — Progress Notes (Signed)
Per orders of Mayra Reel, DPN AGNP-C, injection of B-12 given by Nanci Pina in left deltoid. Patient tolerated injection well.

## 2023-07-05 NOTE — Telephone Encounter (Signed)
 Called and advised patient of Seth Larsen message. He has lab appt scheduled for July, will keep that appt and recheck b12 then.

## 2023-07-05 NOTE — Telephone Encounter (Signed)
 Make sure he's taking B12 1000 mcg OTC once daily.  Recommend B12 lab in 4 weeks. Lab only appt is fine. No further injections at this time.

## 2023-07-06 ENCOUNTER — Other Ambulatory Visit: Payer: Self-pay | Admitting: Podiatry

## 2023-07-25 ENCOUNTER — Ambulatory Visit: Payer: PRIVATE HEALTH INSURANCE | Admitting: Podiatry

## 2023-07-27 ENCOUNTER — Ambulatory Visit (INDEPENDENT_AMBULATORY_CARE_PROVIDER_SITE_OTHER): Payer: PRIVATE HEALTH INSURANCE | Admitting: Podiatry

## 2023-07-27 ENCOUNTER — Encounter: Payer: Self-pay | Admitting: Podiatry

## 2023-07-27 DIAGNOSIS — M7751 Other enthesopathy of right foot: Secondary | ICD-10-CM

## 2023-07-27 MED ORDER — TRIAMCINOLONE ACETONIDE 10 MG/ML IJ SUSP
10.0000 mg | Freq: Once | INTRAMUSCULAR | Status: AC
  Administered 2023-07-27: 10 mg via INTRA_ARTICULAR

## 2023-07-27 MED ORDER — TRAMADOL HCL 50 MG PO TABS: 50.0000 mg | ORAL_TABLET | Freq: Three times a day (TID) | ORAL | 2 refills | Status: DC

## 2023-07-30 NOTE — Progress Notes (Signed)
 Subjective:   Patient ID: Seth Larsen, male   DOB: 63 y.o.   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

## 2023-07-31 ENCOUNTER — Other Ambulatory Visit: Payer: Self-pay | Admitting: Primary Care

## 2023-07-31 DIAGNOSIS — E538 Deficiency of other specified B group vitamins: Secondary | ICD-10-CM

## 2023-08-22 ENCOUNTER — Other Ambulatory Visit (INDEPENDENT_AMBULATORY_CARE_PROVIDER_SITE_OTHER)

## 2023-08-22 DIAGNOSIS — E538 Deficiency of other specified B group vitamins: Secondary | ICD-10-CM

## 2023-08-22 LAB — VITAMIN B12: Vitamin B-12: 695 pg/mL (ref 211–911)

## 2023-08-23 ENCOUNTER — Ambulatory Visit: Payer: Self-pay | Admitting: Primary Care

## 2023-09-05 ENCOUNTER — Other Ambulatory Visit: Payer: Self-pay | Admitting: Primary Care

## 2023-09-05 DIAGNOSIS — E1165 Type 2 diabetes mellitus with hyperglycemia: Secondary | ICD-10-CM

## 2023-09-14 ENCOUNTER — Other Ambulatory Visit: Payer: Self-pay | Admitting: Podiatry

## 2023-10-02 ENCOUNTER — Other Ambulatory Visit (HOSPITAL_COMMUNITY): Payer: Self-pay

## 2023-11-19 ENCOUNTER — Encounter: Payer: Self-pay | Admitting: Podiatry

## 2023-11-19 ENCOUNTER — Ambulatory Visit: Payer: PRIVATE HEALTH INSURANCE | Admitting: Podiatry

## 2023-11-19 DIAGNOSIS — M7751 Other enthesopathy of right foot: Secondary | ICD-10-CM | POA: Diagnosis not present

## 2023-11-19 MED ORDER — TRIAMCINOLONE ACETONIDE 10 MG/ML IJ SUSP
10.0000 mg | Freq: Once | INTRAMUSCULAR | Status: AC
  Administered 2023-11-19: 10 mg via INTRA_ARTICULAR

## 2023-11-19 NOTE — Progress Notes (Signed)
 Subjective:   Patient ID: Seth Larsen, male   DOB: 63 y.o.   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

## 2023-11-22 ENCOUNTER — Encounter: Payer: Self-pay | Admitting: Primary Care

## 2023-11-22 ENCOUNTER — Ambulatory Visit: Payer: Self-pay | Admitting: Primary Care

## 2023-11-22 ENCOUNTER — Ambulatory Visit: Admitting: Primary Care

## 2023-11-22 VITALS — BP 130/66 | HR 76 | Temp 97.8°F | Ht 67.0 in | Wt 263.0 lb

## 2023-11-22 DIAGNOSIS — Z7985 Long-term (current) use of injectable non-insulin antidiabetic drugs: Secondary | ICD-10-CM | POA: Diagnosis not present

## 2023-11-22 DIAGNOSIS — Z23 Encounter for immunization: Secondary | ICD-10-CM | POA: Diagnosis not present

## 2023-11-22 DIAGNOSIS — E1165 Type 2 diabetes mellitus with hyperglycemia: Secondary | ICD-10-CM

## 2023-11-22 LAB — POCT GLYCOSYLATED HEMOGLOBIN (HGB A1C): Hemoglobin A1C: 5.8 % — AB (ref 4.0–5.6)

## 2023-11-22 MED ORDER — METFORMIN HCL ER 500 MG PO TB24: 500.0000 mg | ORAL_TABLET | Freq: Every day | ORAL | 1 refills | Status: AC

## 2023-11-22 NOTE — Progress Notes (Signed)
 Subjective:    Patient ID: Seth Larsen, male    DOB: Mar 13, 1960, 63 y.o.   MRN: [id number]  Seth Larsen is a very pleasant 63 y.o. male with a history of hypertension, OSA, type 2 diabetes, rheumatoid arthritis, hyperlipidemia who presents today for follow-up of diabetes.  1) Type 2 Diabetes:  Current medications include: Ozempic  2 mg weekly, metformin  ER 500 mg daily.   He is checking his blood glucose 0 times daily.  Last A1C: 6.1 in March 2025, 5.8 today Last Eye Exam: UTD Last Foot Exam: Due Pneumonia Vaccination: 2025 Urine Microalbumin: Up-to-date Statin: Atorvastatin   Dietary changes since last visit: No changes in diet.    Exercise: Walking, active.   BP Readings from Last 3 Encounters:  11/22/23 130/66  05/23/23 (!) 144/72  04/19/23 118/62   Wt Readings from Last 3 Encounters:  11/22/23 263 lb (119.3 kg)  05/23/23 258 lb (117 kg)  04/19/23 258 lb (117 kg)     Review of Systems  Eyes:  Negative for visual disturbance.  Respiratory:  Negative for shortness of breath.   Cardiovascular:  Negative for chest pain.  Neurological:  Positive for numbness.         Past Medical History:  Diagnosis Date   Acute bacterial prostatitis 03/30/2023   Acute on chronic low back pain 12/06/2022   Arthritis    COVID-19 virus infection 08/27/2020   Dizziness 04/19/2023   Ear lesion 04/19/2023   Hypertension    Polydipsia 03/26/2023   Type 2 diabetes mellitus (HCC)    URI (upper respiratory infection) 03/04/2020    Social History   Socioeconomic History   Marital status: Married    Spouse name: Not on file   Number of children: Not on file   Years of education: Not on file   Highest education level: Not on file  Occupational History   Not on file  Tobacco Use   Smoking status: Never   Smokeless tobacco: Current    Types: Chew  Vaping Use   Vaping status: Never Used  Substance and Sexual Activity   Alcohol use: No    Alcohol/week: 0.0 standard  drinks of alcohol   Drug use: No   Sexual activity: Not on file  Other Topics Concern   Not on file  Social History Narrative   Married.   Works at numerous occupations.    Enjoys tending to his animals.    Social Drivers of Corporate investment banker Strain: Not on file  Food Insecurity: Not on file  Transportation Needs: Not on file  Physical Activity: Not on file  Stress: Not on file  Social Connections: Not on file  Intimate Partner Violence: Not on file    Past Surgical History:  Procedure Laterality Date   BACK SURGERY     CARPAL TUNNEL RELEASE     KNEE SURGERY     TOTAL KNEE ARTHROPLASTY      Family History  Problem Relation Age of Onset   Hypertension Mother    Breast cancer Mother    Hypertension Father    Alzheimer's disease Father    Breast cancer Sister    Diabetes Brother    Colon cancer Neg Hx    Colon polyps Neg Hx    Esophageal cancer Neg Hx    Rectal cancer Neg Hx    Stomach cancer Neg Hx     Allergies  Allergen Reactions   Lisinopril  Other (See Comments)    Myalgias, urinary  frequency    Current Outpatient Medications on File Prior to Visit  Medication Sig Dispense Refill   Adalimumab-atto (AMJEVITA) 40 MG/0.4ML SOAJ Inject 40 mg into the skin every 14 (fourteen) days.     atorvastatin  (LIPITOR) 10 MG tablet TAKE 1 TABLET BY MOUTH EVERY DAY FOR CHOLESTEROL 90 tablet 3   cyclobenzaprine  (FLEXERIL ) 5 MG tablet Take 1 tablet (5 mg total) by mouth 3 (three) times daily as needed. 30 tablet 0   diclofenac  (VOLTAREN ) 75 MG EC tablet TAKE 1 TABLET BY MOUTH TWICE A DAY 50 tablet 2   DULoxetine  (CYMBALTA ) 60 MG capsule Take 60 mg by mouth daily.     folic acid (FOLVITE) 1 MG tablet Take 1 mg by mouth daily.     gabapentin  (NEURONTIN ) 600 MG tablet Take 1 tablet (600 mg total) by mouth 2 (two) times daily. For pain. 180 tablet 2   methotrexate  50 MG/2ML injection Inject 0.8 mLs (20 mg total) as directed once a week on the same day each week. 2 mL 0    olmesartan  (BENICAR ) 40 MG tablet Take 1 tablet (40 mg total) by mouth daily. for blood pressure. 90 tablet 3   Semaglutide , 2 MG/DOSE, (OZEMPIC , 2 MG/DOSE,) 8 MG/3ML SOPN INJECT 2 MG AS DIRECTED ONCE A WEEK. FOR DIABETES. 2 mL 5   tamsulosin  (FLOMAX ) 0.4 MG CAPS capsule TAKE 2 CAPSULES (0.8 MG TOTAL) BY MOUTH DAILY. FOR URINARY FREQUNCY 180 capsule 3   traMADol  (ULTRAM ) 50 MG tablet Take 1 tablet (50 mg total) by mouth 3 (three) times daily. 90 tablet 2   No current facility-administered medications on file prior to visit.    BP 130/66   Pulse 76   Temp 97.8 F (36.6 C) (Temporal)   Ht 5' 7 (1.702 m)   Wt 263 lb (119.3 kg)   SpO2 96%   BMI 41.19 kg/m  Objective:   Physical Exam Cardiovascular:     Rate and Rhythm: Normal rate and regular rhythm.  Pulmonary:     Effort: Pulmonary effort is normal.     Breath sounds: Normal breath sounds.  Musculoskeletal:     Cervical back: Neck supple.  Skin:    General: Skin is warm and dry.  Neurological:     Mental Status: He is alert and oriented to person, place, and time.  Psychiatric:        Mood and Affect: Mood normal.     Physical Exam        Assessment & Plan:  Type 2 diabetes mellitus with hyperglycemia, without long-term current use of insulin (HCC) Assessment & Plan: Improved and controlled with A1C of 5.8 today!  We discussed the option to discontinue metformin  versus not. We both agreed to continue metformin  ER 500 mg daily. Continue Ozempic  2 mg weekly.  Foot exam today.  Follow-up in 6 months.  Orders: -     POCT glycosylated hemoglobin (Hb A1C) -     metFORMIN  HCl ER; Take 1 tablet (500 mg total) by mouth daily with breakfast. for diabetes.  Dispense: 90 tablet; Refill: 1    Assessment and Plan Assessment & Plan         Comer MARLA Gaskins, NP      History of Present Illness

## 2023-11-22 NOTE — Assessment & Plan Note (Signed)
 Improved and controlled with A1C of 5.8 today!  We discussed the option to discontinue metformin  versus not. We both agreed to continue metformin  ER 500 mg daily. Continue Ozempic  2 mg weekly.  Foot exam today.  Follow-up in 6 months.

## 2023-11-22 NOTE — Patient Instructions (Signed)
 Please schedule a physical to meet with me in 6 months.   It was a pleasure to see you today!

## 2023-11-25 ENCOUNTER — Other Ambulatory Visit: Payer: Self-pay | Admitting: Primary Care

## 2023-11-25 DIAGNOSIS — E1165 Type 2 diabetes mellitus with hyperglycemia: Secondary | ICD-10-CM

## 2023-11-29 ENCOUNTER — Other Ambulatory Visit: Payer: Self-pay | Admitting: Podiatry

## 2024-01-08 DIAGNOSIS — M0579 Rheumatoid arthritis with rheumatoid factor of multiple sites without organ or systems involvement: Secondary | ICD-10-CM | POA: Diagnosis not present

## 2024-01-08 DIAGNOSIS — R5382 Chronic fatigue, unspecified: Secondary | ICD-10-CM | POA: Diagnosis not present

## 2024-01-08 DIAGNOSIS — E669 Obesity, unspecified: Secondary | ICD-10-CM | POA: Diagnosis not present

## 2024-01-08 DIAGNOSIS — M25571 Pain in right ankle and joints of right foot: Secondary | ICD-10-CM | POA: Diagnosis not present

## 2024-01-08 DIAGNOSIS — M1991 Primary osteoarthritis, unspecified site: Secondary | ICD-10-CM | POA: Diagnosis not present

## 2024-01-08 DIAGNOSIS — Z6837 Body mass index (BMI) 37.0-37.9, adult: Secondary | ICD-10-CM | POA: Diagnosis not present

## 2024-01-08 DIAGNOSIS — M48061 Spinal stenosis, lumbar region without neurogenic claudication: Secondary | ICD-10-CM | POA: Diagnosis not present

## 2024-01-26 ENCOUNTER — Other Ambulatory Visit: Payer: Self-pay | Admitting: Podiatry

## 2024-02-27 ENCOUNTER — Telehealth: Payer: Self-pay

## 2024-02-27 ENCOUNTER — Other Ambulatory Visit (HOSPITAL_COMMUNITY): Payer: Self-pay

## 2024-02-27 NOTE — Telephone Encounter (Signed)
 Pharmacy Patient Advocate Encounter   Received notification from [facility] KEY that prior authorization for Ozempic  8 is required/requested.   Insurance verification completed.   The patient is insured through [facility] [employer].   Per test claim: PA required; PA submitted to above mentioned insurance via Latent Key/confirmation #/EOC [id number] Status is pending

## 2024-02-29 ENCOUNTER — Ambulatory Visit: Payer: Self-pay

## 2024-02-29 ENCOUNTER — Other Ambulatory Visit: Payer: Self-pay | Admitting: Primary Care

## 2024-02-29 DIAGNOSIS — G8929 Other chronic pain: Secondary | ICD-10-CM

## 2024-02-29 MED ORDER — CYCLOBENZAPRINE HCL 5 MG PO TABS: 5.0000 mg | ORAL_TABLET | Freq: Three times a day (TID) | ORAL | 0 refills | Status: AC | PRN

## 2024-02-29 MED ORDER — TRAMADOL HCL 50 MG PO TABS: 50.0000 mg | ORAL_TABLET | Freq: Two times a day (BID) | ORAL | 0 refills | Status: AC | PRN

## 2024-02-29 NOTE — Telephone Encounter (Signed)
 Noted. Will send to pharmacy.

## 2024-02-29 NOTE — Telephone Encounter (Signed)
" °  FYI Only or Action Required?: Action required by provider: request for muscle relaxers/ pain pills.  Patient was last seen in primary care on 11/22/2023 by Gretta Comer POUR, NP.  Called Nurse Triage reporting Back Pain.  Symptoms began a week ago.  Interventions attempted: Rest, hydration, or home remedies.  Symptoms are: stable.  Triage Disposition: See PCP When Office is Open (Within 3 Days)  Patient/caregiver understands and will follow disposition?: No, wishes to speak with PCP  Reason for Triage: patient has bad muscle pain states pain level is about an 8. Trouble with movement. Back pain going into glutes and leg left side  Reason for Disposition  [1] MODERATE back pain (e.g., interferes with normal activities) AND [2] present > 3 days  Answer Assessment - Initial Assessment Questions Patient declines appt in office. Wants call back from MD   1. ONSET: When did the pain begin? (e.g., minutes, hours, days)     1.5 wk ago 2. LOCATION: Where does it hurt? (upper, mid or lower back)     Left but cheek 3. SEVERITY: How bad is the pain?  (e.g., Scale 1-10; mild, moderate, or severe)     8/10 4. PATTERN: Is the pain constant? (e.g., yes, no; constant, intermittent)      constant 5. RADIATION: Does the pain shoot into your legs or somewhere else?     Left leg 6. CAUSE:  What do you think is causing the back pain?      lifting 7. BACK OVERUSE:  Any recent lifting of heavy objects, strenuous work or exercise?     Yes 1.5 wks ago 8. MEDICINES: What have you taken so far for the pain? (e.g., nothing, acetaminophen , NSAIDS)      9. NEUROLOGIC SYMPTOMS: Do you have any weakness, numbness, or problems with bowel/bladder control?     denies 10. OTHER SYMPTOMS: Do you have any other symptoms? (e.g., fever, abdomen pain, burning with urination, blood in urine)       no  Protocols used: Back Pain-A-AH  "

## 2024-02-29 NOTE — Telephone Encounter (Signed)
 Spoke with patient and he needs the tramadol  currently.

## 2024-02-29 NOTE — Telephone Encounter (Signed)
 Noted.  Please call patient:  Let him know that I will refill his medications. Is he needing gabapentin  or tramadol  for pain? I will send the muscle relaxer now.

## 2024-03-04 ENCOUNTER — Other Ambulatory Visit (HOSPITAL_COMMUNITY): Payer: Self-pay

## 2024-03-04 NOTE — Telephone Encounter (Signed)
 Pharmacy Patient Advocate Encounter  Received notification from [employer] that Prior Authorization for Ozempic  (2 MG/DOSE) 8MG /3ML pen-injectors has been APPROVED from 02/27/2024 to 02/26/2025. Unable to obtain price due to refill too soon rejection, last fill date 02/27/2024 next available fill date02/12/2024.   PA #/Case ID/Reference #: [id number]

## 2024-03-06 ENCOUNTER — Other Ambulatory Visit (HOSPITAL_COMMUNITY): Payer: Self-pay

## 2024-03-06 ENCOUNTER — Other Ambulatory Visit: Payer: Self-pay | Admitting: Primary Care

## 2024-03-06 ENCOUNTER — Telehealth: Payer: Self-pay

## 2024-03-06 DIAGNOSIS — G8929 Other chronic pain: Secondary | ICD-10-CM

## 2024-03-06 NOTE — Telephone Encounter (Signed)
 Pharmacy Patient Advocate Encounter   Received notification from [facility] KEY that prior authorization for cyclobenzaprine  Hcl 5 is required/requested.   Insurance verification completed.   The patient is insured through [facility] ADVANTAGE/RX ADVANCE.   Per test claim: PA required; PA submitted to above mentioned insurance via Latent Key/confirmation #/EOC [id number] Status is pending

## 2024-05-22 ENCOUNTER — Encounter: Admitting: Primary Care
